# Patient Record
Sex: Female | Born: 1965 | Race: White | Hispanic: No | Marital: Married | Smoking: Never smoker
Health system: Southern US, Community
[De-identification: ages and names within clinical notes are randomized; demographics above are authoritative.]

## PROBLEM LIST (undated history)

## (undated) DIAGNOSIS — D869 Sarcoidosis, unspecified: Secondary | ICD-10-CM

## (undated) DIAGNOSIS — Z9889 Other specified postprocedural states: Secondary | ICD-10-CM

## (undated) DIAGNOSIS — T4145XA Adverse effect of unspecified anesthetic, initial encounter: Secondary | ICD-10-CM

## (undated) DIAGNOSIS — F419 Anxiety disorder, unspecified: Secondary | ICD-10-CM

## (undated) DIAGNOSIS — C801 Malignant (primary) neoplasm, unspecified: Secondary | ICD-10-CM

## (undated) DIAGNOSIS — T8859XA Other complications of anesthesia, initial encounter: Secondary | ICD-10-CM

## (undated) DIAGNOSIS — E039 Hypothyroidism, unspecified: Secondary | ICD-10-CM

## (undated) DIAGNOSIS — R112 Nausea with vomiting, unspecified: Secondary | ICD-10-CM

## (undated) DIAGNOSIS — K219 Gastro-esophageal reflux disease without esophagitis: Secondary | ICD-10-CM

## (undated) DIAGNOSIS — Z87442 Personal history of urinary calculi: Secondary | ICD-10-CM

## (undated) HISTORY — DX: Gastro-esophageal reflux disease without esophagitis: K21.9

## (undated) HISTORY — PX: NECK SURGERY: SHX720

## (undated) HISTORY — PX: TUBAL LIGATION: SHX77

## (undated) HISTORY — PX: SINUSOTOMY: SHX291

## (undated) HISTORY — PX: BLADDER SUSPENSION: SHX72

## (undated) HISTORY — DX: Anxiety disorder, unspecified: F41.9

## (undated) HISTORY — PX: ABDOMINAL HYSTERECTOMY: SHX81

## (undated) HISTORY — PX: OTHER SURGICAL HISTORY: SHX169

---

## 2009-06-18 ENCOUNTER — Emergency Department (HOSPITAL_COMMUNITY): Admission: EM | Admit: 2009-06-18 | Discharge: 2009-06-18 | Payer: Self-pay | Admitting: Emergency Medicine

## 2010-09-28 LAB — URINALYSIS, ROUTINE W REFLEX MICROSCOPIC
Glucose, UA: NEGATIVE mg/dL
Ketones, ur: NEGATIVE mg/dL
Nitrite: NEGATIVE
Specific Gravity, Urine: 1.024 (ref 1.005–1.030)
pH: 6 (ref 5.0–8.0)

## 2011-01-08 ENCOUNTER — Other Ambulatory Visit (HOSPITAL_COMMUNITY): Payer: Worker's Compensation

## 2011-01-11 ENCOUNTER — Inpatient Hospital Stay (HOSPITAL_COMMUNITY)
Admission: RE | Admit: 2011-01-11 | Discharge: 2011-01-12 | DRG: 473 | Disposition: A | Discharge status: Home / Self Care | Payer: Worker's Compensation | Source: Ambulatory Visit | Attending: Neurosurgery | Admitting: Neurosurgery

## 2011-01-11 ENCOUNTER — Ambulatory Visit (HOSPITAL_COMMUNITY): Payer: Worker's Compensation

## 2011-01-11 DIAGNOSIS — M47812 Spondylosis without myelopathy or radiculopathy, cervical region: Principal | ICD-10-CM | POA: Diagnosis present

## 2011-01-11 DIAGNOSIS — M5020 Other cervical disc displacement, unspecified cervical region: Secondary | ICD-10-CM | POA: Diagnosis present

## 2011-01-11 DIAGNOSIS — E669 Obesity, unspecified: Secondary | ICD-10-CM | POA: Diagnosis present

## 2011-01-11 LAB — CBC
HCT: 41.5 % (ref 36.0–46.0)
RBC: 4.59 MIL/uL (ref 3.87–5.11)
RDW: 12.8 % (ref 11.5–15.5)
WBC: 7.5 10*3/uL (ref 4.0–10.5)

## 2011-01-11 LAB — DIFFERENTIAL
Basophils Absolute: 0.1 10*3/uL (ref 0.0–0.1)
Eosinophils Relative: 4 % (ref 0–5)
Lymphocytes Relative: 21 % (ref 12–46)
Neutro Abs: 5.1 10*3/uL (ref 1.7–7.7)
Neutrophils Relative %: 68 % (ref 43–77)

## 2011-01-11 LAB — TYPE AND SCREEN: Antibody Screen: NEGATIVE

## 2011-01-11 LAB — SURGICAL PCR SCREEN: Staphylococcus aureus: NEGATIVE

## 2011-01-11 LAB — ABO/RH: ABO/RH(D): A POS

## 2011-01-14 NOTE — Op Note (Signed)
Tammy Wilson, VALLIN NO.:  [id number]  MEDICAL RECORD NO.:  [id number]  LOCATION:  3524                         FACILITY:  [facility]  PHYSICIAN:  Kathaleen Maser. Tammy Wilson, M.D.    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  PROCEDURE NOTE:  C5-6, C6-7 anterior cervical diskectomy and fusion with allograft and plating.  SURGEON:  Kathaleen Maser. Aizlyn Schifano, MD  ASSISTANTYetta Barre.  ANESTHESIA:  General endotracheal.  INDICATIONS:  Ms. Tammy Wilson in a 45 year old female with history of neck and right upper extremity pain, persistent weakness, failing conservative management.  Workup demonstrates evidence of a rightward C5- 6 and rightward C6-7 disk herniation with associated spondylosis causing compression of the exiting C6 and C7 nerve roots.  The patient has been counseled as to her options.  She decided to proceed with C5-6 and C6-7 anterior diskectomy and fusion with allograft and plating, hopes improving her symptoms.  DESCRIPTION OF PROCEDURE:  The patient was brought to the operating room, placed on the operating table in a supine position.  After adequate level of anesthesia was achieved, the patient was placed supine with the neck slightly extended and held in place with halter traction. The patient's anterior cervical region was prepped and draped sterilely. A 10 blade was used to make a curvilinear skin incision overlying the C6 vertebral level.  This was carried down sharply to the platysma.  The platysma then divided vertically and dissection proceeded along the medial border of the  sternocleidomastoid muscle and carotid sheath. Trachea and esophagus are mobilized, tracked towards the left. Prevertebral fascia stripped off the anterior spinal column.  Longus coli muscle identified bilaterally using electrocautery.  Deep self- retaining retractor was placed.  Intraoperative fluoroscopy was used, levels were confirmed.  Disk spaces at C5-6 and C6-7 then incised with a #15 blade in a rectangular fashion.  Wide disk space clean-out was then achieved using pituitary rongeurs, forward and backward angled Karlin curettes, Kerrison rongeurs, and high-speed drill.  All elements of disk were removed down to the level of the posterior annulus.  Microscope was then brought into the field and used throughout the remainder of the diskectomy. Microscope was brought to the right used at the remainder of diskectomy. The remaining aspects of the annulus and osteophytes were removed using high-speed drill down to the level of the posterior longitudinal ligament.  The posterior longitudinal ligament was then elevated and resected in piecemeal fashion using Kerrison rongeurs.  The underlying thecal sac was then identified.  A wide central decompression was performed by undercutting bodies of C5 and C6.  Decompression was then proceeded out each neural foramen.  Wide anterior foraminotomies were then performed along the course exiting C6 nerve roots bilaterally.  At this point a very thorough diskectomy was achieved.  There is no evidence of injury to thecal sac or nerve roots.  At this point, a very thorough depression  was achieved.  The patient tolerated this well and a decompression then performed at the C6-7 level and again in a similar fashion.  Finally, both levels were of significant disk herniation off to the right side at both C5-6 and C6-7.  At this point, a very thorough decompression had been achieved.  Gelfoam was placed topically for hemostasis, found to be good.  A 6-mm  cornerstone allograft wedge was then packed into place and recessed approximately 1 mm from the anterior cortical margin at both levels.  A 42-mm Atlantis anterior cervical plate was then placed over the C5, C6, and T7 levels.  This attached under fluoroscopic guidance using 13-mm variable angle screws to each at all three levels.  All six screws given final tightening and found to be solid within bone.  Locking screws engaged at all 3 levels.  Final images revealed good position of the bone grafts, hardware at proper operative level with normal alignment of spine.  The wound was then irrigated one final time.  Hemostasis was achieved with bipolar electrocautery.  Wound was then closed in typical fashion.  Steri-Strips and sterile dressings were applied.  There were no complications.  The patient tolerated the procedure well, and she returns to the recovery room postoperatively.          ______________________________ Kathaleen Maser Shelsey Rieth, M.D.     HAP/MEDQ  D:  01/11/2011  T:  01/12/2011  Job:  [id number]  Electronically Signed by Julio Sicks M.D. on 01/14/2011 11:47:41 PM

## 2014-08-16 DIAGNOSIS — N3281 Overactive bladder: Secondary | ICD-10-CM | POA: Insufficient documentation

## 2015-03-06 ENCOUNTER — Other Ambulatory Visit: Payer: Self-pay | Admitting: Surgery

## 2015-04-02 ENCOUNTER — Ambulatory Visit (HOSPITAL_COMMUNITY)
Admission: RE | Admit: 2015-04-02 | Discharge: 2015-04-02 | Disposition: A | Discharge status: Home / Self Care | Payer: BLUE CROSS/BLUE SHIELD | Source: Ambulatory Visit | Attending: Surgery | Admitting: Surgery

## 2015-04-02 ENCOUNTER — Encounter (HOSPITAL_COMMUNITY)
Admission: RE | Admit: 2015-04-02 | Discharge: 2015-04-02 | Disposition: A | Discharge status: Home / Self Care | Payer: BLUE CROSS/BLUE SHIELD | Source: Ambulatory Visit | Attending: Surgery | Admitting: Surgery

## 2015-04-02 ENCOUNTER — Other Ambulatory Visit: Payer: Self-pay

## 2015-04-02 DIAGNOSIS — Z01818 Encounter for other preprocedural examination: Secondary | ICD-10-CM | POA: Diagnosis not present

## 2015-04-02 DIAGNOSIS — Z981 Arthrodesis status: Secondary | ICD-10-CM | POA: Diagnosis not present

## 2015-04-02 DIAGNOSIS — R9431 Abnormal electrocardiogram [ECG] [EKG]: Secondary | ICD-10-CM | POA: Diagnosis not present

## 2015-04-02 DIAGNOSIS — K760 Fatty (change of) liver, not elsewhere classified: Secondary | ICD-10-CM | POA: Diagnosis not present

## 2015-04-02 DIAGNOSIS — Z01810 Encounter for preprocedural cardiovascular examination: Secondary | ICD-10-CM | POA: Insufficient documentation

## 2015-04-07 ENCOUNTER — Encounter: Payer: Self-pay | Admitting: Dietician

## 2015-04-07 ENCOUNTER — Encounter: Payer: BLUE CROSS/BLUE SHIELD | Attending: Surgery | Admitting: Dietician

## 2015-04-07 DIAGNOSIS — Z713 Dietary counseling and surveillance: Secondary | ICD-10-CM | POA: Insufficient documentation

## 2015-04-07 DIAGNOSIS — Z6841 Body Mass Index (BMI) 40.0 and over, adult: Secondary | ICD-10-CM | POA: Diagnosis not present

## 2015-04-07 NOTE — Patient Instructions (Signed)

## 2015-04-07 NOTE — Progress Notes (Signed)
  Pre-Op Assessment Visit:  Pre-Operative Sleeve Gastrectomy Surgery  Medical Nutrition Therapy:  Appt start time: 1120   End time:  0929.  Patient was seen on 04/07/2015 for Pre-Operative Nutrition Assessment. Assessment and letter of approval faxed to [facility] Bariatric Surgery Program coordinator on 04/07/2015.   Preferred Learning Style:   No preference indicated   Learning Readiness:   Ready  Handouts given during visit include:  Pre-Op Goals Bariatric Surgery Protein Shakes   During the appointment today the following Pre-Op Goals were reviewed with the patient: Maintain or lose weight as instructed by your surgeon Make healthy food choices Begin to limit portion sizes Limited concentrated sugars and fried foods Keep fat/sugar in the single digits per serving on   food labels Practice CHEWING your food  (aim for 30 chews per bite or until applesauce consistency) Practice not drinking 15 minutes before, during, and 30 minutes after each meal/snack Avoid all carbonated beverages  Avoid/limit caffeinated beverages  Avoid all sugar-sweetened beverages Consume 3 meals per day; eat every 3-5 hours Make a list of non-food related activities Aim for 64-100 ounces of FLUID daily  Aim for at least 60-80 grams of PROTEIN daily Look for a liquid protein source that contain ?15 g protein and ?5 g carbohydrate  (ex: shakes, drinks, shots)  Patient-Centered Goals: Goals: would like to not buy plus size closed, do water sports  10 level of confidence/10 level of importance scale   Demonstrated degree of understanding via:  Teach Back  Teaching Method Utilized:  Visual Auditory Hands on  Barriers to learning/adherence to lifestyle change: none  Patient to call the [facility] to enroll in Pre-Op and Post-Op Nutrition Education when surgery date is scheduled.

## 2015-04-15 ENCOUNTER — Encounter: Payer: Worker's Compensation | Admitting: Dietician

## 2015-05-05 ENCOUNTER — Encounter: Payer: BLUE CROSS/BLUE SHIELD | Attending: Surgery

## 2015-05-05 DIAGNOSIS — Z6841 Body Mass Index (BMI) 40.0 and over, adult: Secondary | ICD-10-CM | POA: Insufficient documentation

## 2015-05-05 DIAGNOSIS — Z713 Dietary counseling and surveillance: Secondary | ICD-10-CM | POA: Insufficient documentation

## 2015-05-08 NOTE — Progress Notes (Signed)
  Pre-Operative Nutrition Class:  Appt start time: 6838   End time:  1830.  Patient was seen on 05/08/15 for Pre-Operative Bariatric Surgery Education at the [facility].   Surgery date: 06/09/15 Surgery type: Sleeve gastrectomy Start weight at [facility]: 273.5 on 04/07/15 Weight today: 274 lbs  TANITA  BODY COMP RESULTS  05/05/15   BMI (kg/m^2) 44.2   Fat Mass (lbs) 133   Fat Free Mass (lbs) 141   Total Body Water (lbs) 103   Samples given per MNT protocol. Patient educated on appropriate usage: Premier protein shake (vanilla - qty 1) Lot #: [id number] Exp: 03/2016  Unjury protein powder (unflavored - qty 1) Lot #: [id number] Exp: 12/2015  Celebrate Vitamins Multivitamin (orange - qty 1) Lot #: [id number] Exp: 08/2016  Celebrate Vitamins Calcium Citrate (berry - qty 1) Lot #: [id number] Exp: 08/2016  PB2 (chocolate - qty 1) Lot #: N/A Exp: 05/10/15  The following the learning objectives were met by the patient during this course:  Identify Pre-Op Dietary Goals and will begin 2 weeks pre-operatively  Identify appropriate sources of fluids and proteins   State protein recommendations and appropriate sources pre and post-operatively  Identify Post-Operative Dietary Goals and will follow for 2 weeks post-operatively  Identify appropriate multivitamin and calcium sources  Describe the need for physical activity post-operatively and will follow MD recommendations  State when to call healthcare provider regarding medication questions or post-operative complications  Handouts given during class include:  Pre-Op Bariatric Surgery Diet Handout  Protein Shake Handout  Post-Op Bariatric Surgery Nutrition Handout  BELT Program Information Flyer  Support Group Information Flyer  [facility] Bariatric Supplements Price List  Follow-Up Plan: Patient will follow-up at [facility] 2 weeks post operatively for diet advancement per MD.

## 2015-06-04 ENCOUNTER — Other Ambulatory Visit: Payer: Self-pay | Admitting: Surgery

## 2015-06-05 ENCOUNTER — Encounter (HOSPITAL_COMMUNITY): Payer: Self-pay

## 2015-06-05 ENCOUNTER — Other Ambulatory Visit (HOSPITAL_COMMUNITY): Payer: Worker's Compensation

## 2015-06-05 ENCOUNTER — Encounter (HOSPITAL_COMMUNITY)
Admission: RE | Admit: 2015-06-05 | Discharge: 2015-06-05 | Disposition: A | Discharge status: Home / Self Care | Payer: BLUE CROSS/BLUE SHIELD | Source: Ambulatory Visit | Attending: Surgery | Admitting: Surgery

## 2015-06-05 DIAGNOSIS — Z01818 Encounter for other preprocedural examination: Secondary | ICD-10-CM | POA: Insufficient documentation

## 2015-06-05 HISTORY — DX: Other complications of anesthesia, initial encounter: T88.59XA

## 2015-06-05 HISTORY — DX: Malignant (primary) neoplasm, unspecified: C80.1

## 2015-06-05 HISTORY — DX: Personal history of urinary calculi: Z87.442

## 2015-06-05 HISTORY — DX: Adverse effect of unspecified anesthetic, initial encounter: T41.45XA

## 2015-06-05 LAB — COMPREHENSIVE METABOLIC PANEL
ALBUMIN: 3.9 g/dL (ref 3.5–5.0)
ALK PHOS: 53 U/L (ref 38–126)
ALT: 22 U/L (ref 14–54)
ANION GAP: 10 (ref 5–15)
AST: 35 U/L (ref 15–41)
BUN: 18 mg/dL (ref 6–20)
CHLORIDE: 102 mmol/L (ref 101–111)
CO2: 28 mmol/L (ref 22–32)
Calcium: 9.5 mg/dL (ref 8.9–10.3)
Creatinine, Ser: 0.6 mg/dL (ref 0.44–1.00)
GFR calc non Af Amer: >60 mL/min (ref >60)
GLUCOSE: 103 mg/dL — AB (ref 65–99)
POTASSIUM: 4.9 mmol/L (ref 3.5–5.1)
SODIUM: 140 mmol/L (ref 135–145)
Total Bilirubin: 1.1 mg/dL (ref 0.3–1.2)
Total Protein: 7.7 g/dL (ref 6.5–8.1)

## 2015-06-05 LAB — CBC WITH DIFFERENTIAL/PLATELET
Basophils Absolute: 0 10*3/uL (ref 0.0–0.1)
Basophils Relative: 0 %
Eosinophils Absolute: 0.1 10*3/uL (ref 0.0–0.7)
Eosinophils Relative: 1 %
HCT: 42.8 % (ref 36.0–46.0)
HEMOGLOBIN: 13.9 g/dL (ref 12.0–15.0)
LYMPHS ABS: 1.1 10*3/uL (ref 0.7–4.0)
LYMPHS PCT: 16 %
MCH: 30 pg (ref 26.0–34.0)
MCHC: 32.5 g/dL (ref 30.0–36.0)
MCV: 92.4 fL (ref 78.0–100.0)
Monocytes Absolute: 0.6 10*3/uL (ref 0.1–1.0)
Monocytes Relative: 9 %
NEUTROS ABS: 5.3 10*3/uL (ref 1.7–7.7)
NEUTROS PCT: 74 %
PLATELETS: 284 10*3/uL (ref 150–400)
RBC: 4.63 MIL/uL (ref 3.87–5.11)
RDW: 12.9 % (ref 11.5–15.5)
WBC: 7.2 10*3/uL (ref 4.0–10.5)

## 2015-06-05 NOTE — Pre-Procedure Instructions (Addendum)
EKG/ CXR 04-02-15 Epic. 11'14 reports-EKG, Stress, heart cath with chart.

## 2015-06-05 NOTE — Patient Instructions (Addendum)
20 Tammy Wilson  06/05/2015   Your procedure is scheduled on:   06-09-2015 Monday  Enter through [place] and follow signs to [facility] to [place]. Arrive at      0730  AM.  (Limit 1 person with you).  Call this number if you have problems the morning of surgery: [phone]  Or Presurgical Testing [phone] days before.   For Living Will and/or Health Care Power Attorney Forms: please provide copy for your medical record,may bring AM of surgery(Forms should be already notarized -we do not provide this service).(Yes/ will provide AM of surgery. ).  Remember: Follow any bowel prep instructions per MD office. For Cpap use: Bring mask and tubing only.   Do not eat food/ or drink: After Midnight.      Take these medicines the morning of surgery with A SIP OF WATER-   (DO NOT TAKE ANY DIABETIC MEDS AM OF SURGERY) : Omeprazole. Venlafaxine.   Do not wear jewelry, make-up or nail polish.  Do not wear deodorant, lotions, powders, or perfumes.   Do not shave legs and under arms- 48 hours(2 days) prior to first CHG shower.(Shaving face and neck okay.)  Do not bring valuables to the hospital.(Hospital is not responsible for lost valuables).  Contacts, dentures or removable bridgework, body piercing, hair pins may not be worn into surgery.  Leave suitcase in the car. After surgery it may be brought to your room.  For patients admitted to the hospital, checkout time is 11:00 AM the day of discharge.(Restricted visitors-Any Persons displaying flu-like symptoms or illness).    Patients discharged the day of surgery will not be allowed to drive home. Must have responsible person with you x 24 hours once discharged.  Name and phone number of your driver: daughter-Tammy Wilson ,[phone] cell     Please read over the following fact sheets that you were given:  CHG(Chlorhexidine Gluconate 4% Surgical Soap) use.[facility] - Preparing  for Surgery Before surgery, you can play an important role.  Because skin is not sterile, your skin needs to be as free of germs as possible.  You can reduce the number of germs on your skin by washing with CHG (chlorahexidine gluconate) soap before surgery.  CHG is an antiseptic cleaner which kills germs and bonds with the skin to continue killing germs even after washing. Please DO NOT use if you have an allergy to CHG or antibacterial soaps.  If your skin becomes reddened/irritated stop using the CHG and inform your nurse when you arrive at Short Stay. Do not shave (including legs and underarms) for at least 48 hours prior to the first CHG shower.  You may shave your face/neck. Please follow these instructions carefully:  1.  Shower with CHG Soap the night before surgery and the  morning of Surgery.  2.  If you choose to wash your hair, wash your hair first as usual with your  normal  shampoo.  3.  After you shampoo, rinse your hair and body thoroughly to remove the  shampoo.                           4.  Use CHG as you would any other liquid soap.  You can apply chg directly  to the skin and wash  Gently with a scrungie or clean washcloth.  5.  Apply the CHG Soap to your body ONLY FROM THE NECK DOWN.   Do not use on face/ open                           Wound or open sores. Avoid contact with eyes, ears mouth and genitals (private parts).                       Wash face,  Genitals (private parts) with your normal soap.             6.  Wash thoroughly, paying special attention to the area where your surgery  will be performed.  7.  Thoroughly rinse your body with warm water from the neck down.  8.  DO NOT shower/wash with your normal soap after using and rinsing off  the CHG Soap.                9.  Pat yourself dry with a clean towel.            10.  Wear clean pajamas.            11.  Place clean sheets on your bed the night of your first shower and do not  sleep with  pets. Day of Surgery : Do not apply any lotions/deodorants the morning of surgery.  Please wear clean clothes to the hospital/surgery center.  FAILURE TO FOLLOW THESE INSTRUCTIONS MAY RESULT IN THE CANCELLATION OF YOUR SURGERY PATIENT SIGNATURE_________________________________  NURSE SIGNATURE__________________________________  ________________________________________________________________________

## 2015-06-08 NOTE — H&P (Signed)
Tammy Wilson Patient #: [id number] DOB: 1966-01-31 Married / Language: Cleophus Molt / Race: White Female   History of Present Illness  Patient words: Tammy Wilson is a 49 year old female who presents for sleeve gastrectomy.  I performed sleeve gastrectomy on her husband in mid November. She is well aware of the risks and benefits. She has been treated for GER but doesn't have a hiatal hernia on UGI. She is ready sleeve gastrectomy 06/09/15.     Allergies  No Known Drug Allergies11/16/2016 (Marked as Inactive) Dilaudid *ANALGESICS - OPIOID*  Medication History (Tammy Wilson, CMA; 05/14/2015 5:00 PM) OxyCODONE HCl (5MG /5ML Solution, 5-10 Milliliter Oral every four hours, as needed, Taken starting 05/14/2015) Active. Protonix (40MG  Tablet DR, 1 (one) Tablet Oral daily, Taken starting 05/14/2015) Active. Ondansetron (4MG  Tablet Disperse, 1 (one) Tablet Oral every six hours, as needed, Taken starting 05/14/2015) Active. Saxenda (18MG /3ML Soln Pen-inj, Subcutaneous daily) Active. Venlafaxine HCl ER (75MG  Capsule ER 24HR, Oral) Active. Omeprazole (20MG  Tablet DR, Oral) Active. LORazepam (2MG  Tablet, Oral) Active. Medications Reconciled   Weight: 274 lb Height: 66in Body Surface Area: 2.29 m Body Mass Index: 44.22 kg/m   Physical Exam Note: HEENT glasses Neck supple Chest clear Heart SR without murmurs or gallops Abdomen prior pelvic sling earlier this year Ext FROM Neuro alert and oriented x 3; motor and sensory function is grossly intact  Assessment & Plan   Morbid obesity.  For lap sleeve gastrectomy.    Matt B. Hassell Done, MD, [facility] [phone] beeper [phone]  06/08/2015 6:30 PM

## 2015-06-09 ENCOUNTER — Inpatient Hospital Stay (HOSPITAL_COMMUNITY): Payer: BLUE CROSS/BLUE SHIELD | Admitting: Registered Nurse

## 2015-06-09 ENCOUNTER — Inpatient Hospital Stay (HOSPITAL_COMMUNITY)
Admission: RE | Admit: 2015-06-09 | Discharge: 2015-06-11 | DRG: 621 | Disposition: A | Discharge status: Home / Self Care | Payer: BLUE CROSS/BLUE SHIELD | Source: Ambulatory Visit | Attending: Surgery | Admitting: Surgery

## 2015-06-09 ENCOUNTER — Encounter (HOSPITAL_COMMUNITY): Payer: Self-pay | Admitting: *Deleted

## 2015-06-09 ENCOUNTER — Encounter (HOSPITAL_COMMUNITY)
Admission: RE | Disposition: A | Discharge status: Home / Self Care | Payer: Self-pay | Source: Ambulatory Visit | Attending: Surgery

## 2015-06-09 DIAGNOSIS — Z01812 Encounter for preprocedural laboratory examination: Secondary | ICD-10-CM

## 2015-06-09 DIAGNOSIS — R110 Nausea: Secondary | ICD-10-CM | POA: Diagnosis not present

## 2015-06-09 DIAGNOSIS — Z9884 Bariatric surgery status: Secondary | ICD-10-CM

## 2015-06-09 DIAGNOSIS — Z79899 Other long term (current) drug therapy: Secondary | ICD-10-CM | POA: Diagnosis not present

## 2015-06-09 DIAGNOSIS — K219 Gastro-esophageal reflux disease without esophagitis: Secondary | ICD-10-CM | POA: Diagnosis present

## 2015-06-09 DIAGNOSIS — Z6841 Body Mass Index (BMI) 40.0 and over, adult: Secondary | ICD-10-CM

## 2015-06-09 DIAGNOSIS — F419 Anxiety disorder, unspecified: Secondary | ICD-10-CM | POA: Diagnosis present

## 2015-06-09 HISTORY — PX: LAPAROSCOPIC GASTRIC SLEEVE RESECTION: SHX5895

## 2015-06-09 HISTORY — PX: UPPER GI ENDOSCOPY: SHX6162

## 2015-06-09 LAB — CBC
HCT: 42.1 % (ref 36.0–46.0)
Hemoglobin: 13.9 g/dL (ref 12.0–15.0)
MCH: 30.8 pg (ref 26.0–34.0)
MCHC: 33 g/dL (ref 30.0–36.0)
MCV: 93.1 fL (ref 78.0–100.0)
PLATELETS: 229 10*3/uL (ref 150–400)
RBC: 4.52 MIL/uL (ref 3.87–5.11)
RDW: 12.9 % (ref 11.5–15.5)
WBC: 10.5 10*3/uL (ref 4.0–10.5)

## 2015-06-09 LAB — CREATININE, SERUM
CREATININE: 0.59 mg/dL (ref 0.44–1.00)
GFR calc Af Amer: >60 mL/min (ref >60)

## 2015-06-09 SURGERY — GASTRECTOMY, SLEEVE, LAPAROSCOPIC
Anesthesia: General | Site: Esophagus

## 2015-06-09 MED ORDER — LACTATED RINGERS IR SOLN
Status: DC | PRN
  Administered 2015-06-09: 1000 mL

## 2015-06-09 MED ORDER — DEXAMETHASONE SODIUM PHOSPHATE 10 MG/ML IJ SOLN
INTRAMUSCULAR | Status: DC | PRN
  Administered 2015-06-09: 10 mg via INTRAVENOUS

## 2015-06-09 MED ORDER — ROCURONIUM BROMIDE 100 MG/10ML IV SOLN
INTRAVENOUS | Status: AC
  Filled 2015-06-09: qty 1

## 2015-06-09 MED ORDER — ONDANSETRON HCL 4 MG/2ML IJ SOLN
4.0000 mg | INTRAMUSCULAR | Status: DC | PRN
  Administered 2015-06-09 – 2015-06-11 (×5): 4 mg via INTRAVENOUS
  Filled 2015-06-09 (×5): qty 2

## 2015-06-09 MED ORDER — LIDOCAINE HCL (CARDIAC) 20 MG/ML IV SOLN
INTRAVENOUS | Status: DC | PRN
  Administered 2015-06-09: 100 mg via INTRAVENOUS

## 2015-06-09 MED ORDER — SUGAMMADEX SODIUM 500 MG/5ML IV SOLN
INTRAVENOUS | Status: AC
  Filled 2015-06-09: qty 5

## 2015-06-09 MED ORDER — PROPOFOL 10 MG/ML IV BOLUS
INTRAVENOUS | Status: DC | PRN
  Administered 2015-06-09: 240 mg via INTRAVENOUS

## 2015-06-09 MED ORDER — LABETALOL HCL 5 MG/ML IV SOLN
INTRAVENOUS | Status: AC
  Filled 2015-06-09: qty 4

## 2015-06-09 MED ORDER — MEPERIDINE HCL 50 MG/ML IJ SOLN: 6.2500 mg | INTRAMUSCULAR | Status: DC | PRN

## 2015-06-09 MED ORDER — SODIUM CHLORIDE 0.9 % IJ SOLN
INTRAMUSCULAR | Status: DC | PRN
  Administered 2015-06-09: 20 mL

## 2015-06-09 MED ORDER — PROPOFOL 10 MG/ML IV BOLUS
INTRAVENOUS | Status: AC
  Filled 2015-06-09: qty 40

## 2015-06-09 MED ORDER — EPHEDRINE SULFATE 50 MG/ML IJ SOLN
INTRAMUSCULAR | Status: DC | PRN
  Administered 2015-06-09: 5 mg via INTRAVENOUS

## 2015-06-09 MED ORDER — STERILE WATER FOR IRRIGATION IR SOLN
Status: DC | PRN
  Administered 2015-06-09: 1000 mL

## 2015-06-09 MED ORDER — PROMETHAZINE HCL 25 MG/ML IJ SOLN
25.0000 mg | Freq: Four times a day (QID) | INTRAMUSCULAR | Status: DC | PRN
  Administered 2015-06-09 – 2015-06-10 (×2): 25 mg via INTRAVENOUS
  Filled 2015-06-09 (×2): qty 1

## 2015-06-09 MED ORDER — SUGAMMADEX SODIUM 500 MG/5ML IV SOLN
INTRAVENOUS | Status: DC | PRN
  Administered 2015-06-09: 250 mg via INTRAVENOUS

## 2015-06-09 MED ORDER — FENTANYL CITRATE (PF) 100 MCG/2ML IJ SOLN
25.0000 ug | INTRAMUSCULAR | Status: DC | PRN
  Administered 2015-06-09: 50 ug via INTRAVENOUS

## 2015-06-09 MED ORDER — ONDANSETRON HCL 4 MG/2ML IJ SOLN
INTRAMUSCULAR | Status: AC
  Filled 2015-06-09: qty 2

## 2015-06-09 MED ORDER — MIDAZOLAM HCL 2 MG/2ML IJ SOLN
INTRAMUSCULAR | Status: AC
Start: 2015-06-09 — End: 2015-06-09
  Filled 2015-06-09: qty 2

## 2015-06-09 MED ORDER — MORPHINE SULFATE (PF) 2 MG/ML IV SOLN
2.0000 mg | INTRAVENOUS | Status: DC | PRN
  Administered 2015-06-09 (×3): 2 mg via INTRAVENOUS
  Filled 2015-06-09 (×3): qty 1

## 2015-06-09 MED ORDER — PROMETHAZINE HCL 25 MG/ML IJ SOLN
6.2500 mg | INTRAMUSCULAR | Status: DC | PRN
  Administered 2015-06-09: 6.25 mg via INTRAVENOUS

## 2015-06-09 MED ORDER — LIDOCAINE HCL (CARDIAC) 20 MG/ML IV SOLN
INTRAVENOUS | Status: AC
Start: 2015-06-09 — End: 2015-06-09
  Filled 2015-06-09: qty 5

## 2015-06-09 MED ORDER — FENTANYL CITRATE (PF) 100 MCG/2ML IJ SOLN
INTRAMUSCULAR | Status: AC
  Filled 2015-06-09: qty 2

## 2015-06-09 MED ORDER — SODIUM CHLORIDE 0.9 % IJ SOLN
INTRAMUSCULAR | Status: AC
  Filled 2015-06-09: qty 20

## 2015-06-09 MED ORDER — FENTANYL CITRATE (PF) 100 MCG/2ML IJ SOLN
INTRAMUSCULAR | Status: DC | PRN
  Administered 2015-06-09 (×7): 50 ug via INTRAVENOUS

## 2015-06-09 MED ORDER — 0.9 % SODIUM CHLORIDE (POUR BTL) OPTIME
TOPICAL | Status: DC | PRN
  Administered 2015-06-09: 1000 mL

## 2015-06-09 MED ORDER — ACETAMINOPHEN 160 MG/5ML PO SOLN
325.0000 mg | ORAL | Status: DC | PRN
  Administered 2015-06-10 – 2015-06-11 (×3): 325 mg via ORAL
  Filled 2015-06-09 (×3): qty 20.3

## 2015-06-09 MED ORDER — LABETALOL HCL 5 MG/ML IV SOLN
5.0000 mg | INTRAVENOUS | Status: AC | PRN
  Administered 2015-06-09 (×3): 5 mg via INTRAVENOUS

## 2015-06-09 MED ORDER — ROCURONIUM BROMIDE 100 MG/10ML IV SOLN
INTRAVENOUS | Status: DC | PRN
  Administered 2015-06-09: 50 mg via INTRAVENOUS
  Administered 2015-06-09: 10 mg via INTRAVENOUS

## 2015-06-09 MED ORDER — HEPARIN SODIUM (PORCINE) 5000 UNIT/ML IJ SOLN
5000.0000 [IU] | INTRAMUSCULAR | Status: AC
  Administered 2015-06-09: 5000 [IU] via SUBCUTANEOUS
  Filled 2015-06-09: qty 1

## 2015-06-09 MED ORDER — OXYCODONE HCL 5 MG/5ML PO SOLN
5.0000 mg | ORAL | Status: DC | PRN
  Administered 2015-06-10 – 2015-06-11 (×3): 5 mg via ORAL
  Filled 2015-06-09 (×4): qty 5

## 2015-06-09 MED ORDER — LACTATED RINGERS IV SOLN
INTRAVENOUS | Status: DC
  Administered 2015-06-09: 1000 mL via INTRAVENOUS

## 2015-06-09 MED ORDER — CEFOTETAN DISODIUM-DEXTROSE 2-2.08 GM-% IV SOLR
INTRAVENOUS | Status: AC
  Filled 2015-06-09: qty 50

## 2015-06-09 MED ORDER — DEXAMETHASONE SODIUM PHOSPHATE 10 MG/ML IJ SOLN
INTRAMUSCULAR | Status: AC
  Filled 2015-06-09: qty 1

## 2015-06-09 MED ORDER — HEPARIN SODIUM (PORCINE) 5000 UNIT/ML IJ SOLN
5000.0000 [IU] | Freq: Three times a day (TID) | INTRAMUSCULAR | Status: DC
  Administered 2015-06-09 – 2015-06-11 (×5): 5000 [IU] via SUBCUTANEOUS
  Filled 2015-06-09 (×8): qty 1

## 2015-06-09 MED ORDER — PANTOPRAZOLE SODIUM 40 MG IV SOLR
40.0000 mg | Freq: Every day | INTRAVENOUS | Status: DC
  Administered 2015-06-09 – 2015-06-10 (×2): 40 mg via INTRAVENOUS
  Filled 2015-06-09 (×3): qty 40

## 2015-06-09 MED ORDER — FENTANYL CITRATE (PF) 250 MCG/5ML IJ SOLN
INTRAMUSCULAR | Status: AC
  Filled 2015-06-09: qty 5

## 2015-06-09 MED ORDER — CEFOTETAN DISODIUM-DEXTROSE 2-2.08 GM-% IV SOLR
2.0000 g | INTRAVENOUS | Status: AC
  Administered 2015-06-09: 2 g via INTRAVENOUS

## 2015-06-09 MED ORDER — POTASSIUM CHLORIDE IN NACL 20-0.45 MEQ/L-% IV SOLN
INTRAVENOUS | Status: DC
  Administered 2015-06-09: 125 mL/h via INTRAVENOUS
  Administered 2015-06-09 – 2015-06-10 (×3): via INTRAVENOUS
  Administered 2015-06-11: 125 mL via INTRAVENOUS
  Filled 2015-06-09 (×7): qty 1000

## 2015-06-09 MED ORDER — LACTATED RINGERS IV SOLN
INTRAVENOUS | Status: DC
Start: 2015-06-09 — End: 2015-06-09

## 2015-06-09 MED ORDER — PREMIER PROTEIN SHAKE
2.0000 [oz_av] | Freq: Four times a day (QID) | ORAL | Status: DC
  Administered 2015-06-11: 2 [oz_av] via ORAL

## 2015-06-09 MED ORDER — BUPIVACAINE LIPOSOME 1.3 % IJ SUSP
20.0000 mL | Freq: Once | INTRAMUSCULAR | Status: AC
  Administered 2015-06-09: 20 mL
  Filled 2015-06-09: qty 20

## 2015-06-09 MED ORDER — PROMETHAZINE HCL 25 MG/ML IJ SOLN
INTRAMUSCULAR | Status: AC
  Filled 2015-06-09: qty 1

## 2015-06-09 MED ORDER — ACETAMINOPHEN 160 MG/5ML PO SOLN: 650.0000 mg | ORAL | Status: DC | PRN

## 2015-06-09 MED ORDER — MIDAZOLAM HCL 5 MG/5ML IJ SOLN
INTRAMUSCULAR | Status: DC | PRN
  Administered 2015-06-09: 2 mg via INTRAVENOUS

## 2015-06-09 SURGICAL SUPPLY — 62 items
APPLICATOR COTTON TIP 6IN STRL (MISCELLANEOUS) ×6 IMPLANT
APPLIER CLIP 5 13 M/L LIGAMAX5 (MISCELLANEOUS)
APPLIER CLIP ROT 10 11.4 M/L (STAPLE)
APPLIER CLIP ROT 13.4 12 LRG (CLIP)
BLADE SURG 15 STRL LF DISP TIS (BLADE) ×2 IMPLANT
BLADE SURG 15 STRL SS (BLADE) ×1
CABLE HIGH FREQUENCY MONO STRZ (ELECTRODE) ×3 IMPLANT
CLIP APPLIE 5 13 M/L LIGAMAX5 (MISCELLANEOUS) IMPLANT
CLIP APPLIE ROT 10 11.4 M/L (STAPLE) IMPLANT
CLIP APPLIE ROT 13.4 12 LRG (CLIP) IMPLANT
COVER SURGICAL LIGHT HANDLE (MISCELLANEOUS) ×3 IMPLANT
DEVICE SUT QUICK LOAD TK 5 (STAPLE) IMPLANT
DEVICE SUT TI-KNOT TK 5X26 (MISCELLANEOUS) IMPLANT
DEVICE SUTURE ENDOST 10MM (ENDOMECHANICALS) IMPLANT
DEVICE TROCAR PUNCTURE CLOSURE (ENDOMECHANICALS) ×3 IMPLANT
DISSECTOR BLUNT TIP ENDO 5MM (MISCELLANEOUS) ×3 IMPLANT
DRAPE CAMERA CLOSED 9X96 (DRAPES) ×3 IMPLANT
ELECT REM PT RETURN 9FT ADLT (ELECTROSURGICAL) ×3
ELECTRODE REM PT RTRN 9FT ADLT (ELECTROSURGICAL) ×2 IMPLANT
GAUZE SPONGE 4X4 12PLY STRL (GAUZE/BANDAGES/DRESSINGS) IMPLANT
GLOVE BIOGEL M 8.0 STRL (GLOVE) ×3 IMPLANT
GOWN STRL REUS W/TWL XL LVL3 (GOWN DISPOSABLE) ×12 IMPLANT
HANDLE STAPLE EGIA 4 XL (STAPLE) ×3 IMPLANT
HOVERMATT SINGLE USE (MISCELLANEOUS) ×3 IMPLANT
KIT BASIN OR (CUSTOM PROCEDURE TRAY) ×3 IMPLANT
LIQUID BAND (GAUZE/BANDAGES/DRESSINGS) ×3 IMPLANT
NEEDLE SPNL 22GX3.5 QUINCKE BK (NEEDLE) ×3 IMPLANT
PACK UNIVERSAL I (CUSTOM PROCEDURE TRAY) ×3 IMPLANT
PEN SKIN MARKING BROAD (MISCELLANEOUS) ×6 IMPLANT
POUCH SPECIMEN RETRIEVAL 10MM (ENDOMECHANICALS) ×3 IMPLANT
RELOAD TRI 45 ART MED THCK BLK (STAPLE) ×3 IMPLANT
RELOAD TRI 45 ART MED THCK PUR (STAPLE) IMPLANT
RELOAD TRI 60 ART MED THCK BLK (STAPLE) ×3 IMPLANT
RELOAD TRI 60 ART MED THCK PUR (STAPLE) ×12 IMPLANT
SCISSORS LAP 5X45 EPIX DISP (ENDOMECHANICALS) IMPLANT
SCRUB PCMX 4 OZ (MISCELLANEOUS) ×6 IMPLANT
SEALANT SURGICAL APPL DUAL CAN (MISCELLANEOUS) IMPLANT
SET IRRIG TUBING LAPAROSCOPIC (IRRIGATION / IRRIGATOR) ×3 IMPLANT
SHEARS HARMONIC ACE PLUS 45CM (MISCELLANEOUS) ×3 IMPLANT
SLEEVE ADV FIXATION 5X100MM (TROCAR) ×6 IMPLANT
SLEEVE GASTRECTOMY 36FR VISIGI (MISCELLANEOUS) ×3 IMPLANT
SOLUTION ANTI FOG 6CC (MISCELLANEOUS) ×3 IMPLANT
SPONGE LAP 18X18 X RAY DECT (DISPOSABLE) ×3 IMPLANT
STAPLER VISISTAT 35W (STAPLE) ×3 IMPLANT
SUT SURGIDAC NAB ES-9 0 48 120 (SUTURE) IMPLANT
SUT VIC AB 4-0 SH 18 (SUTURE) ×3 IMPLANT
SUT VICRYL 0 TIES 12 18 (SUTURE) ×3 IMPLANT
SYR 10ML ECCENTRIC (SYRINGE) ×3 IMPLANT
SYR 20CC LL (SYRINGE) ×3 IMPLANT
SYR 50ML LL SCALE MARK (SYRINGE) ×3 IMPLANT
TOWEL OR 17X26 10 PK STRL BLUE (TOWEL DISPOSABLE) ×6 IMPLANT
TOWEL OR NON WOVEN STRL DISP B (DISPOSABLE) ×3 IMPLANT
TRAY FOLEY W/METER SILVER 14FR (SET/KITS/TRAYS/PACK) IMPLANT
TRAY FOLEY W/METER SILVER 16FR (SET/KITS/TRAYS/PACK) IMPLANT
TROCAR ADV FIXATION 12X100MM (TROCAR) ×3 IMPLANT
TROCAR ADV FIXATION 5X100MM (TROCAR) ×3 IMPLANT
TROCAR BLADELESS 15MM (ENDOMECHANICALS) ×3 IMPLANT
TROCAR BLADELESS OPT 5 100 (ENDOMECHANICALS) ×3 IMPLANT
TUBE CALIBRATION LAPBAND (TUBING) ×3 IMPLANT
TUBING CONNECTING 10 (TUBING) ×3 IMPLANT
TUBING ENDO SMARTCAP (MISCELLANEOUS) ×3 IMPLANT
TUBING FILTER THERMOFLATOR (ELECTROSURGICAL) ×3 IMPLANT

## 2015-06-09 NOTE — Anesthesia Preprocedure Evaluation (Addendum)
Anesthesia Evaluation  Patient identified by MRN, date of birth, ID band Patient awake    Reviewed: Allergy & Precautions, NPO status , Patient's Chart, lab work & pertinent test results  History of Anesthesia Complications (+) history of anesthetic complications  Airway Mallampati: III       Dental  (+) Teeth Intact   Pulmonary neg pulmonary ROS,    breath sounds clear to auscultation       Cardiovascular negative cardio ROS   Rhythm:Regular Rate:Normal     Neuro/Psych PSYCHIATRIC DISORDERS Anxiety negative neurological ROS     GI/Hepatic Neg liver ROS, GERD  Medicated,  Endo/Other  negative endocrine ROS  Renal/GU negative Renal ROS  negative genitourinary   Musculoskeletal negative musculoskeletal ROS (+)   Abdominal   Peds negative pediatric ROS (+)  Hematology negative hematology ROS (+)   Anesthesia Other Findings   Reproductive/Obstetrics negative OB ROS                           Lab Results  Component Value Date   WBC 7.2 06/05/2015   HGB 13.9 06/05/2015   HCT 42.8 06/05/2015   MCV 92.4 06/05/2015   PLT 284 06/05/2015   Lab Results  Component Value Date   CREATININE 0.60 06/05/2015   BUN 18 06/05/2015   NA 140 06/05/2015   K 4.9 06/05/2015   CL 102 06/05/2015   CO2 28 06/05/2015   No results found for: INR, PROTIME  EKG: normal sinus rhythm, prolonged QT interval.   Anesthesia Physical Anesthesia Plan  ASA: III  Anesthesia Plan: General   Post-op Pain Management:    Induction: Intravenous, Rapid sequence and Cricoid pressure planned  Airway Management Planned: Oral ETT  Additional Equipment:   Intra-op Plan:   Post-operative Plan: Extubation in OR  Informed Consent: I have reviewed the patients History and Physical, chart, labs and discussed the procedure including the risks, benefits and alternatives for the proposed anesthesia with the patient or  authorized representative who has indicated his/her understanding and acceptance.   Dental advisory given  Plan Discussed with: CRNA  Anesthesia Plan Comments:        Anesthesia Quick Evaluation

## 2015-06-09 NOTE — Anesthesia Procedure Notes (Signed)
Procedure Name: Intubation Date/Time: 06/09/2015 10:26 AM Performed by: Carleene Cooper A Pre-anesthesia Checklist: Patient identified, Emergency Drugs available, Suction available, Patient being monitored and Timeout performed Oxygen Delivery Method: Circle system utilized Preoxygenation: Pre-oxygenation with 100% oxygen (Cricoid pressure by Dr. Smith Robert) Intubation Type: IV induction, Cricoid Pressure applied and Rapid sequence Laryngoscope Size: Mac and 4 Grade View: Grade I Tube type: Oral Tube size: 7.5 mm Number of attempts: 2 Airway Equipment and Method: Stylet Placement Confirmation: ETT inserted through vocal cords under direct vision,  positive ETCO2 and breath sounds checked- equal and bilateral Secured at: 21 cm Tube secured with: Tape Dental Injury: Teeth and Oropharynx as per pre-operative assessment

## 2015-06-09 NOTE — Op Note (Signed)
Tammy Wilson [id number] 04-24-1966 06/09/2015  Preoperative diagnosis: morbid obesity  Postoperative diagnosis: Same   Procedure: upper endoscopy   Surgeon: Leighton Ruff. Kymora Sciara M.D., FACS   Anesthesia: Gen.   Indications for procedure: 49 y.o. year old female undergoing Laparoscopic Gastric Sleeve Resection and an EGD was requested to evaluate the new gastric sleeve.   Description of procedure: After we have completed the sleeve resection, I scrubbed out and obtained the Olympus endoscope. I gently placed endoscope in the patient's oropharynx and gently glided it down the esophagus without any difficulty under direct visualization. Once I was in the gastric sleeve, I insufflated the stomach with air. I was able to cannulate and advanced the scope through the gastric sleeve. I was able to cannulate the duodenum with ease. [name] had placed saline in the upper abdomen. Upon further insufflation of the gastric sleeve there was no evidence of bubbles. GE junction located at 40 cm.  Upon further inspection of the gastric sleeve, the mucosa appeared normal. There is no evidence of any mucosal abnormality. The sleeve was widely patent at the angularis. There was no evidence of bleeding. The gastric sleeve was decompressed. The scope was withdrawn. The patient tolerated this portion of the procedure well. Please see Dr Earlie Server operative note for details regarding the laparoscopic gastric sleeve resection.   Leighton Ruff. Redmond Pulling, MD, FACS  General, Bariatric, & Minimally Invasive Surgery  [facility], [place]

## 2015-06-09 NOTE — Interval H&P Note (Signed)
History and Physical Interval Note:  06/09/2015 9:45 AM  Tammy Wilson  has presented today for surgery, with the diagnosis of Morbid Obesity  The various methods of treatment have been discussed with the patient and family. After consideration of risks, benefits and other options for treatment, the patient has consented to  Procedure(s): LAPAROSCOPIC GASTRIC SLEEVE RESECTION (N/A) as a surgical intervention .  The patient's history has been reviewed, patient examined, no change in status, stable for surgery.  I have reviewed the patient's chart and labs.  Questions were answered to the patient's satisfaction.     Katelyn Broadnax B

## 2015-06-09 NOTE — Anesthesia Postprocedure Evaluation (Signed)
Anesthesia Post Note  Patient: Tammy Wilson  Procedure(s) Performed: Procedure(s) (LRB): LAPAROSCOPIC GASTRIC SLEEVE RESECTION (N/A) UPPER GI ENDOSCOPY (N/A)  Patient location during evaluation: PACU Anesthesia Type: General Level of consciousness: awake and alert Pain management: pain level controlled Vital Signs Assessment: post-procedure vital signs reviewed and stable Respiratory status: spontaneous breathing, nonlabored ventilation, respiratory function stable and patient connected to nasal cannula oxygen Cardiovascular status: blood pressure returned to baseline and stable Postop Assessment: no signs of nausea or vomiting Anesthetic complications: no Comments: Elevated BP despite medical intervention, denies pain. Recommend PCP follow up for uncontrolled HTN.     Last Vitals:  Filed Vitals:   06/09/15 1345 06/09/15 1400  BP: 170/100 154/98  Pulse: 98 99  Temp: 36.8 C 36.6 C  Resp: 16 16    Last Pain:  Filed Vitals:   06/09/15 1404  PainSc: [name]

## 2015-06-09 NOTE — Transfer of Care (Signed)
Immediate Anesthesia Transfer of Care Note  Patient: Tammy Wilson  Procedure(s) Performed: Procedure(s): LAPAROSCOPIC GASTRIC SLEEVE RESECTION (N/A) UPPER GI ENDOSCOPY (N/A)  Patient Location: PACU  Anesthesia Type:General  Level of Consciousness: awake, alert , oriented and patient cooperative  Airway & Oxygen Therapy: Patient Spontanous Breathing and Patient connected to face mask oxygen  Post-op Assessment: Report given to RN, Post -op Vital signs reviewed and stable and Patient moving all extremities  Post vital signs: Reviewed and stable  Last Vitals:  Filed Vitals:   06/09/15 0744  BP: 135/79  Pulse: 80  Temp: 36.7 C  Resp: 18    Complications: No apparent anesthesia complications

## 2015-06-09 NOTE — Op Note (Signed)
Surgeon: Kaylyn Lim, MD, FACS  Asst:  Greer Pickerel, MD,FACS  Anes:  General endotracheal  Procedure: Laparoscopic sleeve gastrectomy and upper endoscopy  Diagnosis: Morbid obesity  Complications: None noted  EBL:   8 cc  Description of Procedure:  The patient was take to OR 4 and given general anesthesia.  The abdomen was prepped with PCMX and draped sterilely.  A timeout was performed.  Access to the abdomen was achieved with with a 5 mm Optiview through the left upper quadrant.  Following insufflation, the state of the abdomen was found to be with a few adhesions in the right upper quadrant.  The balloon test was performed and was negative with 10 cc for any evidence of a hiatal hernia.  No dimple was seen.    The ViSiGi 36Fr tube was inserted to deflate the stomach and was pulled back into the esophagus.    The pylorus was identified and we measured 5 cm back and marked the antrum.  At that point we began dissection to take down the greater curvature of the stomach using the Harmonic scalpel.  This dissection was taken all the way up to the left crus.  Posterior attachments of the stomach were also taken down.    The ViSiGi tube was then passed into the antrum and suction applied so that it was snug along the lessor curvature.  The "crow's foot" or incisura was identified.  The sleeve gastrectomy was begun using the [employer] stapler beginning with a 4.5 cm black load Covidien with TRS followed by another 6 cm load with TRS and then multiple firings with purple loads and TRS.  When the sleeve was complete the tube was taken off suction and insufflated briefly.  The tube was withdrawn.  Upper endoscopy was then performed by Dr. Redmond Pulling and no bubbles or bleeding were seen.     The specimen was extracted through the 15 trocar site.  Wounds were infiltrated with Exparel and closed with 4-0 vicryl.    Matt B. Hassell Done, [place], [facility], [place]

## 2015-06-10 LAB — CBC WITH DIFFERENTIAL/PLATELET
BASOS ABS: 0 10*3/uL (ref 0.0–0.1)
Basophils Relative: 0 %
EOS PCT: 0 %
Eosinophils Absolute: 0 10*3/uL (ref 0.0–0.7)
HEMATOCRIT: 41.3 % (ref 36.0–46.0)
Hemoglobin: 13.7 g/dL (ref 12.0–15.0)
LYMPHS ABS: 1.3 10*3/uL (ref 0.7–4.0)
LYMPHS PCT: 12 %
MCH: 30 pg (ref 26.0–34.0)
MCHC: 33.2 g/dL (ref 30.0–36.0)
MCV: 90.6 fL (ref 78.0–100.0)
MONO ABS: 1 10*3/uL (ref 0.1–1.0)
MONOS PCT: 10 %
NEUTROS ABS: 8.3 10*3/uL — AB (ref 1.7–7.7)
Neutrophils Relative %: 78 %
PLATELETS: 273 10*3/uL (ref 150–400)
RBC: 4.56 MIL/uL (ref 3.87–5.11)
RDW: 12.8 % (ref 11.5–15.5)
WBC: 10.7 10*3/uL — ABNORMAL HIGH (ref 4.0–10.5)

## 2015-06-10 LAB — HEMOGLOBIN AND HEMATOCRIT, BLOOD
HCT: 42.3 % (ref 36.0–46.0)
Hemoglobin: 13.9 g/dL (ref 12.0–15.0)

## 2015-06-10 MED ORDER — PROMETHAZINE HCL 25 MG RE SUPP
25.0000 mg | Freq: Four times a day (QID) | RECTAL | Status: DC | PRN
  Administered 2015-06-10: 25 mg via RECTAL
  Filled 2015-06-10: qty 1

## 2015-06-10 MED ORDER — LORAZEPAM 2 MG/ML IJ SOLN
0.5000 mg | Freq: Three times a day (TID) | INTRAMUSCULAR | Status: DC | PRN
  Administered 2015-06-10: 0.5 mg via INTRAVENOUS
  Filled 2015-06-10: qty 1

## 2015-06-10 NOTE — Progress Notes (Signed)
Patient reports she feels like she's having an  " anxiety attack" and requesting lorazepam ,she reports she takes lorazepam tablets 0.5mg   # times a day as needed at home.Patient is NPO. D Newman,MD notified and order received for lorazepam IV .

## 2015-06-10 NOTE — Plan of Care (Signed)
Problem: Food- and Nutrition-Related Knowledge Deficit (NB-1.1) Goal: Nutrition education Formal process to instruct or train a patient/client in a skill or to impart knowledge to help patients/clients voluntarily manage or modify food choices and eating behavior to maintain or improve health. Outcome: Completed/Met Date Met:  06/10/15 Nutrition Education Note  Received consult for diet education per DROP protocol.   Discussed 2 week post op diet with pt. Emphasized that liquids must be non carbonated, non caffeinated, and sugar free. Fluid goals discussed. Pt to follow up with outpatient bariatric RD for further diet progression after 2 weeks. Multivitamins and minerals also reviewed. Teach back method used, pt expressed understanding, expect good compliance.   Diet: First 2 Weeks  You will see the nutritionist about two (2) weeks after your surgery. The nutritionist will increase the types of foods you can eat if you are handling liquids well:  If you have severe vomiting or nausea and cannot handle clear liquids lasting longer than 1 day, call your surgeon  Protein Shake  Drink at least 2 ounces of shake 5-6 times per day  Each serving of protein shakes (usually 8 - 12 ounces) should have a minimum of:  15 grams of protein  And no more than 5 grams of carbohydrate  Goal for protein each day:  Men = 80 grams per day  Women = 60 grams per day  Protein powder may be added to fluids such as non-fat milk or Lactaid milk or Soy milk (limit to 35 grams added protein powder per serving)   Hydration  Slowly increase the amount of water and other clear liquids as tolerated (See Acceptable Fluids)  Slowly increase the amount of protein shake as tolerated  Sip fluids slowly and throughout the day  May use sugar substitutes in small amounts (no more than 6 - 8 packets per day; i.e. Splenda)   Fluid Goal  The first goal is to drink at least 8 ounces of protein shake/drink per day (or as directed  by the nutritionist); some examples of protein shakes are [employer], [employer], EAS Edge HP, and Unjury. See handout from pre-op Bariatric Education Class:  Slowly increase the amount of protein shake you drink as tolerated  You may find it easier to slowly sip shakes throughout the day  It is important to get your proteins in first  Your fluid goal is to drink 64 - 100 ounces of fluid daily  It may take a few weeks to build up to this  32 oz (or more) should be clear liquids  And  32 oz (or more) should be full liquids (see below for examples)  Liquids should not contain sugar, caffeine, or carbonation   Clear Liquids:  Water or Sugar-free flavored water (i.e. Fruit H2O, Propel)  Decaffeinated coffee or tea (sugar-free)  Crystal Lite, Wyler's Lite, Minute Maid Lite  Sugar-free Jell-O  Bouillon or broth  Sugar-free Popsicle: *Less than 20 calories each; Limit 1 per day   Full Liquids:  Protein Shakes/Drinks + 2 choices per day of other full liquids  Full liquids must be:  No More Than 12 grams of Carbs per serving  No More Than 3 grams of Fat per serving  Strained low-fat cream soup  Non-Fat milk  Fat-free Lactaid Milk  Sugar-free yogurt (Dannon Lite & Fit, Greek yogurt)     Clayton Bibles, MS, RD, LDN Pager: [phone] After Hours Pager: [phone]

## 2015-06-10 NOTE — Progress Notes (Signed)
Patient continues to have nausea and vomiting. MD called and notified of N/V. Received verbal order from [name] for phenergan suppository 25mg  PRN q6hrs. Order placed. Will continue to monitor patient.

## 2015-06-10 NOTE — Progress Notes (Signed)
Patient alert and oriented, Post op day 1.  Provided support and encouragement.  Encouraged pulmonary toilet, ambulation and small sips of liquids.  All questions answered.  Will continue to monitor. 

## 2015-06-10 NOTE — Care Management Note (Signed)
Case Management Note  Patient Details  Name: Tammy Wilson MRN: [id number] Date of Birth: 15-Jul-1965  Subjective/Objective:    S/p Laparoscopic Gastric Sleeve Resection and an EGD                 Action/Plan: Discharge planning, no HH needs identified  Expected Discharge Date:  06/11/15               Expected Discharge Plan:  Home/Self Care  In-House Referral:  NA  Discharge planning Services  CM Consult  Post Acute Care Choice:  NA Choice offered to:  NA  DME Arranged:  N/A DME Agency:  NA  HH Arranged:  NA HH Agency:  NA  Status of Service:  Completed, signed off  Medicare Important Message Given:    Date Medicare IM Given:    Medicare IM give by:    Date Additional Medicare IM Given:    Additional Medicare Important Message give by:     If discussed at [place] of Stay Meetings, dates discussed:    Additional Comments:  Guadalupe Maple, RN 06/10/2015, 1:30 PM

## 2015-06-10 NOTE — Progress Notes (Signed)
Patient ID: Tammy Wilson, female   DOB: Feb 25, 1966, 49 y.o.   MRN: [id number] [facility] Progress Note:   1 Day Post-Op  Subjective: Mental status is clear.  Minimal pain.  Had more nausea today Objective: Vital signs in last 24 hours: Temp:  [98 F (36.7 C)-98.4 F (36.9 C)] 98.2 F (36.8 C) (12/13 1024) Pulse Rate:  [68-93] 79 (12/13 1024) Resp:  [14-18] 16 (12/13 1024) BP: (103-181)/(51-99) 181/93 mmHg (12/13 1024) SpO2:  [95 %-100 %] 95 % (12/13 1024)  Intake/Output from previous day: 12/12 0701 - 12/13 0700 In: 3288.8 [I.V.:3288.8] Out: 2225 [Urine:2200; Blood:25] Intake/Output this shift: Total I/O In: 60 [P.O.:60] Out: 1350 [Urine:1350]  Physical Exam: Work of breathing is normal.  Incisions ok  Lab Results:  Results for orders placed or performed during the hospital encounter of 06/09/15 (from the past 48 hour(s))  CBC     Status: None   Collection Time: 06/09/15  1:36 PM  Result Value Ref Range   WBC 10.5 4.0 - 10.5 K/uL   RBC 4.52 3.87 - 5.11 MIL/uL   Hemoglobin 13.9 12.0 - 15.0 g/dL   HCT 42.1 36.0 - 46.0 %   MCV 93.1 78.0 - 100.0 fL   MCH 30.8 26.0 - 34.0 pg   MCHC 33.0 30.0 - 36.0 g/dL   RDW 12.9 11.5 - 15.5 %   Platelets 229 150 - 400 K/uL  Creatinine, serum     Status: None   Collection Time: 06/09/15  1:36 PM  Result Value Ref Range   Creatinine, Ser 0.59 0.44 - 1.00 mg/dL   GFR calc non Af Amer >60 >60 mL/min   GFR calc Af Amer >60 >60 mL/min    Comment: (NOTE) The eGFR has been calculated using the CKD EPI equation. This calculation has not been validated in all clinical situations. eGFR's persistently <60 mL/min signify possible Chronic Kidney Disease.   CBC WITH DIFFERENTIAL     Status: Abnormal   Collection Time: 06/10/15  6:00 AM  Result Value Ref Range   WBC 10.7 (H) 4.0 - 10.5 K/uL   RBC 4.56 3.87 - 5.11 MIL/uL   Hemoglobin 13.7 12.0 - 15.0 g/dL   HCT 41.3 36.0 - 46.0 %   MCV 90.6 78.0 - 100.0 fL   MCH 30.0 26.0 -  34.0 pg   MCHC 33.2 30.0 - 36.0 g/dL   RDW 12.8 11.5 - 15.5 %   Platelets 273 150 - 400 K/uL   Neutrophils Relative % 78 %   Neutro Abs 8.3 (H) 1.7 - 7.7 K/uL   Lymphocytes Relative 12 %   Lymphs Abs 1.3 0.7 - 4.0 K/uL   Monocytes Relative 10 %   Monocytes Absolute 1.0 0.1 - 1.0 K/uL   Eosinophils Relative 0 %   Eosinophils Absolute 0.0 0.0 - 0.7 K/uL   Basophils Relative 0 %   Basophils Absolute 0.0 0.0 - 0.1 K/uL    Radiology/Results: No results found.  Anti-infectives: Anti-infectives    Start     Dose/Rate Route Frequency Ordered Stop   06/09/15 0748  cefoTEtan in Dextrose 5% (CEFOTAN) IVPB 2 g     2 g Intravenous On call to O.R. 06/09/15 0748 06/09/15 1029      Assessment/Plan: Problem List: Patient Active Problem List   Diagnosis Date Noted  . S/P laparoscopic sleeve gastrectomy Dec 2016 06/09/2015    Stable postop.  Begin PD 1 bariatric diet.  1 Day Post-Op    LOS: 1 day  Matt B. Hassell Done, MD, [facility] [phone] beeper [phone]  06/10/2015 2:56 PM

## 2015-06-11 LAB — CBC WITH DIFFERENTIAL/PLATELET
Basophils Absolute: 0 10*3/uL (ref 0.0–0.1)
Basophils Relative: 0 %
EOS PCT: 1 %
Eosinophils Absolute: 0.1 10*3/uL (ref 0.0–0.7)
HCT: 45.1 % (ref 36.0–46.0)
Hemoglobin: 14.8 g/dL (ref 12.0–15.0)
LYMPHS ABS: 2.1 10*3/uL (ref 0.7–4.0)
LYMPHS PCT: 17 %
MCH: 30.1 pg (ref 26.0–34.0)
MCHC: 32.8 g/dL (ref 30.0–36.0)
MCV: 91.7 fL (ref 78.0–100.0)
MONO ABS: 1.4 10*3/uL — AB (ref 0.1–1.0)
MONOS PCT: 11 %
NEUTROS ABS: 8.8 10*3/uL — AB (ref 1.7–7.7)
NEUTROS PCT: 71 %
PLATELETS: 312 10*3/uL (ref 150–400)
RBC: 4.92 MIL/uL (ref 3.87–5.11)
RDW: 13 % (ref 11.5–15.5)
WBC: 12.4 10*3/uL — ABNORMAL HIGH (ref 4.0–10.5)

## 2015-06-11 MED ORDER — PROMETHAZINE HCL 25 MG RE SUPP: 25.0000 mg | Freq: Four times a day (QID) | RECTAL | Status: DC | PRN

## 2015-06-11 MED ORDER — PROMETHAZINE HCL 12.5 MG PO TABS: 12.5000 mg | ORAL_TABLET | Freq: Four times a day (QID) | ORAL | Status: DC | PRN

## 2015-06-11 MED ORDER — OXYCODONE HCL 5 MG/5ML PO SOLN: 5.0000 mg | ORAL | Status: DC | PRN

## 2015-06-11 NOTE — Progress Notes (Signed)
Patient alert and oriented, pain is controlled. Patient is tolerating fluids,  advanced to protein shake today, patient tolerating well.  Reviewed Gastric sleeve discharge instructions with patient and patient is able to articulate understanding.  Provided information on BELT program, Support Group and [facility] outpatient pharmacy. All questions answered, will continue to monitor.  

## 2015-06-11 NOTE — Progress Notes (Signed)
Patient alert and oriented, pain controlled. Patient received discharge instructions and notified of prescriptions sent to home pharmacy. Patient verbalized understanding of instructions and home care. All questions and concerns answered.

## 2015-06-11 NOTE — Discharge Summary (Signed)
Physician Discharge Summary  Patient ID: Tammy Wilson MRN: [id number] DOB/AGE: August 21, 1965 49 y.o.  Admit date: 06/09/2015 Discharge date: 06/11/2015  Admission Diagnoses:  Morbid obesity  Discharge Diagnoses:  same  Principal Problem:   S/P laparoscopic sleeve gastrectomy Dec 2016   Surgery:  Laparoscopic sleeve gastrectomy  Discharged Condition: improved  Hospital Course:   Had surgery;  Nauseated postop.  Nausea better.  Ready for discharge on PD 2.    Consults: none  Significant Diagnostic Studies: none    Discharge Exam: Blood pressure 136/86, pulse 77, temperature 98.5 F (36.9 C), temperature source Oral, resp. rate 18, height 5\' 6"  (1.676 m), weight 122.528 kg (270 lb 2 oz), SpO2 99 %. Incisions OK  Disposition: 01-Home or Self Care  Discharge Instructions    Ambulate hourly while awake    Complete by:  As directed      Call MD for:  difficulty breathing, headache or visual disturbances    Complete by:  As directed      Call MD for:  persistant dizziness or light-headedness    Complete by:  As directed      Call MD for:  persistant nausea and vomiting    Complete by:  As directed      Call MD for:  redness, tenderness, or signs of infection (pain, swelling, redness, odor or green/yellow discharge around incision site)    Complete by:  As directed      Call MD for:  severe uncontrolled pain    Complete by:  As directed      Call MD for:  temperature >101 F    Complete by:  As directed      Diet bariatric full liquid    Complete by:  As directed      Incentive spirometry    Complete by:  As directed   Perform hourly while awake            Medication List    TAKE these medications        LORazepam 0.5 MG tablet  Commonly known as:  ATIVAN  Take 0.5 mg by mouth every 8 (eight) hours as needed for anxiety.     omeprazole 20 MG capsule  Commonly known as:  PRILOSEC  Take 20 mg by mouth daily.     oxyCODONE 5 MG/5ML solution  Commonly known  as:  ROXICODONE  Take 5-10 mLs (5-10 mg total) by mouth every 4 (four) hours as needed for moderate pain or severe pain.     promethazine 25 MG suppository  Commonly known as:  PHENERGAN  Place 1 suppository (25 mg total) rectally every 6 (six) hours as needed for nausea or vomiting.     promethazine 12.5 MG tablet  Commonly known as:  PHENERGAN  Take 1 tablet (12.5 mg total) by mouth every 6 (six) hours as needed for nausea or vomiting.     venlafaxine XR 75 MG 24 hr capsule  Commonly known as:  EFFEXOR-XR  Take 75 mg by mouth daily with breakfast.  Notes to Patient:  This medication cannot be cut in half, crushed or chewed.  If it is too large to swallow discuss with prescribing doctor changing to an immediate release formula           Follow-up Information    Follow up with Pedro Earls, MD. Go on 06/25/2015.   Specialty:  General Surgery   Why:  For Post-Op Check at 9:15   Contact information:   [id number]  [address] [place] [address] [phone]       Signed: Pedro Earls 06/11/2015, 8:53 AM

## 2015-06-11 NOTE — Discharge Instructions (Signed)

## 2015-06-16 ENCOUNTER — Telehealth (HOSPITAL_COMMUNITY): Payer: Self-pay

## 2015-06-16 NOTE — Telephone Encounter (Signed)

## 2015-06-24 ENCOUNTER — Ambulatory Visit: Payer: Worker's Compensation

## 2015-07-01 ENCOUNTER — Ambulatory Visit: Payer: Worker's Compensation

## 2015-10-06 DIAGNOSIS — N390 Urinary tract infection, site not specified: Secondary | ICD-10-CM | POA: Diagnosis not present

## 2015-10-07 DIAGNOSIS — N3289 Other specified disorders of bladder: Secondary | ICD-10-CM | POA: Diagnosis not present

## 2015-10-07 DIAGNOSIS — R310 Gross hematuria: Secondary | ICD-10-CM | POA: Diagnosis not present

## 2015-10-07 DIAGNOSIS — N200 Calculus of kidney: Secondary | ICD-10-CM | POA: Diagnosis not present

## 2015-10-07 DIAGNOSIS — N132 Hydronephrosis with renal and ureteral calculous obstruction: Secondary | ICD-10-CM | POA: Diagnosis not present

## 2015-10-07 DIAGNOSIS — N3946 Mixed incontinence: Secondary | ICD-10-CM | POA: Diagnosis not present

## 2015-10-07 DIAGNOSIS — N329 Bladder disorder, unspecified: Secondary | ICD-10-CM | POA: Diagnosis not present

## 2015-10-08 DIAGNOSIS — G8918 Other acute postprocedural pain: Secondary | ICD-10-CM | POA: Diagnosis not present

## 2015-10-08 DIAGNOSIS — R310 Gross hematuria: Secondary | ICD-10-CM | POA: Diagnosis not present

## 2015-10-08 DIAGNOSIS — N201 Calculus of ureter: Secondary | ICD-10-CM | POA: Diagnosis not present

## 2015-10-08 DIAGNOSIS — R109 Unspecified abdominal pain: Secondary | ICD-10-CM | POA: Diagnosis not present

## 2015-10-08 DIAGNOSIS — N202 Calculus of kidney with calculus of ureter: Secondary | ICD-10-CM | POA: Diagnosis not present

## 2015-10-08 DIAGNOSIS — N200 Calculus of kidney: Secondary | ICD-10-CM | POA: Diagnosis not present

## 2015-10-08 DIAGNOSIS — N329 Bladder disorder, unspecified: Secondary | ICD-10-CM | POA: Diagnosis not present

## 2015-10-09 DIAGNOSIS — Z6834 Body mass index (BMI) 34.0-34.9, adult: Secondary | ICD-10-CM | POA: Diagnosis not present

## 2015-10-09 DIAGNOSIS — Z885 Allergy status to narcotic agent status: Secondary | ICD-10-CM | POA: Diagnosis not present

## 2015-10-09 DIAGNOSIS — R300 Dysuria: Secondary | ICD-10-CM | POA: Diagnosis not present

## 2015-10-09 DIAGNOSIS — Z79899 Other long term (current) drug therapy: Secondary | ICD-10-CM | POA: Diagnosis not present

## 2015-10-09 DIAGNOSIS — Z6835 Body mass index (BMI) 35.0-35.9, adult: Secondary | ICD-10-CM | POA: Diagnosis not present

## 2015-10-09 DIAGNOSIS — R1031 Right lower quadrant pain: Secondary | ICD-10-CM | POA: Diagnosis not present

## 2015-10-09 DIAGNOSIS — N201 Calculus of ureter: Secondary | ICD-10-CM | POA: Diagnosis not present

## 2015-10-09 DIAGNOSIS — G8918 Other acute postprocedural pain: Secondary | ICD-10-CM | POA: Diagnosis not present

## 2015-10-09 DIAGNOSIS — N202 Calculus of kidney with calculus of ureter: Secondary | ICD-10-CM | POA: Diagnosis not present

## 2015-10-09 DIAGNOSIS — R109 Unspecified abdominal pain: Secondary | ICD-10-CM | POA: Diagnosis not present

## 2015-10-09 DIAGNOSIS — F419 Anxiety disorder, unspecified: Secondary | ICD-10-CM | POA: Diagnosis not present

## 2015-10-10 DIAGNOSIS — Z6835 Body mass index (BMI) 35.0-35.9, adult: Secondary | ICD-10-CM | POA: Diagnosis not present

## 2015-10-10 DIAGNOSIS — N201 Calculus of ureter: Secondary | ICD-10-CM | POA: Diagnosis not present

## 2015-10-13 DIAGNOSIS — N200 Calculus of kidney: Secondary | ICD-10-CM | POA: Diagnosis not present

## 2015-10-17 DIAGNOSIS — N200 Calculus of kidney: Secondary | ICD-10-CM | POA: Diagnosis not present

## 2015-10-17 DIAGNOSIS — N3946 Mixed incontinence: Secondary | ICD-10-CM | POA: Diagnosis not present

## 2015-10-17 DIAGNOSIS — N3281 Overactive bladder: Secondary | ICD-10-CM | POA: Diagnosis not present

## 2015-11-27 DIAGNOSIS — K219 Gastro-esophageal reflux disease without esophagitis: Secondary | ICD-10-CM | POA: Diagnosis not present

## 2015-11-27 DIAGNOSIS — F419 Anxiety disorder, unspecified: Secondary | ICD-10-CM | POA: Diagnosis not present

## 2016-02-26 DIAGNOSIS — C44729 Squamous cell carcinoma of skin of left lower limb, including hip: Secondary | ICD-10-CM | POA: Diagnosis not present

## 2016-03-25 DIAGNOSIS — R300 Dysuria: Secondary | ICD-10-CM | POA: Diagnosis not present

## 2016-03-25 DIAGNOSIS — F419 Anxiety disorder, unspecified: Secondary | ICD-10-CM | POA: Diagnosis not present

## 2016-03-25 DIAGNOSIS — N951 Menopausal and female climacteric states: Secondary | ICD-10-CM | POA: Diagnosis not present

## 2016-04-26 DIAGNOSIS — Z0000 Encounter for general adult medical examination without abnormal findings: Secondary | ICD-10-CM | POA: Diagnosis not present

## 2016-04-26 DIAGNOSIS — R911 Solitary pulmonary nodule: Secondary | ICD-10-CM | POA: Diagnosis not present

## 2016-04-26 DIAGNOSIS — Z1231 Encounter for screening mammogram for malignant neoplasm of breast: Secondary | ICD-10-CM | POA: Diagnosis not present

## 2016-05-27 DIAGNOSIS — F419 Anxiety disorder, unspecified: Secondary | ICD-10-CM | POA: Diagnosis not present

## 2016-05-27 DIAGNOSIS — R1030 Lower abdominal pain, unspecified: Secondary | ICD-10-CM | POA: Diagnosis not present

## 2016-05-27 DIAGNOSIS — N1330 Unspecified hydronephrosis: Secondary | ICD-10-CM | POA: Diagnosis not present

## 2016-05-27 DIAGNOSIS — R319 Hematuria, unspecified: Secondary | ICD-10-CM | POA: Diagnosis not present

## 2016-05-27 DIAGNOSIS — R110 Nausea: Secondary | ICD-10-CM | POA: Diagnosis not present

## 2016-06-03 DIAGNOSIS — Z6832 Body mass index (BMI) 32.0-32.9, adult: Secondary | ICD-10-CM | POA: Diagnosis not present

## 2016-06-03 DIAGNOSIS — N3946 Mixed incontinence: Secondary | ICD-10-CM | POA: Diagnosis not present

## 2016-06-03 DIAGNOSIS — N1330 Unspecified hydronephrosis: Secondary | ICD-10-CM | POA: Diagnosis not present

## 2016-06-10 DIAGNOSIS — N1330 Unspecified hydronephrosis: Secondary | ICD-10-CM | POA: Diagnosis not present

## 2016-06-18 DIAGNOSIS — R918 Other nonspecific abnormal finding of lung field: Secondary | ICD-10-CM | POA: Diagnosis not present

## 2016-06-18 DIAGNOSIS — R911 Solitary pulmonary nodule: Secondary | ICD-10-CM | POA: Diagnosis not present

## 2016-06-22 DIAGNOSIS — N1330 Unspecified hydronephrosis: Secondary | ICD-10-CM | POA: Diagnosis not present

## 2016-06-22 DIAGNOSIS — R339 Retention of urine, unspecified: Secondary | ICD-10-CM | POA: Diagnosis not present

## 2016-06-22 DIAGNOSIS — R3129 Other microscopic hematuria: Secondary | ICD-10-CM | POA: Insufficient documentation

## 2016-06-22 DIAGNOSIS — N3946 Mixed incontinence: Secondary | ICD-10-CM | POA: Diagnosis not present

## 2016-06-22 DIAGNOSIS — N3281 Overactive bladder: Secondary | ICD-10-CM | POA: Diagnosis not present

## 2016-07-07 DIAGNOSIS — R911 Solitary pulmonary nodule: Secondary | ICD-10-CM | POA: Diagnosis not present

## 2016-07-07 DIAGNOSIS — R918 Other nonspecific abnormal finding of lung field: Secondary | ICD-10-CM | POA: Diagnosis not present

## 2016-07-07 DIAGNOSIS — R590 Localized enlarged lymph nodes: Secondary | ICD-10-CM | POA: Diagnosis not present

## 2016-07-08 DIAGNOSIS — R590 Localized enlarged lymph nodes: Secondary | ICD-10-CM | POA: Diagnosis not present

## 2016-07-08 DIAGNOSIS — R937 Abnormal findings on diagnostic imaging of other parts of musculoskeletal system: Secondary | ICD-10-CM | POA: Diagnosis not present

## 2016-07-08 DIAGNOSIS — E041 Nontoxic single thyroid nodule: Secondary | ICD-10-CM | POA: Diagnosis not present

## 2016-07-13 DIAGNOSIS — E041 Nontoxic single thyroid nodule: Secondary | ICD-10-CM | POA: Diagnosis not present

## 2016-07-19 DIAGNOSIS — N200 Calculus of kidney: Secondary | ICD-10-CM

## 2016-07-19 DIAGNOSIS — R3129 Other microscopic hematuria: Secondary | ICD-10-CM | POA: Diagnosis not present

## 2016-07-19 DIAGNOSIS — N1330 Unspecified hydronephrosis: Secondary | ICD-10-CM | POA: Diagnosis not present

## 2016-07-19 DIAGNOSIS — N3281 Overactive bladder: Secondary | ICD-10-CM | POA: Diagnosis not present

## 2016-07-19 DIAGNOSIS — R3915 Urgency of urination: Secondary | ICD-10-CM | POA: Diagnosis not present

## 2016-07-19 HISTORY — DX: Calculus of kidney: N20.0

## 2016-07-22 DIAGNOSIS — M899 Disorder of bone, unspecified: Secondary | ICD-10-CM | POA: Diagnosis not present

## 2016-07-22 DIAGNOSIS — R937 Abnormal findings on diagnostic imaging of other parts of musculoskeletal system: Secondary | ICD-10-CM | POA: Diagnosis not present

## 2016-07-27 DIAGNOSIS — E041 Nontoxic single thyroid nodule: Secondary | ICD-10-CM | POA: Diagnosis not present

## 2016-07-29 DIAGNOSIS — R319 Hematuria, unspecified: Secondary | ICD-10-CM | POA: Insufficient documentation

## 2016-07-30 DIAGNOSIS — C73 Malignant neoplasm of thyroid gland: Secondary | ICD-10-CM | POA: Diagnosis not present

## 2016-08-03 DIAGNOSIS — F419 Anxiety disorder, unspecified: Secondary | ICD-10-CM | POA: Diagnosis not present

## 2016-08-03 DIAGNOSIS — C73 Malignant neoplasm of thyroid gland: Secondary | ICD-10-CM | POA: Diagnosis not present

## 2016-08-03 DIAGNOSIS — R300 Dysuria: Secondary | ICD-10-CM | POA: Diagnosis not present

## 2016-08-03 DIAGNOSIS — N1330 Unspecified hydronephrosis: Secondary | ICD-10-CM | POA: Diagnosis not present

## 2016-08-03 DIAGNOSIS — K219 Gastro-esophageal reflux disease without esophagitis: Secondary | ICD-10-CM | POA: Diagnosis not present

## 2016-08-03 DIAGNOSIS — N3281 Overactive bladder: Secondary | ICD-10-CM | POA: Diagnosis not present

## 2016-08-03 DIAGNOSIS — Z85828 Personal history of other malignant neoplasm of skin: Secondary | ICD-10-CM | POA: Diagnosis not present

## 2016-08-03 DIAGNOSIS — R3129 Other microscopic hematuria: Secondary | ICD-10-CM | POA: Diagnosis not present

## 2016-08-03 DIAGNOSIS — Z9884 Bariatric surgery status: Secondary | ICD-10-CM | POA: Diagnosis not present

## 2016-08-03 DIAGNOSIS — R310 Gross hematuria: Secondary | ICD-10-CM | POA: Diagnosis not present

## 2016-08-03 DIAGNOSIS — E663 Overweight: Secondary | ICD-10-CM | POA: Diagnosis not present

## 2016-08-03 DIAGNOSIS — R3121 Asymptomatic microscopic hematuria: Secondary | ICD-10-CM | POA: Diagnosis not present

## 2016-08-03 DIAGNOSIS — Z87442 Personal history of urinary calculi: Secondary | ICD-10-CM | POA: Diagnosis not present

## 2016-08-03 DIAGNOSIS — R3915 Urgency of urination: Secondary | ICD-10-CM | POA: Diagnosis not present

## 2016-08-04 DIAGNOSIS — E041 Nontoxic single thyroid nodule: Secondary | ICD-10-CM | POA: Insufficient documentation

## 2016-08-06 DIAGNOSIS — D34 Benign neoplasm of thyroid gland: Secondary | ICD-10-CM | POA: Diagnosis not present

## 2016-08-06 DIAGNOSIS — Z85820 Personal history of malignant melanoma of skin: Secondary | ICD-10-CM | POA: Diagnosis not present

## 2016-08-06 DIAGNOSIS — Z85850 Personal history of malignant neoplasm of thyroid: Secondary | ICD-10-CM | POA: Diagnosis not present

## 2016-08-06 DIAGNOSIS — C73 Malignant neoplasm of thyroid gland: Secondary | ICD-10-CM | POA: Diagnosis not present

## 2016-08-06 DIAGNOSIS — E041 Nontoxic single thyroid nodule: Secondary | ICD-10-CM | POA: Diagnosis not present

## 2016-08-06 DIAGNOSIS — Z87442 Personal history of urinary calculi: Secondary | ICD-10-CM | POA: Diagnosis not present

## 2016-08-06 DIAGNOSIS — R918 Other nonspecific abnormal finding of lung field: Secondary | ICD-10-CM | POA: Diagnosis not present

## 2016-08-06 DIAGNOSIS — M898X5 Other specified disorders of bone, thigh: Secondary | ICD-10-CM | POA: Diagnosis not present

## 2016-08-06 DIAGNOSIS — Z85828 Personal history of other malignant neoplasm of skin: Secondary | ICD-10-CM | POA: Diagnosis not present

## 2016-08-08 DIAGNOSIS — M898X5 Other specified disorders of bone, thigh: Secondary | ICD-10-CM | POA: Insufficient documentation

## 2016-08-08 DIAGNOSIS — R918 Other nonspecific abnormal finding of lung field: Secondary | ICD-10-CM | POA: Insufficient documentation

## 2016-08-17 DIAGNOSIS — D34 Benign neoplasm of thyroid gland: Secondary | ICD-10-CM | POA: Diagnosis not present

## 2016-08-17 DIAGNOSIS — K219 Gastro-esophageal reflux disease without esophagitis: Secondary | ICD-10-CM | POA: Diagnosis not present

## 2016-08-17 DIAGNOSIS — Z9884 Bariatric surgery status: Secondary | ICD-10-CM | POA: Diagnosis not present

## 2016-08-17 DIAGNOSIS — E065 Other chronic thyroiditis: Secondary | ICD-10-CM | POA: Diagnosis not present

## 2016-08-17 DIAGNOSIS — F419 Anxiety disorder, unspecified: Secondary | ICD-10-CM | POA: Diagnosis not present

## 2016-08-17 DIAGNOSIS — E041 Nontoxic single thyroid nodule: Secondary | ICD-10-CM | POA: Diagnosis not present

## 2016-08-17 DIAGNOSIS — C73 Malignant neoplasm of thyroid gland: Secondary | ICD-10-CM | POA: Diagnosis not present

## 2016-08-17 DIAGNOSIS — Z888 Allergy status to other drugs, medicaments and biological substances status: Secondary | ICD-10-CM | POA: Diagnosis not present

## 2016-08-17 DIAGNOSIS — Z79899 Other long term (current) drug therapy: Secondary | ICD-10-CM | POA: Diagnosis not present

## 2016-08-17 DIAGNOSIS — E890 Postprocedural hypothyroidism: Secondary | ICD-10-CM | POA: Diagnosis not present

## 2016-08-18 DIAGNOSIS — F419 Anxiety disorder, unspecified: Secondary | ICD-10-CM | POA: Diagnosis not present

## 2016-08-18 DIAGNOSIS — K219 Gastro-esophageal reflux disease without esophagitis: Secondary | ICD-10-CM | POA: Insufficient documentation

## 2016-08-18 DIAGNOSIS — E890 Postprocedural hypothyroidism: Secondary | ICD-10-CM | POA: Insufficient documentation

## 2016-08-18 DIAGNOSIS — Z9884 Bariatric surgery status: Secondary | ICD-10-CM | POA: Diagnosis not present

## 2016-08-18 DIAGNOSIS — Z79899 Other long term (current) drug therapy: Secondary | ICD-10-CM | POA: Diagnosis not present

## 2016-08-18 DIAGNOSIS — D34 Benign neoplasm of thyroid gland: Secondary | ICD-10-CM | POA: Diagnosis not present

## 2016-08-18 DIAGNOSIS — Z888 Allergy status to other drugs, medicaments and biological substances status: Secondary | ICD-10-CM | POA: Diagnosis not present

## 2016-08-18 HISTORY — DX: Postprocedural hypothyroidism: E89.0

## 2016-08-25 DIAGNOSIS — Z79899 Other long term (current) drug therapy: Secondary | ICD-10-CM | POA: Diagnosis not present

## 2016-08-25 DIAGNOSIS — D34 Benign neoplasm of thyroid gland: Secondary | ICD-10-CM | POA: Diagnosis not present

## 2016-08-25 DIAGNOSIS — Z483 Aftercare following surgery for neoplasm: Secondary | ICD-10-CM | POA: Diagnosis not present

## 2016-08-25 DIAGNOSIS — E890 Postprocedural hypothyroidism: Secondary | ICD-10-CM | POA: Diagnosis not present

## 2016-08-27 DIAGNOSIS — R590 Localized enlarged lymph nodes: Secondary | ICD-10-CM | POA: Diagnosis not present

## 2016-09-09 DIAGNOSIS — Z79899 Other long term (current) drug therapy: Secondary | ICD-10-CM | POA: Diagnosis not present

## 2016-09-09 DIAGNOSIS — Z9884 Bariatric surgery status: Secondary | ICD-10-CM | POA: Diagnosis not present

## 2016-09-09 DIAGNOSIS — M899 Disorder of bone, unspecified: Secondary | ICD-10-CM | POA: Diagnosis not present

## 2016-09-09 DIAGNOSIS — R590 Localized enlarged lymph nodes: Secondary | ICD-10-CM | POA: Diagnosis not present

## 2016-09-09 DIAGNOSIS — R911 Solitary pulmonary nodule: Secondary | ICD-10-CM | POA: Diagnosis not present

## 2016-09-09 DIAGNOSIS — Z85828 Personal history of other malignant neoplasm of skin: Secondary | ICD-10-CM | POA: Diagnosis not present

## 2016-09-09 DIAGNOSIS — K219 Gastro-esophageal reflux disease without esophagitis: Secondary | ICD-10-CM | POA: Diagnosis not present

## 2016-09-09 DIAGNOSIS — F419 Anxiety disorder, unspecified: Secondary | ICD-10-CM | POA: Diagnosis not present

## 2016-09-09 DIAGNOSIS — E890 Postprocedural hypothyroidism: Secondary | ICD-10-CM | POA: Diagnosis not present

## 2016-09-09 DIAGNOSIS — Z886 Allergy status to analgesic agent status: Secondary | ICD-10-CM | POA: Diagnosis not present

## 2016-09-17 DIAGNOSIS — N1339 Other hydronephrosis: Secondary | ICD-10-CM | POA: Diagnosis not present

## 2016-09-17 DIAGNOSIS — R319 Hematuria, unspecified: Secondary | ICD-10-CM | POA: Diagnosis not present

## 2016-10-04 DIAGNOSIS — R590 Localized enlarged lymph nodes: Secondary | ICD-10-CM | POA: Diagnosis not present

## 2016-10-05 DIAGNOSIS — R319 Hematuria, unspecified: Secondary | ICD-10-CM | POA: Diagnosis not present

## 2016-10-05 DIAGNOSIS — N1339 Other hydronephrosis: Secondary | ICD-10-CM | POA: Diagnosis not present

## 2016-10-15 DIAGNOSIS — M25552 Pain in left hip: Secondary | ICD-10-CM | POA: Diagnosis not present

## 2016-10-15 DIAGNOSIS — Z9089 Acquired absence of other organs: Secondary | ICD-10-CM | POA: Diagnosis not present

## 2016-10-15 DIAGNOSIS — D869 Sarcoidosis, unspecified: Secondary | ICD-10-CM | POA: Diagnosis not present

## 2016-10-15 DIAGNOSIS — R918 Other nonspecific abnormal finding of lung field: Secondary | ICD-10-CM | POA: Diagnosis not present

## 2016-10-15 DIAGNOSIS — Z6833 Body mass index (BMI) 33.0-33.9, adult: Secondary | ICD-10-CM | POA: Diagnosis not present

## 2016-10-15 DIAGNOSIS — Z79899 Other long term (current) drug therapy: Secondary | ICD-10-CM | POA: Diagnosis not present

## 2016-10-15 DIAGNOSIS — E669 Obesity, unspecified: Secondary | ICD-10-CM | POA: Diagnosis not present

## 2016-10-15 DIAGNOSIS — D34 Benign neoplasm of thyroid gland: Secondary | ICD-10-CM | POA: Diagnosis not present

## 2016-10-15 DIAGNOSIS — E041 Nontoxic single thyroid nodule: Secondary | ICD-10-CM | POA: Diagnosis not present

## 2016-10-15 DIAGNOSIS — E890 Postprocedural hypothyroidism: Secondary | ICD-10-CM | POA: Diagnosis not present

## 2016-10-15 DIAGNOSIS — M899 Disorder of bone, unspecified: Secondary | ICD-10-CM | POA: Diagnosis not present

## 2016-10-15 DIAGNOSIS — M898X5 Other specified disorders of bone, thigh: Secondary | ICD-10-CM | POA: Diagnosis not present

## 2016-10-15 DIAGNOSIS — Z885 Allergy status to narcotic agent status: Secondary | ICD-10-CM | POA: Diagnosis not present

## 2016-10-19 DIAGNOSIS — N1339 Other hydronephrosis: Secondary | ICD-10-CM | POA: Diagnosis not present

## 2016-10-19 DIAGNOSIS — R828 Abnormal findings on cytological and histological examination of urine: Secondary | ICD-10-CM | POA: Diagnosis not present

## 2016-10-19 DIAGNOSIS — R590 Localized enlarged lymph nodes: Secondary | ICD-10-CM | POA: Diagnosis not present

## 2016-10-19 DIAGNOSIS — Z466 Encounter for fitting and adjustment of urinary device: Secondary | ICD-10-CM | POA: Diagnosis not present

## 2016-10-19 DIAGNOSIS — Z886 Allergy status to analgesic agent status: Secondary | ICD-10-CM | POA: Diagnosis not present

## 2016-10-19 DIAGNOSIS — Z79899 Other long term (current) drug therapy: Secondary | ICD-10-CM | POA: Diagnosis not present

## 2016-10-19 DIAGNOSIS — Z9884 Bariatric surgery status: Secondary | ICD-10-CM | POA: Diagnosis not present

## 2016-10-19 DIAGNOSIS — K219 Gastro-esophageal reflux disease without esophagitis: Secondary | ICD-10-CM | POA: Diagnosis not present

## 2016-10-19 DIAGNOSIS — N1330 Unspecified hydronephrosis: Secondary | ICD-10-CM | POA: Diagnosis not present

## 2016-10-19 DIAGNOSIS — E890 Postprocedural hypothyroidism: Secondary | ICD-10-CM | POA: Diagnosis not present

## 2016-10-27 DIAGNOSIS — M25552 Pain in left hip: Secondary | ICD-10-CM | POA: Diagnosis not present

## 2016-10-27 DIAGNOSIS — M899 Disorder of bone, unspecified: Secondary | ICD-10-CM | POA: Diagnosis not present

## 2016-12-07 DIAGNOSIS — M7711 Lateral epicondylitis, right elbow: Secondary | ICD-10-CM | POA: Diagnosis not present

## 2016-12-07 DIAGNOSIS — M7712 Lateral epicondylitis, left elbow: Secondary | ICD-10-CM | POA: Diagnosis not present

## 2016-12-08 DIAGNOSIS — D860 Sarcoidosis of lung: Secondary | ICD-10-CM | POA: Diagnosis not present

## 2016-12-08 DIAGNOSIS — R590 Localized enlarged lymph nodes: Secondary | ICD-10-CM | POA: Diagnosis not present

## 2016-12-08 DIAGNOSIS — C73 Malignant neoplasm of thyroid gland: Secondary | ICD-10-CM | POA: Diagnosis not present

## 2016-12-08 DIAGNOSIS — R918 Other nonspecific abnormal finding of lung field: Secondary | ICD-10-CM | POA: Diagnosis not present

## 2016-12-08 DIAGNOSIS — D869 Sarcoidosis, unspecified: Secondary | ICD-10-CM | POA: Diagnosis not present

## 2017-01-13 ENCOUNTER — Encounter (HOSPITAL_COMMUNITY): Payer: Self-pay

## 2017-02-16 DIAGNOSIS — H04123 Dry eye syndrome of bilateral lacrimal glands: Secondary | ICD-10-CM | POA: Diagnosis not present

## 2017-02-16 DIAGNOSIS — H5203 Hypermetropia, bilateral: Secondary | ICD-10-CM | POA: Diagnosis not present

## 2017-04-26 DIAGNOSIS — E039 Hypothyroidism, unspecified: Secondary | ICD-10-CM | POA: Diagnosis not present

## 2017-04-26 DIAGNOSIS — R5383 Other fatigue: Secondary | ICD-10-CM | POA: Diagnosis not present

## 2017-04-26 DIAGNOSIS — R635 Abnormal weight gain: Secondary | ICD-10-CM | POA: Diagnosis not present

## 2017-05-30 DIAGNOSIS — Z1231 Encounter for screening mammogram for malignant neoplasm of breast: Secondary | ICD-10-CM | POA: Diagnosis not present

## 2017-06-08 DIAGNOSIS — R928 Other abnormal and inconclusive findings on diagnostic imaging of breast: Secondary | ICD-10-CM | POA: Diagnosis not present

## 2017-06-09 DIAGNOSIS — E039 Hypothyroidism, unspecified: Secondary | ICD-10-CM | POA: Diagnosis not present

## 2017-06-28 HISTORY — PX: THYROIDECTOMY: SHX17

## 2017-09-20 DIAGNOSIS — F419 Anxiety disorder, unspecified: Secondary | ICD-10-CM | POA: Diagnosis not present

## 2017-09-20 DIAGNOSIS — R454 Irritability and anger: Secondary | ICD-10-CM | POA: Diagnosis not present

## 2017-09-20 DIAGNOSIS — R21 Rash and other nonspecific skin eruption: Secondary | ICD-10-CM | POA: Diagnosis not present

## 2017-09-20 DIAGNOSIS — E039 Hypothyroidism, unspecified: Secondary | ICD-10-CM | POA: Diagnosis not present

## 2018-01-26 DIAGNOSIS — N644 Mastodynia: Secondary | ICD-10-CM | POA: Diagnosis not present

## 2018-01-26 DIAGNOSIS — Z79899 Other long term (current) drug therapy: Secondary | ICD-10-CM | POA: Diagnosis not present

## 2018-01-26 DIAGNOSIS — F419 Anxiety disorder, unspecified: Secondary | ICD-10-CM | POA: Diagnosis not present

## 2018-01-26 DIAGNOSIS — R079 Chest pain, unspecified: Secondary | ICD-10-CM | POA: Diagnosis not present

## 2018-01-26 DIAGNOSIS — E039 Hypothyroidism, unspecified: Secondary | ICD-10-CM | POA: Diagnosis not present

## 2018-03-02 DIAGNOSIS — R1012 Left upper quadrant pain: Secondary | ICD-10-CM | POA: Diagnosis not present

## 2018-03-02 DIAGNOSIS — Z6835 Body mass index (BMI) 35.0-35.9, adult: Secondary | ICD-10-CM | POA: Diagnosis not present

## 2018-03-02 DIAGNOSIS — R918 Other nonspecific abnormal finding of lung field: Secondary | ICD-10-CM | POA: Diagnosis not present

## 2018-03-02 DIAGNOSIS — E669 Obesity, unspecified: Secondary | ICD-10-CM | POA: Diagnosis not present

## 2018-03-06 DIAGNOSIS — R300 Dysuria: Secondary | ICD-10-CM | POA: Diagnosis not present

## 2018-03-07 DIAGNOSIS — R590 Localized enlarged lymph nodes: Secondary | ICD-10-CM | POA: Diagnosis not present

## 2018-03-07 DIAGNOSIS — R918 Other nonspecific abnormal finding of lung field: Secondary | ICD-10-CM | POA: Diagnosis not present

## 2018-03-07 DIAGNOSIS — N200 Calculus of kidney: Secondary | ICD-10-CM | POA: Diagnosis not present

## 2018-03-09 DIAGNOSIS — N390 Urinary tract infection, site not specified: Secondary | ICD-10-CM | POA: Diagnosis not present

## 2018-03-09 DIAGNOSIS — Z960 Presence of urogenital implants: Secondary | ICD-10-CM | POA: Diagnosis not present

## 2018-03-09 DIAGNOSIS — N202 Calculus of kidney with calculus of ureter: Secondary | ICD-10-CM | POA: Diagnosis not present

## 2018-03-09 DIAGNOSIS — N200 Calculus of kidney: Secondary | ICD-10-CM | POA: Diagnosis not present

## 2018-03-14 DIAGNOSIS — N200 Calculus of kidney: Secondary | ICD-10-CM | POA: Diagnosis not present

## 2018-03-14 DIAGNOSIS — R319 Hematuria, unspecified: Secondary | ICD-10-CM | POA: Diagnosis not present

## 2018-03-14 DIAGNOSIS — N202 Calculus of kidney with calculus of ureter: Secondary | ICD-10-CM | POA: Diagnosis not present

## 2018-03-21 DIAGNOSIS — N202 Calculus of kidney with calculus of ureter: Secondary | ICD-10-CM | POA: Diagnosis not present

## 2018-03-21 DIAGNOSIS — F419 Anxiety disorder, unspecified: Secondary | ICD-10-CM | POA: Diagnosis not present

## 2018-03-21 DIAGNOSIS — Z79899 Other long term (current) drug therapy: Secondary | ICD-10-CM | POA: Diagnosis not present

## 2018-03-21 DIAGNOSIS — N132 Hydronephrosis with renal and ureteral calculous obstruction: Secondary | ICD-10-CM | POA: Diagnosis not present

## 2018-03-21 DIAGNOSIS — N200 Calculus of kidney: Secondary | ICD-10-CM | POA: Diagnosis not present

## 2018-03-21 DIAGNOSIS — K219 Gastro-esophageal reflux disease without esophagitis: Secondary | ICD-10-CM | POA: Diagnosis not present

## 2018-03-27 DIAGNOSIS — N200 Calculus of kidney: Secondary | ICD-10-CM | POA: Diagnosis not present

## 2018-04-06 DIAGNOSIS — R8290 Unspecified abnormal findings in urine: Secondary | ICD-10-CM | POA: Diagnosis not present

## 2018-04-06 DIAGNOSIS — N200 Calculus of kidney: Secondary | ICD-10-CM | POA: Diagnosis not present

## 2018-04-10 DIAGNOSIS — N200 Calculus of kidney: Secondary | ICD-10-CM | POA: Diagnosis not present

## 2018-04-14 DIAGNOSIS — N200 Calculus of kidney: Secondary | ICD-10-CM | POA: Diagnosis not present

## 2018-06-27 DIAGNOSIS — N132 Hydronephrosis with renal and ureteral calculous obstruction: Secondary | ICD-10-CM | POA: Diagnosis not present

## 2018-06-27 DIAGNOSIS — N201 Calculus of ureter: Secondary | ICD-10-CM | POA: Diagnosis not present

## 2018-06-28 DIAGNOSIS — N201 Calculus of ureter: Secondary | ICD-10-CM | POA: Diagnosis not present

## 2018-06-28 DIAGNOSIS — R1032 Left lower quadrant pain: Secondary | ICD-10-CM | POA: Diagnosis not present

## 2018-06-28 DIAGNOSIS — N132 Hydronephrosis with renal and ureteral calculous obstruction: Secondary | ICD-10-CM | POA: Diagnosis not present

## 2018-06-30 DIAGNOSIS — N200 Calculus of kidney: Secondary | ICD-10-CM | POA: Diagnosis not present

## 2018-07-05 DIAGNOSIS — R339 Retention of urine, unspecified: Secondary | ICD-10-CM | POA: Diagnosis not present

## 2018-07-05 DIAGNOSIS — N200 Calculus of kidney: Secondary | ICD-10-CM | POA: Diagnosis not present

## 2018-07-27 DIAGNOSIS — N132 Hydronephrosis with renal and ureteral calculous obstruction: Secondary | ICD-10-CM | POA: Diagnosis not present

## 2018-07-27 DIAGNOSIS — K449 Diaphragmatic hernia without obstruction or gangrene: Secondary | ICD-10-CM | POA: Diagnosis not present

## 2018-07-27 DIAGNOSIS — R109 Unspecified abdominal pain: Secondary | ICD-10-CM | POA: Diagnosis not present

## 2018-07-27 DIAGNOSIS — Z01818 Encounter for other preprocedural examination: Secondary | ICD-10-CM | POA: Diagnosis not present

## 2018-07-27 DIAGNOSIS — N200 Calculus of kidney: Secondary | ICD-10-CM | POA: Diagnosis not present

## 2018-07-28 DIAGNOSIS — N200 Calculus of kidney: Secondary | ICD-10-CM | POA: Diagnosis not present

## 2018-07-28 DIAGNOSIS — Z01810 Encounter for preprocedural cardiovascular examination: Secondary | ICD-10-CM | POA: Diagnosis not present

## 2018-07-28 DIAGNOSIS — N202 Calculus of kidney with calculus of ureter: Secondary | ICD-10-CM | POA: Diagnosis not present

## 2018-08-01 DIAGNOSIS — R413 Other amnesia: Secondary | ICD-10-CM | POA: Diagnosis not present

## 2018-08-01 DIAGNOSIS — E039 Hypothyroidism, unspecified: Secondary | ICD-10-CM | POA: Diagnosis not present

## 2018-08-01 DIAGNOSIS — M2550 Pain in unspecified joint: Secondary | ICD-10-CM | POA: Diagnosis not present

## 2018-08-01 DIAGNOSIS — M25512 Pain in left shoulder: Secondary | ICD-10-CM | POA: Diagnosis not present

## 2018-08-08 DIAGNOSIS — N200 Calculus of kidney: Secondary | ICD-10-CM | POA: Diagnosis not present

## 2018-08-08 DIAGNOSIS — Z960 Presence of urogenital implants: Secondary | ICD-10-CM | POA: Diagnosis not present

## 2018-08-22 DIAGNOSIS — N200 Calculus of kidney: Secondary | ICD-10-CM | POA: Diagnosis not present

## 2018-08-23 DIAGNOSIS — N200 Calculus of kidney: Secondary | ICD-10-CM | POA: Diagnosis not present

## 2018-10-11 DIAGNOSIS — M25512 Pain in left shoulder: Secondary | ICD-10-CM | POA: Diagnosis not present

## 2019-02-13 DIAGNOSIS — R319 Hematuria, unspecified: Secondary | ICD-10-CM | POA: Diagnosis not present

## 2019-02-13 DIAGNOSIS — R8290 Unspecified abnormal findings in urine: Secondary | ICD-10-CM | POA: Diagnosis not present

## 2019-02-15 DIAGNOSIS — N200 Calculus of kidney: Secondary | ICD-10-CM | POA: Diagnosis not present

## 2019-02-15 DIAGNOSIS — R109 Unspecified abdominal pain: Secondary | ICD-10-CM | POA: Diagnosis not present

## 2019-02-19 DIAGNOSIS — N200 Calculus of kidney: Secondary | ICD-10-CM | POA: Diagnosis not present

## 2019-03-07 DIAGNOSIS — N200 Calculus of kidney: Secondary | ICD-10-CM | POA: Diagnosis not present

## 2019-03-27 ENCOUNTER — Other Ambulatory Visit: Payer: Self-pay | Admitting: Internal Medicine

## 2019-03-27 DIAGNOSIS — Z1231 Encounter for screening mammogram for malignant neoplasm of breast: Secondary | ICD-10-CM

## 2019-04-03 DIAGNOSIS — H18513 Endothelial corneal dystrophy, bilateral: Secondary | ICD-10-CM | POA: Diagnosis not present

## 2019-04-24 DIAGNOSIS — K219 Gastro-esophageal reflux disease without esophagitis: Secondary | ICD-10-CM | POA: Diagnosis not present

## 2019-04-24 DIAGNOSIS — F419 Anxiety disorder, unspecified: Secondary | ICD-10-CM | POA: Diagnosis not present

## 2019-04-24 DIAGNOSIS — R300 Dysuria: Secondary | ICD-10-CM | POA: Diagnosis not present

## 2019-04-24 DIAGNOSIS — D869 Sarcoidosis, unspecified: Secondary | ICD-10-CM | POA: Diagnosis not present

## 2019-04-24 DIAGNOSIS — N390 Urinary tract infection, site not specified: Secondary | ICD-10-CM | POA: Diagnosis not present

## 2019-04-24 DIAGNOSIS — Z79899 Other long term (current) drug therapy: Secondary | ICD-10-CM | POA: Diagnosis not present

## 2019-04-24 DIAGNOSIS — N200 Calculus of kidney: Secondary | ICD-10-CM | POA: Diagnosis not present

## 2019-04-24 DIAGNOSIS — R1012 Left upper quadrant pain: Secondary | ICD-10-CM | POA: Diagnosis not present

## 2019-04-24 DIAGNOSIS — E039 Hypothyroidism, unspecified: Secondary | ICD-10-CM | POA: Diagnosis not present

## 2019-05-02 ENCOUNTER — Encounter: Payer: Self-pay | Admitting: Gastroenterology

## 2019-05-09 DIAGNOSIS — D869 Sarcoidosis, unspecified: Secondary | ICD-10-CM | POA: Diagnosis not present

## 2019-05-09 DIAGNOSIS — E869 Volume depletion, unspecified: Secondary | ICD-10-CM | POA: Diagnosis not present

## 2019-05-09 DIAGNOSIS — R918 Other nonspecific abnormal finding of lung field: Secondary | ICD-10-CM | POA: Diagnosis not present

## 2019-05-11 ENCOUNTER — Other Ambulatory Visit: Payer: Self-pay

## 2019-05-11 ENCOUNTER — Ambulatory Visit
Admission: RE | Admit: 2019-05-11 | Discharge: 2019-05-11 | Disposition: A | Discharge status: Home / Self Care | Payer: Worker's Compensation | Source: Ambulatory Visit | Attending: Internal Medicine | Admitting: Internal Medicine

## 2019-05-11 ENCOUNTER — Ambulatory Visit: Payer: Worker's Compensation

## 2019-05-11 DIAGNOSIS — Z1231 Encounter for screening mammogram for malignant neoplasm of breast: Secondary | ICD-10-CM

## 2019-05-15 DIAGNOSIS — Z20828 Contact with and (suspected) exposure to other viral communicable diseases: Secondary | ICD-10-CM | POA: Diagnosis not present

## 2019-05-15 DIAGNOSIS — N200 Calculus of kidney: Secondary | ICD-10-CM | POA: Diagnosis not present

## 2019-05-15 DIAGNOSIS — R8290 Unspecified abnormal findings in urine: Secondary | ICD-10-CM | POA: Diagnosis not present

## 2019-05-15 DIAGNOSIS — Z01812 Encounter for preprocedural laboratory examination: Secondary | ICD-10-CM | POA: Diagnosis not present

## 2019-05-15 DIAGNOSIS — N132 Hydronephrosis with renal and ureteral calculous obstruction: Secondary | ICD-10-CM | POA: Diagnosis not present

## 2019-05-15 DIAGNOSIS — N201 Calculus of ureter: Secondary | ICD-10-CM | POA: Diagnosis not present

## 2019-05-15 DIAGNOSIS — R109 Unspecified abdominal pain: Secondary | ICD-10-CM | POA: Diagnosis not present

## 2019-05-16 DIAGNOSIS — N201 Calculus of ureter: Secondary | ICD-10-CM | POA: Diagnosis not present

## 2019-05-16 DIAGNOSIS — E669 Obesity, unspecified: Secondary | ICD-10-CM | POA: Diagnosis not present

## 2019-05-16 DIAGNOSIS — Z6836 Body mass index (BMI) 36.0-36.9, adult: Secondary | ICD-10-CM | POA: Diagnosis not present

## 2019-05-21 DIAGNOSIS — N201 Calculus of ureter: Secondary | ICD-10-CM | POA: Diagnosis not present

## 2019-06-06 ENCOUNTER — Encounter: Payer: Worker's Compensation | Admitting: Gastroenterology

## 2019-07-15 ENCOUNTER — Ambulatory Visit: Payer: BC Managed Care – PPO | Attending: Internal Medicine

## 2019-07-15 DIAGNOSIS — Z23 Encounter for immunization: Secondary | ICD-10-CM | POA: Insufficient documentation

## 2019-07-15 NOTE — Progress Notes (Signed)
   Covid-19 Vaccination Clinic  Name:  Tammy Wilson    MRN: [id number] DOB: Mar 25, 1966  07/15/2019  [name] was observed post Covid-19 immunization for 15 mins without incidence. She was provided with Vaccine Information Sheet and instruction to access the V-Safe system.   [name] was instructed to call 911 with any severe reactions post vaccine: Marland Kitchen Difficulty breathing  . Swelling of your face and throat  . A fast heartbeat  . A bad rash all over your body  . Dizziness and weakness

## 2019-07-31 ENCOUNTER — Encounter: Payer: Self-pay | Admitting: Internal Medicine

## 2019-08-05 ENCOUNTER — Ambulatory Visit: Payer: Self-pay | Attending: Internal Medicine

## 2019-08-05 DIAGNOSIS — Z23 Encounter for immunization: Secondary | ICD-10-CM | POA: Insufficient documentation

## 2019-08-05 NOTE — Progress Notes (Signed)
   Covid-19 Vaccination Clinic  Name:  Tammy Wilson    MRN: [id number] DOB: 17-Apr-1966  08/05/2019  [name] was observed post Covid-19 immunization for 15 minutes without incidence. She was provided with Vaccine Information Sheet and instruction to access the V-Safe system.   [name] was instructed to call 911 with any severe reactions post vaccine: Marland Kitchen Difficulty breathing  . Swelling of your face and throat  . A fast heartbeat  . A bad rash all over your body  . Dizziness and weakness    Immunizations Administered    Name Date Dose VIS Date Route   Pfizer COVID-19 Vaccine 08/05/2019 11:12 AM 0.3 mL 06/08/2019 Intramuscular   Manufacturer: [place]   Lot: [id number]   [place]: [id number]

## 2020-01-10 DIAGNOSIS — E890 Postprocedural hypothyroidism: Secondary | ICD-10-CM | POA: Diagnosis not present

## 2020-01-10 DIAGNOSIS — F419 Anxiety disorder, unspecified: Secondary | ICD-10-CM | POA: Diagnosis not present

## 2020-01-10 DIAGNOSIS — K219 Gastro-esophageal reflux disease without esophagitis: Secondary | ICD-10-CM | POA: Diagnosis not present

## 2020-01-10 DIAGNOSIS — R8290 Unspecified abnormal findings in urine: Secondary | ICD-10-CM | POA: Diagnosis not present

## 2020-02-13 DIAGNOSIS — Z1152 Encounter for screening for COVID-19: Secondary | ICD-10-CM | POA: Diagnosis not present

## 2020-04-23 DIAGNOSIS — K219 Gastro-esophageal reflux disease without esophagitis: Secondary | ICD-10-CM | POA: Diagnosis not present

## 2020-04-23 DIAGNOSIS — J029 Acute pharyngitis, unspecified: Secondary | ICD-10-CM | POA: Diagnosis not present

## 2020-04-23 DIAGNOSIS — R32 Unspecified urinary incontinence: Secondary | ICD-10-CM | POA: Diagnosis not present

## 2020-04-23 DIAGNOSIS — Z6836 Body mass index (BMI) 36.0-36.9, adult: Secondary | ICD-10-CM | POA: Diagnosis not present

## 2020-05-14 DIAGNOSIS — K219 Gastro-esophageal reflux disease without esophagitis: Secondary | ICD-10-CM | POA: Diagnosis not present

## 2020-05-21 ENCOUNTER — Other Ambulatory Visit: Payer: Self-pay | Admitting: Surgery

## 2020-05-21 DIAGNOSIS — K219 Gastro-esophageal reflux disease without esophagitis: Secondary | ICD-10-CM

## 2020-05-27 ENCOUNTER — Ambulatory Visit
Admission: RE | Admit: 2020-05-27 | Discharge: 2020-05-27 | Disposition: A | Discharge status: Home / Self Care | Payer: BC Managed Care – PPO | Source: Ambulatory Visit | Attending: Surgery | Admitting: Surgery

## 2020-05-27 ENCOUNTER — Other Ambulatory Visit: Payer: Self-pay | Admitting: Surgery

## 2020-05-27 DIAGNOSIS — K219 Gastro-esophageal reflux disease without esophagitis: Secondary | ICD-10-CM | POA: Diagnosis not present

## 2020-06-11 DIAGNOSIS — K219 Gastro-esophageal reflux disease without esophagitis: Secondary | ICD-10-CM | POA: Diagnosis not present

## 2020-06-24 ENCOUNTER — Ambulatory Visit: Payer: BC Managed Care – PPO

## 2020-06-30 ENCOUNTER — Other Ambulatory Visit: Payer: Self-pay

## 2020-06-30 ENCOUNTER — Ambulatory Visit
Admission: RE | Admit: 2020-06-30 | Discharge: 2020-06-30 | Disposition: A | Discharge status: Home / Self Care | Payer: BC Managed Care – PPO | Source: Ambulatory Visit | Attending: Internal Medicine | Admitting: Internal Medicine

## 2020-06-30 DIAGNOSIS — Z01810 Encounter for preprocedural cardiovascular examination: Secondary | ICD-10-CM | POA: Diagnosis not present

## 2020-07-22 DIAGNOSIS — F509 Eating disorder, unspecified: Secondary | ICD-10-CM | POA: Diagnosis not present

## 2020-07-24 ENCOUNTER — Encounter: Payer: BC Managed Care – PPO | Attending: Surgery | Admitting: Skilled Nursing Facility1

## 2020-07-29 ENCOUNTER — Other Ambulatory Visit: Payer: Self-pay

## 2020-07-29 ENCOUNTER — Encounter: Payer: BC Managed Care – PPO | Attending: Surgery | Admitting: Skilled Nursing Facility1

## 2020-07-29 DIAGNOSIS — E669 Obesity, unspecified: Secondary | ICD-10-CM | POA: Diagnosis not present

## 2020-07-29 NOTE — Progress Notes (Signed)
Nutrition Assessment for Bariatric Surgery Medical Nutrition Therapy Appt Start Time: 10:55 End Time: 11:55  Patient was seen on 07/29/2020 for Pre-Operative Nutrition Assessment. Letter of approval faxed to [facility] bariatric surgery program coordinator on 07/29/20  Referral stated Supervised Weight Loss (SWL) visits needed: no information given  Planned surgery: conversion sleeve to RYGB Pt expectation of surgery: to relieve GERD and low weight  Pt expectation of dietitian: to help inform me    NUTRITION ASSESSMENT   Anthropometrics  Start weight at [facility]: 234.2 lbs (date: 07/29/2020)  Height: 67 in BMI: 36.68 kg/m2     Clinical  Medical hx: GERD, anxiety Medications: see list Labs: Notable signs/symptoms: reflux, headaches (once weekly) Any previous deficiencies? no  Micronutrient Nutrition Focused Physical Exam: Hair: No issues observed Eyes: No issues observed Mouth: No issues observed Neck: No issues observed Nails: No issues observed Skin: No issues observed  Lifestyle & Dietary Hx  Pt states she is not currently taking any multivitamin or calcium stating she stopped shortly after the sleeve gastrectomy. Pt states she does not like to cook so does not. Pt states she does not like water so she does not drink it.   24-Hr Dietary Recall: states 90% of her meals are eaten outside of the home First Meal: skipped  Snack: Second Meal 12:30-1: skipped or fast food Snack:  fruit Third Meal: half BBQ (eaten out) Snack:  Beverages: diet pepsi   Estimated Energy Needs Calories: 1200  NUTRITION DIAGNOSIS  Overweight/obesity ([place]-3.3) related to past poor dietary habits and physical inactivity as evidenced by patient w/ planned conversion sleeve to RYGB surgery following dietary guidelines for continued weight loss.    NUTRITION INTERVENTION  Nutrition counseling (C-1) and education (E-2) to facilitate bariatric surgery goals.   Pre-Op Goals Reviewed  with the Patient . Track food and beverage intake (pen and paper, MyFitness Pal, Baritastic app, etc.) . Make healthy food choices while monitoring portion sizes . Consume 3 meals per day or try to eat every 3-5 hours . Avoid concentrated sugars and fried foods . Keep sugar & fat in the single digits per serving on food labels . Practice CHEWING your food (aim for applesauce consistency) . Practice not drinking 15 minutes before, during, and 30 minutes after each meal and snack . Avoid all carbonated beverages (ex: soda, sparkling beverages)  . Limit caffeinated beverages (ex: coffee, tea, energy drinks) . Avoid all sugar-sweetened beverages (ex: regular soda, sports drinks)  . Avoid alcohol  . Aim for 64-100 ounces of FLUID daily (with at least half of fluid intake being plain water): add in water + lemon + splenda . Aim for at least 60-80 grams of PROTEIN daily . Look for a liquid protein source that contains ?15 g protein and ?5 g carbohydrate (ex: shakes, drinks, shots) . Make a list of non-food related activities . Physical activity is an important part of a healthy lifestyle so keep it moving! The goal is to reach 150 minutes of exercise per week, including cardiovascular and weight baring activity.  *Goals that are bolded indicate the pt would like to start working towards these  Handouts Provided Include  . Bariatric Surgery handouts (Nutrition Visits, Pre-Op Goals, Protein Shakes, Vitamins & Minerals)  Learning Style & Readiness for Change Teaching method utilized: Visual & Auditory  Demonstrated degree of understanding via: Teach Back  Readiness Level: pre-contemplative Barriers to learning/adherence to lifestyle change: desire to change     MONITORING & EVALUATION Dietary intake, weekly  physical activity, body weight, and pre-op goals reached at next nutrition visit.    Next Steps  Patient is to follow up at [place] for Pre-Op Class >2 weeks before surgery for further  nutrition education.

## 2020-07-30 DIAGNOSIS — F509 Eating disorder, unspecified: Secondary | ICD-10-CM | POA: Diagnosis not present

## 2020-08-18 ENCOUNTER — Encounter: Payer: BC Managed Care – PPO | Admitting: Skilled Nursing Facility1

## 2020-08-18 ENCOUNTER — Other Ambulatory Visit: Payer: Self-pay

## 2020-08-18 DIAGNOSIS — E669 Obesity, unspecified: Secondary | ICD-10-CM

## 2020-08-18 NOTE — Progress Notes (Signed)
Pre-Operative Nutrition Class:  Appt start time: 1730   End time:  1830.  Patient was seen on 08/18/2020 for Pre-Operative Bariatric Surgery Education at [facility]    Surgery date: 09/08/2020 Surgery type: RYGB Start weight at [facility]: 234.2 Weight today:  virtual appt: pt agreed to the limitations of this visit type and identified via name and DOB  The following the learning objectives were met by the patient during this course:  Identify Pre-Op Dietary Goals and will begin 2 weeks pre-operatively  Identify appropriate sources of fluids and proteins   State protein recommendations and appropriate sources pre and post-operatively  Identify Post-Operative Dietary Goals and will follow for 2 weeks post-operatively  Identify appropriate multivitamin and calcium sources  Describe the need for physical activity post-operatively and will follow MD recommendations  State when to call healthcare provider regarding medication questions or post-operative complications  Handouts given during class include:  Pre-Op Bariatric Surgery Diet Handout  Protein Shake Handout  Post-Op Bariatric Surgery Nutrition Handout  BELT Program Information Flyer  Support Group Information Flyer  [facility] Bariatric Supplements Price List  Follow-Up Plan: Patient will follow-up at [facility] 2 weeks post operatively for diet advancement per MD.   

## 2020-08-20 DIAGNOSIS — K219 Gastro-esophageal reflux disease without esophagitis: Secondary | ICD-10-CM | POA: Diagnosis not present

## 2020-08-26 NOTE — Patient Instructions (Addendum)
DUE TO COVID-19 ONLY ONE VISITOR IS ALLOWED TO COME WITH YOU AND STAY IN THE WAITING ROOM ONLY DURING PRE OP AND PROCEDURE DAY OF SURGERY. THE 1 VISITOR  MAY VISIT WITH YOU AFTER SURGERY IN YOUR PRIVATE ROOM DURING VISITING HOURS ONLY!  YOU NEED TO HAVE A COVID 19 TEST ON: 09/04/20 @ 2:00 PM , THIS TEST MUST BE DONE BEFORE SURGERY,  COVID TESTING SITE [place] [address], IT IS ON THE RIGHT GOING OUT [address] APPROXIMATELY  2 MINUTES PAST [employer] ON THE RIGHT. ONCE YOUR COVID TEST IS COMPLETED,  PLEASE BEGIN THE QUARANTINE INSTRUCTIONS AS OUTLINED IN YOUR HANDOUT.                Tammy Wilson   Your procedure is scheduled on: 09/08/20   Report to [facility] Main  Entrance   Report to admitting at: 8:00 AM     Call this number if you have problems the morning of surgery [phone]    Remember:   MORNING OF SURGERY DRINK:   DRINK 1 G2 drink BEFORE YOU LEAVE HOME, DRINK ALL OF THE  G2 DRINK AT ONE TIME.   NO SOLID FOOD AFTER 600 PM THE NIGHT BEFORE YOUR SURGERY. YOU MAY DRINK CLEAR FLUIDS. THE G2 DRINK YOU DRINK BEFORE YOU LEAVE HOME WILL BE THE LAST FLUIDS YOU DRINK BEFORE SURGERY.  PAIN IS EXPECTED AFTER SURGERY AND WILL NOT BE COMPLETELY ELIMINATED. AMBULATION AND TYLENOL WILL HELP REDUCE INCISIONAL AND GAS PAIN. MOVEMENT IS KEY!  YOU ARE EXPECTED TO BE OUT OF BED WITHIN 4 HOURS OF ADMISSION TO YOUR PATIENT ROOM.  SITTING IN THE RECLINER THROUGHOUT THE DAY IS IMPORTANT FOR DRINKING FLUIDS AND MOVING GAS THROUGHOUT THE GI TRACT.  COMPRESSION STOCKINGS SHOULD BE WORN [place] UNLESS YOU ARE WALKING.   INCENTIVE SPIROMETER SHOULD BE USED EVERY HOUR WHILE AWAKE TO DECREASE POST-OPERATIVE COMPLICATIONS SUCH AS PNEUMONIA.  WHEN DISCHARGED HOME, IT IS IMPORTANT TO CONTINUE TO WALK EVERY HOUR AND USE THE INCENTIVE SPIROMETER EVERY HOUR.    BRUSH YOUR TEETH MORNING OF SURGERY AND RINSE YOUR MOUTH OUT, NO CHEWING  GUM CANDY OR MINTS.    Take these medicines the morning of surgery with A SIP OF WATER: bupropion,synthroid,pantoprazole,cytomel.Lorazepam as needed.                               You may not have any metal on your body including hair pins and              piercings  Do not wear jewelry, make-up, lotions, powders or perfumes, deodorant             Do not wear nail polish on your fingernails.  Do not shave  48 hours prior to surgery.    Do not bring valuables to the hospital. [place].  Contacts, dentures or bridgework may not be worn into surgery.  Leave suitcase in the car. After surgery it may be brought to your room.     Patients discharged the day of surgery will not be allowed to drive home. IF YOU ARE HAVING SURGERY AND GOING HOME THE SAME DAY, YOU MUST HAVE AN ADULT TO DRIVE YOU HOME AND BE WITH YOU FOR 24 HOURS. YOU MAY GO HOME BY TAXI  OR UBER OR ORTHERWISE, BUT AN ADULT MUST ACCOMPANY YOU HOME AND STAY WITH YOU FOR 24 HOURS.  Name and phone number of your driver:  Special Instructions: N/A              Please read over the following fact sheets you were given: _____________________________________________________________________        [facility] - Preparing for Surgery Before surgery, you can play an important role.  Because skin is not sterile, your skin needs to be as free of germs as possible.  You can reduce the number of germs on your skin by washing with CHG (chlorahexidine gluconate) soap before surgery.  CHG is an antiseptic cleaner which kills germs and bonds with the skin to continue killing germs even after washing. Please DO NOT use if you have an allergy to CHG or antibacterial soaps.  If your skin becomes reddened/irritated stop using the CHG and inform your nurse when you arrive at Short Stay. Do not shave (including legs and underarms) for at least 48 hours prior to the first CHG shower.  You may shave your  face/neck. Please follow these instructions carefully:  1.  Shower with CHG Soap the night before surgery and the  morning of Surgery.  2.  If you choose to wash your hair, wash your hair first as usual with your  normal  shampoo.  3.  After you shampoo, rinse your hair and body thoroughly to remove the  shampoo.                           4.  Use CHG as you would any other liquid soap.  You can apply chg directly  to the skin and wash                       Gently with a scrungie or clean washcloth.  5.  Apply the CHG Soap to your body ONLY FROM THE NECK DOWN.   Do not use on face/ open                           Wound or open sores. Avoid contact with eyes, ears mouth and genitals (private parts).                       Wash face,  Genitals (private parts) with your normal soap.             6.  Wash thoroughly, paying special attention to the area where your surgery  will be performed.  7.  Thoroughly rinse your body with warm water from the neck down.  8.  DO NOT shower/wash with your normal soap after using and rinsing off  the CHG Soap.                9.  Pat yourself dry with a clean towel.            10.  Wear clean pajamas.            11.  Place clean sheets on your bed the night of your first shower and do not  sleep with pets. Day of Surgery : Do not apply any lotions/deodorants the morning of surgery.  Please wear clean clothes to the hospital/surgery center.  FAILURE TO FOLLOW THESE INSTRUCTIONS MAY RESULT IN THE CANCELLATION OF YOUR SURGERY PATIENT SIGNATURE_________________________________  NURSE  SIGNATURE__________________________________  ________________________________________________________________________

## 2020-08-26 NOTE — Progress Notes (Signed)
Pt. Needs orders for upcomming procedure.PAT and labs on 08/28/20.Thanks.

## 2020-08-28 ENCOUNTER — Ambulatory Visit: Payer: BC Managed Care – PPO | Admitting: Skilled Nursing Facility1

## 2020-08-28 ENCOUNTER — Encounter (HOSPITAL_COMMUNITY)
Admission: RE | Admit: 2020-08-28 | Discharge: 2020-08-28 | Disposition: A | Discharge status: Home / Self Care | Payer: BC Managed Care – PPO | Source: Ambulatory Visit | Attending: Surgery | Admitting: Surgery

## 2020-08-28 ENCOUNTER — Other Ambulatory Visit: Payer: Self-pay

## 2020-08-28 ENCOUNTER — Encounter (HOSPITAL_COMMUNITY): Payer: Self-pay

## 2020-08-28 DIAGNOSIS — Z01812 Encounter for preprocedural laboratory examination: Secondary | ICD-10-CM | POA: Insufficient documentation

## 2020-08-28 HISTORY — DX: Sarcoidosis, unspecified: D86.9

## 2020-08-28 HISTORY — DX: Other specified postprocedural states: Z98.890

## 2020-08-28 HISTORY — DX: Hypothyroidism, unspecified: E03.9

## 2020-08-28 HISTORY — DX: Nausea with vomiting, unspecified: R11.2

## 2020-08-28 LAB — CBC
HCT: 44.1 % (ref 36.0–46.0)
Hemoglobin: 13.9 g/dL (ref 12.0–15.0)
MCH: 28.6 pg (ref 26.0–34.0)
MCHC: 31.5 g/dL (ref 30.0–36.0)
MCV: 90.7 fL (ref 80.0–100.0)
Platelets: 322 10*3/uL (ref 150–400)
RBC: 4.86 MIL/uL (ref 3.87–5.11)
RDW: 13.2 % (ref 11.5–15.5)
WBC: 5.5 10*3/uL (ref 4.0–10.5)
nRBC: 0 % (ref 0.0–0.2)

## 2020-08-28 NOTE — Progress Notes (Signed)
COVID Vaccine Completed: Yes Date COVID Vaccine completed: 05/19/20 [place] COVID vaccine manufacturer: Pfizer     PCP - Dr. Ernestene Kiel Cardiologist -   Chest x-ray -  EKG - 06/30/20 Stress Test -  ECHO -  Cardiac Cath -  Pacemaker/ICD device last checked:  Sleep Study -  CPAP -   Fasting Blood Sugar -  Checks Blood Sugar _____ times a day  Blood Thinner Instructions: Aspirin Instructions: Last Dose:  Anesthesia review:   Patient denies shortness of breath, fever, cough and chest pain at PAT appointment   Patient verbalized understanding of instructions that were given to them at the PAT appointment. Patient was also instructed that they will need to review over the PAT instructions again at home before surgery.

## 2020-09-04 ENCOUNTER — Other Ambulatory Visit (HOSPITAL_COMMUNITY)
Admission: RE | Admit: 2020-09-04 | Discharge: 2020-09-04 | Disposition: A | Discharge status: Home / Self Care | Payer: BC Managed Care – PPO | Source: Ambulatory Visit | Attending: Surgery | Admitting: Surgery

## 2020-09-04 DIAGNOSIS — Z01812 Encounter for preprocedural laboratory examination: Secondary | ICD-10-CM | POA: Diagnosis not present

## 2020-09-04 DIAGNOSIS — Z20822 Contact with and (suspected) exposure to covid-19: Secondary | ICD-10-CM | POA: Diagnosis not present

## 2020-09-04 LAB — SARS CORONAVIRUS 2 (TAT 6-24 HRS): SARS Coronavirus 2: NEGATIVE

## 2020-09-07 MED ORDER — BUPIVACAINE LIPOSOME 1.3 % IJ SUSP
20.0000 mL | Freq: Once | INTRAMUSCULAR | Status: DC
  Filled 2020-09-07: qty 20

## 2020-09-08 ENCOUNTER — Encounter (HOSPITAL_COMMUNITY)
Admission: RE | Disposition: A | Discharge status: Home / Self Care | Payer: Self-pay | Source: Ambulatory Visit | Attending: Surgery

## 2020-09-08 ENCOUNTER — Inpatient Hospital Stay (HOSPITAL_COMMUNITY)
Admission: RE | Admit: 2020-09-08 | Discharge: 2020-09-10 | DRG: 621 | Disposition: A | Discharge status: Home / Self Care | Payer: BC Managed Care – PPO | Source: Ambulatory Visit | Attending: Surgery | Admitting: Surgery

## 2020-09-08 ENCOUNTER — Other Ambulatory Visit: Payer: Self-pay

## 2020-09-08 ENCOUNTER — Inpatient Hospital Stay (HOSPITAL_COMMUNITY): Payer: BC Managed Care – PPO | Admitting: Registered Nurse

## 2020-09-08 ENCOUNTER — Encounter (HOSPITAL_COMMUNITY): Payer: Self-pay | Admitting: Surgery

## 2020-09-08 DIAGNOSIS — Z87442 Personal history of urinary calculi: Secondary | ICD-10-CM | POA: Diagnosis not present

## 2020-09-08 DIAGNOSIS — D869 Sarcoidosis, unspecified: Secondary | ICD-10-CM | POA: Diagnosis present

## 2020-09-08 DIAGNOSIS — K449 Diaphragmatic hernia without obstruction or gangrene: Secondary | ICD-10-CM | POA: Diagnosis not present

## 2020-09-08 DIAGNOSIS — Z6835 Body mass index (BMI) 35.0-35.9, adult: Secondary | ICD-10-CM | POA: Diagnosis not present

## 2020-09-08 DIAGNOSIS — E890 Postprocedural hypothyroidism: Secondary | ICD-10-CM | POA: Diagnosis not present

## 2020-09-08 DIAGNOSIS — F419 Anxiety disorder, unspecified: Secondary | ICD-10-CM | POA: Diagnosis not present

## 2020-09-08 DIAGNOSIS — Z90710 Acquired absence of both cervix and uterus: Secondary | ICD-10-CM | POA: Diagnosis not present

## 2020-09-08 DIAGNOSIS — K219 Gastro-esophageal reflux disease without esophagitis: Secondary | ICD-10-CM | POA: Diagnosis not present

## 2020-09-08 DIAGNOSIS — Z85828 Personal history of other malignant neoplasm of skin: Secondary | ICD-10-CM | POA: Diagnosis not present

## 2020-09-08 DIAGNOSIS — Z9884 Bariatric surgery status: Secondary | ICD-10-CM

## 2020-09-08 HISTORY — PX: UPPER GI ENDOSCOPY: SHX6162

## 2020-09-08 HISTORY — PX: GASTRIC ROUX-EN-Y: SHX5262

## 2020-09-08 HISTORY — PX: HIATAL HERNIA REPAIR: SHX195

## 2020-09-08 LAB — COMPREHENSIVE METABOLIC PANEL
ALT: 18 U/L (ref 0–44)
AST: 28 U/L (ref 15–41)
Albumin: 4 g/dL (ref 3.5–5.0)
Alkaline Phosphatase: 44 U/L (ref 38–126)
Anion gap: 14 (ref 5–15)
BUN: 17 mg/dL (ref 6–20)
CO2: 20 mmol/L — ABNORMAL LOW (ref 22–32)
Calcium: 8.9 mg/dL (ref 8.9–10.3)
Chloride: 104 mmol/L (ref 98–111)
Creatinine, Ser: 0.69 mg/dL (ref 0.44–1.00)
GFR, Estimated: >60 mL/min (ref >60)
Glucose, Bld: 109 mg/dL — ABNORMAL HIGH (ref 70–99)
Potassium: 3.9 mmol/L (ref 3.5–5.1)
Sodium: 138 mmol/L (ref 135–145)
Total Bilirubin: 0.5 mg/dL (ref 0.3–1.2)
Total Protein: 7.6 g/dL (ref 6.5–8.1)

## 2020-09-08 LAB — CBC WITH DIFFERENTIAL/PLATELET
Abs Immature Granulocytes: 0.04 10*3/uL (ref 0.00–0.07)
Basophils Absolute: 0 10*3/uL (ref 0.0–0.1)
Basophils Relative: 1 %
Eosinophils Absolute: 0.1 10*3/uL (ref 0.0–0.5)
Eosinophils Relative: 2 %
HCT: 41.7 % (ref 36.0–46.0)
Hemoglobin: 13.4 g/dL (ref 12.0–15.0)
Immature Granulocytes: 1 %
Lymphocytes Relative: 24 %
Lymphs Abs: 1.3 10*3/uL (ref 0.7–4.0)
MCH: 28.7 pg (ref 26.0–34.0)
MCHC: 32.1 g/dL (ref 30.0–36.0)
MCV: 89.3 fL (ref 80.0–100.0)
Monocytes Absolute: 0.6 10*3/uL (ref 0.1–1.0)
Monocytes Relative: 11 %
Neutro Abs: 3.4 10*3/uL (ref 1.7–7.7)
Neutrophils Relative %: 61 %
Platelets: 297 10*3/uL (ref 150–400)
RBC: 4.67 MIL/uL (ref 3.87–5.11)
RDW: 13.4 % (ref 11.5–15.5)
WBC: 5.5 10*3/uL (ref 4.0–10.5)
nRBC: 0 % (ref 0.0–0.2)

## 2020-09-08 LAB — TYPE AND SCREEN
ABO/RH(D): A POS
Antibody Screen: NEGATIVE

## 2020-09-08 LAB — HEMOGLOBIN AND HEMATOCRIT, BLOOD
HCT: 40 % (ref 36.0–46.0)
Hemoglobin: 12.8 g/dL (ref 12.0–15.0)

## 2020-09-08 SURGERY — LAPAROSCOPIC ROUX-EN-Y GASTRIC BYPASS WITH UPPER ENDOSCOPY
Anesthesia: General

## 2020-09-08 MED ORDER — DEXAMETHASONE SODIUM PHOSPHATE 10 MG/ML IJ SOLN
INTRAMUSCULAR | Status: AC
  Filled 2020-09-08: qty 1

## 2020-09-08 MED ORDER — ALBUMIN HUMAN 5 % IV SOLN
INTRAVENOUS | Status: AC
  Filled 2020-09-08: qty 250

## 2020-09-08 MED ORDER — KETAMINE HCL 10 MG/ML IJ SOLN
INTRAMUSCULAR | Status: AC
  Filled 2020-09-08: qty 1

## 2020-09-08 MED ORDER — KCL IN DEXTROSE-NACL 20-5-0.45 MEQ/L-%-% IV SOLN
INTRAVENOUS | Status: DC
  Filled 2020-09-08 (×3): qty 1000

## 2020-09-08 MED ORDER — HEPARIN SODIUM (PORCINE) 5000 UNIT/ML IJ SOLN
5000.0000 [IU] | Freq: Three times a day (TID) | INTRAMUSCULAR | Status: DC
  Administered 2020-09-08 – 2020-09-09 (×4): 5000 [IU] via SUBCUTANEOUS
  Filled 2020-09-08 (×5): qty 1

## 2020-09-08 MED ORDER — ENSURE MAX PROTEIN PO LIQD
2.0000 [oz_av] | ORAL | Status: DC
  Administered 2020-09-09 (×7): 2 [oz_av] via ORAL

## 2020-09-08 MED ORDER — PROPOFOL 10 MG/ML IV BOLUS
INTRAVENOUS | Status: AC
  Filled 2020-09-08: qty 20

## 2020-09-08 MED ORDER — KETAMINE HCL 10 MG/ML IJ SOLN
INTRAMUSCULAR | Status: DC | PRN
  Administered 2020-09-08: 30 mg via INTRAVENOUS

## 2020-09-08 MED ORDER — SCOPOLAMINE 1 MG/3DAYS TD PT72
1.0000 | MEDICATED_PATCH | TRANSDERMAL | Status: DC
  Administered 2020-09-08: 1.5 mg via TRANSDERMAL
  Filled 2020-09-08: qty 1

## 2020-09-08 MED ORDER — DEXAMETHASONE SODIUM PHOSPHATE 10 MG/ML IJ SOLN
INTRAMUSCULAR | Status: DC | PRN
  Administered 2020-09-08: 8 mg via INTRAVENOUS

## 2020-09-08 MED ORDER — LACTATED RINGERS IR SOLN
Status: DC | PRN
  Administered 2020-09-08: 3000 mL

## 2020-09-08 MED ORDER — CHLORHEXIDINE GLUCONATE CLOTH 2 % EX PADS: 6.0000 | MEDICATED_PAD | Freq: Once | CUTANEOUS | Status: DC

## 2020-09-08 MED ORDER — FENTANYL CITRATE (PF) 100 MCG/2ML IJ SOLN
INTRAMUSCULAR | Status: AC
  Filled 2020-09-08: qty 2

## 2020-09-08 MED ORDER — AMISULPRIDE (ANTIEMETIC) 5 MG/2ML IV SOLN: 10.0000 mg | Freq: Once | INTRAVENOUS | Status: DC | PRN

## 2020-09-08 MED ORDER — LIDOCAINE 2% (20 MG/ML) 5 ML SYRINGE
INTRAMUSCULAR | Status: AC
  Filled 2020-09-08: qty 5

## 2020-09-08 MED ORDER — 0.9 % SODIUM CHLORIDE (POUR BTL) OPTIME
TOPICAL | Status: DC | PRN
  Administered 2020-09-08: 1000 mL

## 2020-09-08 MED ORDER — ACETAMINOPHEN 500 MG PO TABS: 1000.0000 mg | ORAL_TABLET | ORAL | Status: DC

## 2020-09-08 MED ORDER — CHLORHEXIDINE GLUCONATE 0.12 % MT SOLN
15.0000 mL | Freq: Once | OROMUCOSAL | Status: AC
  Administered 2020-09-08: 15 mL via OROMUCOSAL

## 2020-09-08 MED ORDER — APREPITANT 40 MG PO CAPS
40.0000 mg | ORAL_CAPSULE | ORAL | Status: AC
  Administered 2020-09-08: 40 mg via ORAL
  Filled 2020-09-08: qty 1

## 2020-09-08 MED ORDER — SODIUM CHLORIDE (PF) 0.9 % IJ SOLN
INTRAMUSCULAR | Status: DC | PRN
  Administered 2020-09-08: 10 mL via INTRAVENOUS

## 2020-09-08 MED ORDER — GABAPENTIN 300 MG PO CAPS
300.0000 mg | ORAL_CAPSULE | ORAL | Status: AC
  Administered 2020-09-08: 300 mg via ORAL
  Filled 2020-09-08: qty 1

## 2020-09-08 MED ORDER — METOPROLOL TARTRATE 5 MG/5ML IV SOLN
5.0000 mg | Freq: Four times a day (QID) | INTRAVENOUS | Status: DC | PRN
  Filled 2020-09-08: qty 5

## 2020-09-08 MED ORDER — ORAL CARE MOUTH RINSE: 15.0000 mL | Freq: Once | OROMUCOSAL | Status: AC

## 2020-09-08 MED ORDER — LACTATED RINGERS IV SOLN: INTRAVENOUS | Status: DC

## 2020-09-08 MED ORDER — ROCURONIUM BROMIDE 10 MG/ML (PF) SYRINGE
PREFILLED_SYRINGE | INTRAVENOUS | Status: DC | PRN
  Administered 2020-09-08: 100 mg via INTRAVENOUS
  Administered 2020-09-08: 10 mg via INTRAVENOUS
  Administered 2020-09-08: 20 mg via INTRAVENOUS

## 2020-09-08 MED ORDER — ACETAMINOPHEN 160 MG/5ML PO SOLN
1000.0000 mg | Freq: Three times a day (TID) | ORAL | Status: DC
  Filled 2020-09-08: qty 40.6

## 2020-09-08 MED ORDER — FENTANYL CITRATE (PF) 250 MCG/5ML IJ SOLN
INTRAMUSCULAR | Status: DC | PRN
  Administered 2020-09-08: 100 ug via INTRAVENOUS

## 2020-09-08 MED ORDER — PROPOFOL 10 MG/ML IV BOLUS
INTRAVENOUS | Status: DC | PRN
  Administered 2020-09-08: 200 mg via INTRAVENOUS

## 2020-09-08 MED ORDER — PANTOPRAZOLE SODIUM 40 MG IV SOLR
40.0000 mg | Freq: Every day | INTRAVENOUS | Status: DC
  Administered 2020-09-08 – 2020-09-09 (×2): 40 mg via INTRAVENOUS
  Filled 2020-09-08 (×2): qty 40

## 2020-09-08 MED ORDER — LIDOCAINE 2% (20 MG/ML) 5 ML SYRINGE
INTRAMUSCULAR | Status: DC | PRN
  Administered 2020-09-08: 100 mg via INTRAVENOUS

## 2020-09-08 MED ORDER — ONDANSETRON HCL 4 MG/2ML IJ SOLN
4.0000 mg | INTRAMUSCULAR | Status: DC | PRN
  Administered 2020-09-08: 4 mg via INTRAVENOUS
  Filled 2020-09-08: qty 2

## 2020-09-08 MED ORDER — ROCURONIUM BROMIDE 10 MG/ML (PF) SYRINGE
PREFILLED_SYRINGE | INTRAVENOUS | Status: AC
  Filled 2020-09-08: qty 10

## 2020-09-08 MED ORDER — MORPHINE SULFATE (PF) 4 MG/ML IV SOLN
1.0000 mg | INTRAVENOUS | Status: DC | PRN
  Administered 2020-09-08: 3 mg via INTRAVENOUS
  Filled 2020-09-08: qty 1

## 2020-09-08 MED ORDER — TISSEEL VH 10 ML EX KIT
PACK | CUTANEOUS | Status: DC | PRN
  Administered 2020-09-08 (×2): 10 mL

## 2020-09-08 MED ORDER — BUPIVACAINE LIPOSOME 1.3 % IJ SUSP
INTRAMUSCULAR | Status: DC | PRN
  Administered 2020-09-08: 20 mL

## 2020-09-08 MED ORDER — OXYCODONE HCL 5 MG/5ML PO SOLN
5.0000 mg | Freq: Four times a day (QID) | ORAL | Status: DC | PRN
  Administered 2020-09-09: 5 mg via ORAL
  Filled 2020-09-08: qty 5

## 2020-09-08 MED ORDER — SODIUM CHLORIDE 0.9 % IV SOLN
2.0000 g | INTRAVENOUS | Status: AC
  Administered 2020-09-08: 2 g via INTRAVENOUS
  Filled 2020-09-08: qty 2

## 2020-09-08 MED ORDER — LIDOCAINE HCL (PF) 2 % IJ SOLN
INTRAMUSCULAR | Status: DC | PRN
  Administered 2020-09-08: 1.5 mg/kg/h via INTRADERMAL

## 2020-09-08 MED ORDER — ACETAMINOPHEN 500 MG PO TABS
1000.0000 mg | ORAL_TABLET | Freq: Once | ORAL | Status: AC
  Administered 2020-09-08: 1000 mg via ORAL
  Filled 2020-09-08: qty 2

## 2020-09-08 MED ORDER — HEPARIN SODIUM (PORCINE) 5000 UNIT/ML IJ SOLN
5000.0000 [IU] | INTRAMUSCULAR | Status: AC
  Administered 2020-09-08: 5000 [IU] via SUBCUTANEOUS
  Filled 2020-09-08: qty 1

## 2020-09-08 MED ORDER — ONDANSETRON HCL 4 MG/2ML IJ SOLN
INTRAMUSCULAR | Status: AC
  Filled 2020-09-08: qty 2

## 2020-09-08 MED ORDER — TISSEEL VH 10 ML EX KIT
PACK | CUTANEOUS | Status: AC
  Filled 2020-09-08: qty 2

## 2020-09-08 MED ORDER — SODIUM CHLORIDE (PF) 0.9 % IJ SOLN
INTRAMUSCULAR | Status: AC
  Filled 2020-09-08: qty 10

## 2020-09-08 MED ORDER — MIDAZOLAM HCL 5 MG/5ML IJ SOLN
INTRAMUSCULAR | Status: DC | PRN
  Administered 2020-09-08: 2 mg via INTRAVENOUS

## 2020-09-08 MED ORDER — PHENYLEPHRINE 40 MCG/ML (10ML) SYRINGE FOR IV PUSH (FOR BLOOD PRESSURE SUPPORT)
PREFILLED_SYRINGE | INTRAVENOUS | Status: DC | PRN
  Administered 2020-09-08: 120 ug via INTRAVENOUS
  Administered 2020-09-08: 80 ug via INTRAVENOUS

## 2020-09-08 MED ORDER — SUGAMMADEX SODIUM 200 MG/2ML IV SOLN
INTRAVENOUS | Status: DC | PRN
  Administered 2020-09-08: 200 mg via INTRAVENOUS

## 2020-09-08 MED ORDER — FENTANYL CITRATE (PF) 100 MCG/2ML IJ SOLN: 25.0000 ug | INTRAMUSCULAR | Status: DC | PRN

## 2020-09-08 MED ORDER — ONDANSETRON HCL 4 MG/2ML IJ SOLN
INTRAMUSCULAR | Status: DC | PRN
  Administered 2020-09-08: 4 mg via INTRAVENOUS

## 2020-09-08 MED ORDER — MIDAZOLAM HCL 2 MG/2ML IJ SOLN
INTRAMUSCULAR | Status: AC
  Filled 2020-09-08: qty 2

## 2020-09-08 MED ORDER — ACETAMINOPHEN 500 MG PO TABS
1000.0000 mg | ORAL_TABLET | Freq: Three times a day (TID) | ORAL | Status: DC
  Administered 2020-09-08 – 2020-09-09 (×4): 1000 mg via ORAL
  Filled 2020-09-08 (×5): qty 2

## 2020-09-08 MED ORDER — LIDOCAINE 2% (20 MG/ML) 5 ML SYRINGE
INTRAMUSCULAR | Status: AC
  Filled 2020-09-08: qty 10

## 2020-09-08 MED ORDER — ALBUMIN HUMAN 5 % IV SOLN: INTRAVENOUS | Status: DC | PRN

## 2020-09-08 MED ORDER — SODIUM CHLORIDE 0.9 % IV SOLN
12.5000 mg | Freq: Four times a day (QID) | INTRAVENOUS | Status: DC | PRN
  Filled 2020-09-08: qty 0.5

## 2020-09-08 MED ORDER — PHENYLEPHRINE HCL-NACL 10-0.9 MG/250ML-% IV SOLN
INTRAVENOUS | Status: DC | PRN
  Administered 2020-09-08: 20 ug/min via INTRAVENOUS

## 2020-09-08 SURGICAL SUPPLY — 65 items
APPLICATOR COTTON TIP 6 STRL (MISCELLANEOUS) IMPLANT
APPLICATOR COTTON TIP 6IN STRL (MISCELLANEOUS)
APPLIER CLIP ROT 10 11.4 M/L (STAPLE)
APPLIER CLIP ROT 13.4 12 LRG (CLIP)
BLADE SURG 15 STRL LF DISP TIS (BLADE) ×1 IMPLANT
BLADE SURG 15 STRL SS (BLADE) ×1
CABLE HIGH FREQUENCY MONO STRZ (ELECTRODE) IMPLANT
CLIP APPLIE ROT 10 11.4 M/L (STAPLE) IMPLANT
CLIP APPLIE ROT 13.4 12 LRG (CLIP) IMPLANT
CLIP SUT LAPRA TY ABSORB (SUTURE) ×6 IMPLANT
COVER WAND RF STERILE (DRAPES) IMPLANT
DERMABOND ADVANCED (GAUZE/BANDAGES/DRESSINGS) ×1
DERMABOND ADVANCED .7 DNX12 (GAUZE/BANDAGES/DRESSINGS) ×1 IMPLANT
DEVICE SUT QUICK LOAD TK 5 (STAPLE) ×4 IMPLANT
DEVICE SUT TI-KNOT TK 5X26 (MISCELLANEOUS) ×2 IMPLANT
DEVICE SUTURE ENDOST 10MM (ENDOMECHANICALS) ×2 IMPLANT
DISSECTOR BLUNT TIP ENDO 5MM (MISCELLANEOUS) ×2 IMPLANT
DRAIN PENROSE 0.25X18 (DRAIN) ×2 IMPLANT
GAUZE 4X4 16PLY RFD (DISPOSABLE) ×2 IMPLANT
GLOVE BIOGEL M 8.0 STRL (GLOVE) ×2 IMPLANT
GOWN STRL REUS W/TWL XL LVL3 (GOWN DISPOSABLE) ×4 IMPLANT
HANDLE STAPLE EGIA 4 XL (STAPLE) ×2 IMPLANT
KIT BASIN OR (CUSTOM PROCEDURE TRAY) ×2 IMPLANT
KIT GASTRIC LAVAGE 34FR ADT (SET/KITS/TRAYS/PACK) ×2 IMPLANT
KIT TURNOVER KIT A (KITS) ×2 IMPLANT
MARKER SKIN DUAL TIP RULER LAB (MISCELLANEOUS) ×2 IMPLANT
MAT PREVALON FULL STRYKER (MISCELLANEOUS) ×2 IMPLANT
NEEDLE SPNL 22GX3.5 QUINCKE BK (NEEDLE) ×2 IMPLANT
PACK CARDIOVASCULAR III (CUSTOM PROCEDURE TRAY) ×2 IMPLANT
PENCIL SMOKE EVACUATOR (MISCELLANEOUS) IMPLANT
RELOAD EGIA 45 MED/THCK PURPLE (STAPLE) ×2 IMPLANT
RELOAD EGIA 45 TAN VASC (STAPLE) IMPLANT
RELOAD EGIA 60 MED/THCK PURPLE (STAPLE) ×2 IMPLANT
RELOAD EGIA 60 TAN VASC (STAPLE) ×4 IMPLANT
RELOAD ENDO STITCH 2.0 (ENDOMECHANICALS) ×10
RELOAD TRI 45 ART MED THCK PUR (STAPLE) IMPLANT
RELOAD TRI 60 ART MED THCK PUR (STAPLE) IMPLANT
SCISSORS LAP 5X45 EPIX DISP (ENDOMECHANICALS) ×2 IMPLANT
SEALANT SURGICAL APPL DUAL CAN (MISCELLANEOUS) ×2 IMPLANT
SET IRRIG TUBING LAPAROSCOPIC (IRRIGATION / IRRIGATOR) ×2 IMPLANT
SET TUBE SMOKE EVAC HIGH FLOW (TUBING) ×2 IMPLANT
SHEARS HARMONIC ACE PLUS 45CM (MISCELLANEOUS) ×2 IMPLANT
SLEEVE ADV FIXATION 12X100MM (TROCAR) ×4 IMPLANT
SLEEVE ADV FIXATION 5X100MM (TROCAR) IMPLANT
SOL ANTI FOG 6CC (MISCELLANEOUS) ×1 IMPLANT
SOLUTION ANTI FOG 6CC (MISCELLANEOUS) ×1
STAPLER VISISTAT 35W (STAPLE) IMPLANT
SUT MNCRL AB 4-0 PS2 18 (SUTURE) ×6 IMPLANT
SUT RELOAD ENDO STITCH 2 48X1 (ENDOMECHANICALS) ×6
SUT RELOAD ENDO STITCH 2.0 (ENDOMECHANICALS) ×4
SUT SURGIDAC NAB ES-9 0 48 120 (SUTURE) ×4 IMPLANT
SUT VIC AB 2-0 SH 27 (SUTURE)
SUT VIC AB 2-0 SH 27X BRD (SUTURE) IMPLANT
SUTURE RELOAD END STTCH 2 48X1 (ENDOMECHANICALS) ×6 IMPLANT
SUTURE RELOAD ENDO STITCH 2.0 (ENDOMECHANICALS) ×4 IMPLANT
SYR 10ML ECCENTRIC (SYRINGE) ×2 IMPLANT
SYR 20ML LL LF (SYRINGE) ×4 IMPLANT
TOWEL OR 17X26 10 PK STRL BLUE (TOWEL DISPOSABLE) ×2 IMPLANT
TOWEL OR NON WOVEN STRL DISP B (DISPOSABLE) ×2 IMPLANT
TRAY FOLEY MTR SLVR 16FR STAT (SET/KITS/TRAYS/PACK) ×2 IMPLANT
TROCAR ADV FIXATION 12X100MM (TROCAR) ×2 IMPLANT
TROCAR ADV FIXATION 5X100MM (TROCAR) ×2 IMPLANT
TROCAR BLADELESS OPT 5 100 (ENDOMECHANICALS) ×2 IMPLANT
TROCAR XCEL 12X100 BLDLESS (ENDOMECHANICALS) ×2 IMPLANT
TUBING CONNECTING 10 (TUBING) ×4 IMPLANT

## 2020-09-08 NOTE — Progress Notes (Signed)
Water started.

## 2020-09-08 NOTE — Progress Notes (Signed)
Discussed post op day goals with patient including ambulation, IS, diet progression, pain, and nausea control.  BSTOP education provided including BSTOP information guide, "Guide for Pain Management after your Bariatric Procedure".  Questions answered. 

## 2020-09-08 NOTE — Op Note (Signed)
08 September 2020 Surgeon: Catalina Antigua B. Hassell Done, MD, FACS Asst:  Romana Juniper, MD, FACS Anesthesia: General endotracheal Drains: None  Procedure: Dissection and closure of hiatal hernia (2 suture posterior) Laparoscopic Roux en Y gastric bypass with 40 cm BP limb and 100 cm Roux limb, antecolic, antegastric, candy cane to the left.  Closure of Peterson's defect. Upper endoscopy.   Description of Procedure:  The patient was taken to OR 1 at [facility] and given general anesthesia.  The abdomen was prepped with PCMX and draped sterilely.  A time out was performed.    Foregut dissection demonstrated a small hiatal hernia which was reduced and two suture surgidek closure of the hiatus was performed posteriorly.  5 cm down on the gastric pouch an opening was made behind the stomach sleeve and I divided the sleeve with a firing of the 6 cm purple and 4.5 cm purple.    We then began by identifying the ligament of Treitz. I measured 40 cm downstream and divided the bowel with a 6 cm Covidian stapler.  I sutured a Penrose drain along the Roux limb end.  I measured a 1 meter (100 cm) Roux limb and then placed the distal bowels to the BP limb side by side and performed a stapled jejunojejunostomy. The common defect was closed from either end with 4-0 Vicryl using the Endo Stitch. The mesenteric defect was closed with a running 2-0 silk using the Endo Stitch. Tisseel was applied to the suture line.  The omentum was divided with the harmonic scalpel.  The Nathanson retractor was inserted in the left lateral segment of liver was retracted.   The Roux limb was then brought up with the candycane pointed left and a back row of sutures of 2-0 Vicryl were placed. I opened along the right side of each structure and inserted the 4.5 cm stapler to create the gastrojejunostomy. The common defect was closed from either end with 2-0 Vicryl and a second row was placed anterior to that the Ewald tube acting as a stent across the  anastomosis. The Penrose drain was removed. Peterson's defect was closed with 2-0 silk.   Endoscopy was performed by Dr. Kae Heller.   The incisions were injected with Exparel TAP block bilaterally and were closed with 4-0 Monocryl and Dermabond.    The patient was taken to the recovery room in satisfactory condition.  Matt B. Hassell Done, MD, FACS

## 2020-09-08 NOTE — Op Note (Signed)
Preoperative diagnosis: Roux-en-Y gastric bypass  Postoperative diagnosis: Same   Procedure: Upper endoscopy   Surgeon: Clovis Riley, M.D.  Anesthesia: Gen.   Description of procedure: The endoscope was placed in the mouth and oropharynx and under endoscopic vision it was advanced to the esophagogastric junction which was identified at 40cm from the teeth.  The pouch was tensely insufflated while the roux limb was occluded and the upper abdomen was flooded with irrigation by [name] to perform a leak test, which was negative. No bubbles were seen.  The staple line was hemostatic and the lumen was evenly tubular with a visibly patent anastomosis which measured 45cm from the teeth. The lumen was decompressed and the scope was withdrawn without difficulty.    Clovis Riley, M.D. General, Bariatric, & Minimally Invasive Surgery [facility]

## 2020-09-08 NOTE — Progress Notes (Signed)
PHARMACY CONSULT FOR:  Risk Assessment for Post-Discharge VTE Following Bariatric Surgery  Post-Discharge VTE Risk Assessment: This patient's probability of 30-day post-discharge VTE is increased due to the factors marked:   Female    Age >/=60 years    BMI >/=50 kg/m2    CHF    Dyspnea at Rest    Paraplegia  X  Non-gastric-band surgery    Operation Time >/=3 hr    Return to OR     Length of Stay >/= 3 d   Hx of VTE   Hypercoagulable condition   Significant venous stasis       Predicted probability of 30-day post-discharge VTE: 0.16%   Recommendation for Discharge: No pharmacologic prophylaxis post-discharge   Tammy Wilson is a 55 y.o. female who underwent laparoscopic Roux en Y gastric bypass and dissection and closure of hiatal hernia on 09/08/20   Case start: 1017 Case end: 1245 ____________________________________________________  Allergies  Allergen Reactions  . Dilaudid [Hydromorphone Hcl] Palpitations  . Sulfamethoxazole-Trimethoprim Rash    Nausea and vomitting    Patient Measurements: Weight: 103.9 kg (229 lb) Body mass index is 35.87 kg/m.  Recent Labs    09/08/20 0806  WBC 5.5  HGB 13.4  HCT 41.7  PLT 297  CREATININE 0.69  ALBUMIN 4.0  PROT 7.6  AST 28  ALT 18  ALKPHOS 44  BILITOT 0.5   Estimated Creatinine Clearance: 99.6 mL/min (by C-G formula based on SCr of 0.69 mg/dL).    Past Medical History:  Diagnosis Date  . Anxiety   . 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 Complication of anesthesia    "itching extremely bad"  . GERD (gastroesophageal reflux disease)   . History of kidney stones    x3- x2 lithotripsy, passed one on own  . Hypothyroidism   . PONV (postoperative nausea and vomiting)   . Sarcoidosis      Medications Prior to Admission  Medication Sig Dispense Refill Last Dose  . acetaminophen (TYLENOL) 500 MG tablet Take 500 mg by mouth every 6 (six)  hours as needed.   Past Month at Unknown time  . buPROPion (WELLBUTRIN XL) 300 MG 24 hr tablet Take 300 mg by mouth daily.   09/08/2020 at Unknown time  . levothyroxine (SYNTHROID) 112 MCG tablet Take 112 mcg by mouth every evening.   09/07/2020 at Unknown time  . liothyronine (CYTOMEL) 5 MCG tablet Take 5 mcg by mouth daily before breakfast.   09/08/2020 at Unknown time  . LORazepam (ATIVAN) 1 MG tablet Take 1 mg by mouth 3 (three) times daily as needed for anxiety.   Past Month at Unknown time  . pantoprazole (PROTONIX) 40 MG tablet Take 40 mg by mouth daily.   09/08/2020 at Unknown time  . venlafaxine XR (EFFEXOR-XR) 150 MG 24 hr capsule Take 150 mg by mouth daily. with food   09/08/2020 at Unknown time       Lynelle Doctor 09/08/2020,2:02 PM

## 2020-09-08 NOTE — H&P (Signed)
Chief Complaint:  Refractory GERD with sleeve gastrectomy  History of Present Illness:  Tammy Wilson is an 55 y.o. female who had a sleeve gastrectomy in 2016 and has developed refractory GER and a hiatal hernia.  She presents for lap conversion of sleeve to roux en Y gastric bypass.  Past Medical History:  Diagnosis Date  . Anxiety   . 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 Complication of anesthesia    "itching extremely bad"  . GERD (gastroesophageal reflux disease)   . History of kidney stones    x3- x2 lithotripsy, passed one on own  . Hypothyroidism   . PONV (postoperative nausea and vomiting)   . Sarcoidosis     Past Surgical History:  Procedure Laterality Date  . ABDOMINAL HYSTERECTOMY     laparoscopic  . BLADDER SUSPENSION     done with hysterectomy, 2'16 sling redone.  . bladder tack    . LAPAROSCOPIC GASTRIC SLEEVE RESECTION N/A 06/09/2015   Procedure: LAPAROSCOPIC GASTRIC SLEEVE RESECTION;  Surgeon: Johnathan Hausen, MD;  Location: [facility] ORS;  Service: General;  Laterality: N/A;  . NECK SURGERY     Cervial fusion '12-[facility] - Dr. Annette Stable  . SINUSOTOMY    . TUBAL LIGATION    . UPPER GI ENDOSCOPY N/A 06/09/2015   Procedure: UPPER GI ENDOSCOPY;  Surgeon: Johnathan Hausen, MD;  Location: [facility] ORS;  Service: General;  Laterality: N/A;    Current Facility-Administered Medications  Medication Dose Route Frequency Provider Last Rate Last Admin  . bupivacaine liposome (EXPAREL) 1.3 % injection 266 mg  20 mL Infiltration Once Johnathan Hausen, MD      . cefoTEtan (CEFOTAN) 2 g in sodium chloride 0.9 % 100 mL IVPB  2 g Intravenous On Call to OR Johnathan Hausen, MD      . Chlorhexidine Gluconate Cloth 2 % PADS 6 each  6 each Topical Once Johnathan Hausen, MD      . lactated ringers infusion   Intravenous Continuous Myrtie Soman, MD 10 mL/hr at 09/08/20 0830 New Bag at 09/08/20 0830  . scopolamine (TRANSDERM-SCOP) 1 MG/3DAYS  1.5 mg  1 patch Transdermal On Call to OR Johnathan Hausen, MD   1.5 mg at 09/08/20 0846   Dilaudid [hydromorphone hcl] and Sulfamethoxazole-trimethoprim No family history on file. Social History:   reports that she has never smoked. She has never used smokeless tobacco. She reports current alcohol use. She reports that she does not use drugs.   REVIEW OF SYSTEMS : Negative except for see above  Physical Exam:   Blood pressure (!) 152/98, pulse 97, temperature 98.1 F (36.7 C), temperature source Oral, resp. rate 18, weight 103.9 kg, SpO2 96 %. Body mass index is 35.87 kg/m.  Gen:  WDWN WF NAD  Neurological: Alert and oriented to person, place, and time. Motor and sensory function is grossly intact  Head: Normocephalic and atraumatic.  Eyes: Conjunctivae are normal. Pupils are equal, round, and reactive to light. No scleral icterus.  Neck: Normal range of motion. Neck supple. No tracheal deviation or thyromegaly present.  Cardiovascular:  SR without murmurs or gallops.  No carotid bruits Breast:  Not examined Respiratory: Effort normal.  No respiratory distress. No chest wall tenderness. Breath sounds normal.  No wheezes, rales or rhonchi.  Abdomen:  nontender with well healed incisions GU:  Not examined Musculoskeletal: Normal range of motion. Extremities are nontender. No cyanosis, edema or clubbing noted Lymphadenopathy: No cervical,  preauricular, postauricular or axillary adenopathy is present Skin: Skin is warm and dry. No rash noted. No diaphoresis. No erythema. No pallor. Pscyh: Normal mood and affect. Behavior is normal. Judgment and thought content normal.   LABORATORY RESULTS: Results for orders placed or performed during the hospital encounter of 09/08/20 (from the past 48 hour(s))  CBC WITH DIFFERENTIAL     Status: None   Collection Time: 09/08/20  8:06 AM  Result Value Ref Range   WBC 5.5 4.0 - 10.5 K/uL   RBC 4.67 3.87 - 5.11 MIL/uL   Hemoglobin 13.4 12.0 - 15.0 g/dL    HCT 41.7 36.0 - 46.0 %   MCV 89.3 80.0 - 100.0 fL   MCH 28.7 26.0 - 34.0 pg   MCHC 32.1 30.0 - 36.0 g/dL   RDW 13.4 11.5 - 15.5 %   Platelets 297 150 - 400 K/uL   nRBC 0.0 0.0 - 0.2 %   Neutrophils Relative % 61 %   Neutro Abs 3.4 1.7 - 7.7 K/uL   Lymphocytes Relative 24 %   Lymphs Abs 1.3 0.7 - 4.0 K/uL   Monocytes Relative 11 %   Monocytes Absolute 0.6 0.1 - 1.0 K/uL   Eosinophils Relative 2 %   Eosinophils Absolute 0.1 0.0 - 0.5 K/uL   Basophils Relative 1 %   Basophils Absolute 0.0 0.0 - 0.1 K/uL   Immature Granulocytes 1 %   Abs Immature Granulocytes 0.04 0.00 - 0.07 K/uL    Comment: Performed at [facility], [place] [address], [place], [place] [address]  Type and screen [place]     Status: None   Collection Time: 09/08/20  8:06 AM  Result Value Ref Range   ABO/RH(D) A POS    Antibody Screen NEG    Sample Expiration      09/11/2020,2359 Performed at [facility], [place] [address], [place], [place] [address]   Comprehensive metabolic panel     Status: Abnormal   Collection Time: 09/08/20  8:06 AM  Result Value Ref Range   Sodium 138 135 - 145 mmol/L   Potassium 3.9 3.5 - 5.1 mmol/L   Chloride 104 98 - 111 mmol/L   CO2 20 (L) 22 - 32 mmol/L   Glucose, Bld 109 (H) 70 - 99 mg/dL    Comment: Glucose reference range applies only to samples taken after fasting for at least 8 hours.   BUN 17 6 - 20 mg/dL   Creatinine, Ser 0.69 0.44 - 1.00 mg/dL   Calcium 8.9 8.9 - 10.3 mg/dL   Total Protein 7.6 6.5 - 8.1 g/dL   Albumin 4.0 3.5 - 5.0 g/dL   AST 28 15 - 41 U/L   ALT 18 0 - 44 U/L   Alkaline Phosphatase 44 38 - 126 U/L   Total Bilirubin 0.5 0.3 - 1.2 mg/dL   GFR, Estimated >60 >60 mL/min    Comment: (NOTE) Calculated using the CKD-EPI Creatinine Equation (2021)    Anion gap 14 5 - 15    Comment: Performed at [facility], [place] [address] [place], [place] [address]     RADIOLOGY  RESULTS: No results found.  Problem List: Patient Active Problem List   Diagnosis Date Noted  . Mediastinal adenopathy 08/27/2016  . Anxiety 08/18/2016  . GERD (gastroesophageal reflux disease) 08/18/2016  . Hypocalcemia 08/18/2016  . Postoperative hypothyroidism 08/18/2016  . Lytic bone lesion of hip 08/08/2016  . Pulmonary nodules 08/08/2016  .  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 08/04/2016  . Hematuria 07/29/2016  . Nephrolithiasis 07/19/2016  . Other microscopic hematuria 06/22/2016  . 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 Hassell Done, MD, [facility] [phone] beeper [phone]  09/08/2020 9:21 AM

## 2020-09-08 NOTE — Transfer of Care (Signed)
Immediate Anesthesia Transfer of Care Note  Patient: Tammy Wilson  Procedure(s) Performed: LAPAROSCOPIC ROUX-EN-Y GASTRIC BYPASS WITH UPPER ENDOSCOPY, CONVERSION FROM LAPAROSCOPIC SLEEVE GASTECTOMY (N/A ) UPPER GI ENDOSCOPY (N/A ) HERNIA REPAIR HIATAL (N/A )  Patient Location: PACU  Anesthesia Type:General  Level of Consciousness: awake, alert , oriented and patient cooperative  Airway & Oxygen Therapy: Patient Spontanous Breathing and Patient connected to face mask oxygen  Post-op Assessment: Report given to RN and Post -op Vital signs reviewed and stable  Post vital signs: Reviewed and stable  Last Vitals:  Vitals Value Taken Time  BP 138/84 09/08/20 1300  Temp    Pulse 82 09/08/20 1302  Resp 15 09/08/20 1302  SpO2 100 % 09/08/20 1302  Vitals shown include unvalidated device data.  Last Pain:  Vitals:   09/08/20 0821  TempSrc:   PainSc: 0-No pain         Complications: No complications documented.

## 2020-09-08 NOTE — Interval H&P Note (Signed)
History and Physical Interval Note:  09/08/2020 9:30 AM  Tammy Wilson  has presented today for surgery, with the diagnosis of MORBID OBESITY.  The various methods of treatment have been discussed with the patient and family. After consideration of risks, benefits and other options for treatment, the patient has consented to  Procedure(s) with comments: LAPAROSCOPIC ROUX-EN-Y GASTRIC BYPASS WITH UPPER ENDOSCOPY, CONVERSION FROM LAPAROSCOPIC SLEEVE GASTECTOMY (N/A) - 3.5 HOURS TOTAL PLEASE UPPER GI ENDOSCOPY (N/A) HERNIA REPAIR HIATAL (N/A) as a surgical intervention.  The patient's history has been reviewed, patient examined, no change in status, stable for surgery.  I have reviewed the patient's chart and labs.  Questions were answered to the patient's satisfaction.     Pedro Earls

## 2020-09-08 NOTE — Anesthesia Procedure Notes (Signed)
Procedure Name: Intubation Performed by: Loc Feinstein H, CRNA Pre-anesthesia Checklist: Patient identified, Emergency Drugs available, Suction available and Patient being monitored Patient Re-evaluated:Patient Re-evaluated prior to induction Oxygen Delivery Method: Circle System Utilized Preoxygenation: Pre-oxygenation with 100% oxygen Induction Type: IV induction Ventilation: Mask ventilation without difficulty Laryngoscope Size: Miller and 2 Grade View: Grade I Tube type: Oral Tube size: 7.5 mm Number of attempts: 1 Airway Equipment and Method: Stylet and Oral airway Placement Confirmation: ETT inserted through vocal cords under direct vision,  positive ETCO2 and breath sounds checked- equal and bilateral Secured at: 23 cm Tube secured with: Tape Dental Injury: Teeth and Oropharynx as per pre-operative assessment        

## 2020-09-08 NOTE — Anesthesia Preprocedure Evaluation (Addendum)
Anesthesia Evaluation  Patient identified by MRN, date of birth, ID band Patient awake    Reviewed: Allergy & Precautions, NPO status , Patient's Chart, lab work & pertinent test results  History of Anesthesia Complications (+) PONV and history of anesthetic complications  Airway Mallampati: II  TM Distance: >3 FB Neck ROM: Full    Dental no notable dental hx. (+) Dental Advisory Given   Pulmonary neg pulmonary ROS,    Pulmonary exam normal        Cardiovascular negative cardio ROS Normal cardiovascular exam     Neuro/Psych PSYCHIATRIC DISORDERS Anxiety negative neurological ROS     GI/Hepatic Neg liver ROS, GERD  Medicated,  Endo/Other  Hypothyroidism   Renal/GU negative Renal ROS  negative genitourinary   Musculoskeletal negative musculoskeletal ROS (+)   Abdominal   Peds negative pediatric ROS (+)  Hematology negative hematology ROS (+)   Anesthesia Other Findings   Reproductive/Obstetrics negative OB ROS                            Lab Results  Component Value Date   WBC 5.5 08/28/2020   HGB 13.9 08/28/2020   HCT 44.1 08/28/2020   MCV 90.7 08/28/2020   PLT 322 08/28/2020   Lab Results  Component Value Date   CREATININE 0.59 06/09/2015   BUN 18 06/05/2015   NA 140 06/05/2015   K 4.9 06/05/2015   CL 102 06/05/2015   CO2 28 06/05/2015   No results found for: INR, PROTIME  EKG: normal sinus rhythm, prolonged QT interval.   Anesthesia Physical  Anesthesia Plan  ASA: III  Anesthesia Plan: General   Post-op Pain Management:    Induction: Intravenous, Rapid sequence and Cricoid pressure planned  PONV Risk Score and Plan: 4 or greater and Ondansetron, Dexamethasone, Midazolam and Scopolamine patch - Pre-op  Airway Management Planned: Oral ETT  Additional Equipment:   Intra-op Plan:   Post-operative Plan: Extubation in OR  Informed Consent: I have reviewed the  patients History and Physical, chart, labs and discussed the procedure including the risks, benefits and alternatives for the proposed anesthesia with the patient or authorized representative who has indicated his/her understanding and acceptance.     Dental advisory given  Plan Discussed with: Anesthesiologist and CRNA  Anesthesia Plan Comments:        Anesthesia Quick Evaluation

## 2020-09-08 NOTE — Anesthesia Postprocedure Evaluation (Signed)
Anesthesia Post Note  Patient: ADRIEANA FENNELLY  Procedure(s) Performed: LAPAROSCOPIC ROUX-EN-Y GASTRIC BYPASS WITH UPPER ENDOSCOPY, CONVERSION FROM LAPAROSCOPIC SLEEVE GASTECTOMY (N/A ) UPPER GI ENDOSCOPY (N/A ) HERNIA REPAIR HIATAL (N/A )     Patient location during evaluation: PACU Anesthesia Type: General Level of consciousness: sedated Pain management: pain level controlled Vital Signs Assessment: post-procedure vital signs reviewed and stable Respiratory status: spontaneous breathing and respiratory function stable Cardiovascular status: stable Postop Assessment: no apparent nausea or vomiting Anesthetic complications: no   No complications documented.  Last Vitals:  Vitals:   09/08/20 1330 09/08/20 1345  BP: (!) 142/87 140/79  Pulse: 88 90  Resp: 16 15  Temp:    SpO2: 99% 97%    Last Pain:  Vitals:   09/08/20 1345  TempSrc:   PainSc: Asleep                 Toniya Rozar DANIEL

## 2020-09-09 ENCOUNTER — Other Ambulatory Visit (HOSPITAL_COMMUNITY): Payer: Self-pay | Admitting: Surgery

## 2020-09-09 ENCOUNTER — Encounter (HOSPITAL_COMMUNITY): Payer: Self-pay | Admitting: Surgery

## 2020-09-09 LAB — CBC WITH DIFFERENTIAL/PLATELET
Abs Immature Granulocytes: 0.05 10*3/uL (ref 0.00–0.07)
Basophils Absolute: 0 10*3/uL (ref 0.0–0.1)
Basophils Relative: 0 %
Eosinophils Absolute: 0 10*3/uL (ref 0.0–0.5)
Eosinophils Relative: 0 %
HCT: 38 % (ref 36.0–46.0)
Hemoglobin: 11.9 g/dL — ABNORMAL LOW (ref 12.0–15.0)
Immature Granulocytes: 1 %
Lymphocytes Relative: 12 %
Lymphs Abs: 1 10*3/uL (ref 0.7–4.0)
MCH: 28.6 pg (ref 26.0–34.0)
MCHC: 31.3 g/dL (ref 30.0–36.0)
MCV: 91.3 fL (ref 80.0–100.0)
Monocytes Absolute: 0.9 10*3/uL (ref 0.1–1.0)
Monocytes Relative: 11 %
Neutro Abs: 6.2 10*3/uL (ref 1.7–7.7)
Neutrophils Relative %: 76 %
Platelets: 277 10*3/uL (ref 150–400)
RBC: 4.16 MIL/uL (ref 3.87–5.11)
RDW: 13.3 % (ref 11.5–15.5)
WBC: 8.1 10*3/uL (ref 4.0–10.5)
nRBC: 0 % (ref 0.0–0.2)

## 2020-09-09 LAB — BASIC METABOLIC PANEL
Anion gap: 11 (ref 5–15)
BUN: 11 mg/dL (ref 6–20)
CO2: 25 mmol/L (ref 22–32)
Calcium: 8.6 mg/dL — ABNORMAL LOW (ref 8.9–10.3)
Chloride: 103 mmol/L (ref 98–111)
Creatinine, Ser: 0.71 mg/dL (ref 0.44–1.00)
GFR, Estimated: >60 mL/min (ref >60)
Glucose, Bld: 124 mg/dL — ABNORMAL HIGH (ref 70–99)
Potassium: 4 mmol/L (ref 3.5–5.1)
Sodium: 139 mmol/L (ref 135–145)

## 2020-09-09 LAB — MAGNESIUM: Magnesium: 2.1 mg/dL (ref 1.7–2.4)

## 2020-09-09 MED ORDER — ONDANSETRON 4 MG PO TBDP: 4.0000 mg | ORAL_TABLET | Freq: Four times a day (QID) | ORAL | 0 refills | Status: DC | PRN

## 2020-09-09 MED ORDER — DOCUSATE SODIUM 100 MG PO CAPS: 100.0000 mg | ORAL_CAPSULE | Freq: Two times a day (BID) | ORAL | 0 refills | Status: AC

## 2020-09-09 MED ORDER — GABAPENTIN 100 MG PO CAPS: 200.0000 mg | ORAL_CAPSULE | Freq: Two times a day (BID) | ORAL | 0 refills | Status: DC

## 2020-09-09 MED ORDER — GABAPENTIN 100 MG PO CAPS
200.0000 mg | ORAL_CAPSULE | Freq: Two times a day (BID) | ORAL | Status: DC
  Administered 2020-09-09 – 2020-09-10 (×3): 200 mg via ORAL
  Filled 2020-09-09 (×3): qty 2

## 2020-09-09 MED ORDER — LEVOTHYROXINE SODIUM 112 MCG PO TABS
112.0000 ug | ORAL_TABLET | Freq: Every day | ORAL | Status: DC
  Filled 2020-09-09: qty 1

## 2020-09-09 MED ORDER — ACETAMINOPHEN 500 MG PO TABS: 1000.0000 mg | ORAL_TABLET | Freq: Three times a day (TID) | ORAL | 0 refills | Status: AC

## 2020-09-09 MED ORDER — PANTOPRAZOLE SODIUM 40 MG PO TBEC: 40.0000 mg | DELAYED_RELEASE_TABLET | Freq: Every day | ORAL | 0 refills | Status: DC

## 2020-09-09 MED ORDER — TRAMADOL HCL 50 MG PO TABS: 50.0000 mg | ORAL_TABLET | Freq: Four times a day (QID) | ORAL | Status: DC | PRN

## 2020-09-09 MED ORDER — VENLAFAXINE HCL ER 150 MG PO CP24
150.0000 mg | ORAL_CAPSULE | Freq: Every day | ORAL | Status: DC
  Administered 2020-09-09 – 2020-09-10 (×2): 150 mg via ORAL
  Filled 2020-09-09 (×2): qty 1

## 2020-09-09 MED ORDER — OXYCODONE HCL 5 MG PO TABS: 5.0000 mg | ORAL_TABLET | Freq: Four times a day (QID) | ORAL | 0 refills | Status: DC | PRN

## 2020-09-09 MED ORDER — PANTOPRAZOLE SODIUM 40 MG PO TBEC: 40.0000 mg | DELAYED_RELEASE_TABLET | Freq: Every day | ORAL | Status: DC

## 2020-09-09 MED ORDER — METHOCARBAMOL 1000 MG/10ML IJ SOLN
500.0000 mg | Freq: Four times a day (QID) | INTRAVENOUS | Status: DC | PRN
  Filled 2020-09-09: qty 5

## 2020-09-09 MED ORDER — BUPROPION HCL ER (XL) 300 MG PO TB24
300.0000 mg | ORAL_TABLET | Freq: Every day | ORAL | Status: DC
  Administered 2020-09-09 – 2020-09-10 (×2): 300 mg via ORAL
  Filled 2020-09-09 (×2): qty 1

## 2020-09-09 MED ORDER — LIOTHYRONINE SODIUM 5 MCG PO TABS
5.0000 ug | ORAL_TABLET | Freq: Every day | ORAL | Status: DC
  Administered 2020-09-09 – 2020-09-10 (×2): 5 ug via ORAL
  Filled 2020-09-09 (×2): qty 1

## 2020-09-09 MED ORDER — LORAZEPAM 1 MG PO TABS: 1.0000 mg | ORAL_TABLET | Freq: Three times a day (TID) | ORAL | Status: DC | PRN

## 2020-09-09 NOTE — Progress Notes (Signed)
Nutrition Education Note  Received consult for diet education for patient s/p bariatric surgery.  Attempted to speak with pt by phone x 3. No answer. Bariatric nurse coordinator has seen pt and reviewed liquid diet. If pt remains here, will see tomorrow.   If other nutrition issues arise, please consult RD.  Clayton Bibles, MS, RD, LDN Inpatient Clinical Dietitian Contact information available via Amion

## 2020-09-09 NOTE — Discharge Instructions (Signed)
GASTRIC BYPASS / SLEEVE  Home Care Instructions  These instructions are to help you care for yourself when you go home.  Call: If you have any problems. . Call [phone] and ask for the surgeon on call . If you have an emergency related to your surgery please use the ER at .  . Tell the ER staff that you are a new post-op gastric bypass or gastric sleeve patient   Signs and symptoms to report: . Severe vomiting or nausea o If you cannot handle clear liquids for longer than 1 day, call your surgeon  . Abdominal pain which does not get better after taking your pain medication . Fever greater than 100.4 F and chills . Heart rate over 100 beats a minute . Trouble breathing . Chest pain .  Redness, swelling, drainage, or foul odor at incision (surgical) sites .  If your incisions open or pull apart . Swelling or pain in calf (lower leg) . Diarrhea (Loose bowel movements that happen often), frequent watery, uncontrolled bowel movements . Constipation, (no bowel movements for 3 days) if this happens:  o Take Milk of Magnesia, 2 tablespoons by mouth, 3 times a day for 2 days if needed o Stop taking Milk of Magnesia once you have had a bowel movement o Call your doctor if constipation continues Or o Take Miralax  (instead of Milk of Magnesia) following the label instructions o Stop taking Miralax once you have had a bowel movement o Call your doctor if constipation continues . Anything you think is "abnormal for you"   Normal side effects after surgery: . Unable to sleep at night or unable to concentrate . Irritability . Being tearful (crying) or depressed These are common complaints, possibly related to your anesthesia, stress of surgery and change in lifestyle, that usually go away a few weeks after surgery.  If these feelings continue, call your medical doctor.  Wound Care: You may have surgical glue, steri-strips, or staples over your incisions after surgery . Surgical  glue:  Looks like a clear film over your incisions and will wear off a little at a time . Steri-strips : Adhesive strips of tape over your incisions. You may notice a yellowish color on the skin under the steri-strips. This is used to make the   steri-strips stick better. Do not pull the steri-strips off - let them fall off . Staples: Staples may be removed before you leave the hospital o If you go home with staples, call [facility] at for an appointment with your surgeon's nurse to have staples removed 10 days after surgery, [phone] . Showering: You may shower two (2) days after your surgery unless your surgeon tells you differently o Wash gently around incisions with warm soapy water, rinse well, and gently pat dry  o If you have a drain (tube from your incision), you may need someone to hold this while you shower  o No tub baths until staples are removed and incisions are healed     Medications: . Medications should be liquid or crushed if larger than the size of a dime . Extended release pills (medication that releases a little bit at a time through the day) should not be crushed . Depending on the size and number of medications you take, you may need to space (take a few throughout the day)/change the time you take your medications so that you do not over-fill your pouch (smaller stomach) . Make sure you follow-up with   your primary care physician to make medication changes needed during rapid weight loss and life-style changes . If you have diabetes, follow up with the doctor that orders your diabetes medication(s) within one week after surgery and check your blood sugar regularly. . Do not drive while taking narcotics (pain medications) . DO NOT take NSAID'S (Examples of NSAID's include ibuprofen, naproxen)  Diet:                    First 2 Weeks  You will see the nutritionist about two (2) weeks after your surgery. The nutritionist will increase the types of foods you can  eat if you are handling liquids well: . If you have severe vomiting or nausea and cannot handle clear liquids lasting longer than 1 day, call your surgeon  Protein Shake . Drink at least 2 ounces of shake 5-6 times per day . Each serving of protein shakes (usually 8 - 12 ounces) should have a minimum of:  o 15 grams of protein  o And no more than 5 grams of carbohydrate  . Goal for protein each day: o Men = 80 grams per day o Women = 60 grams per day . Protein powder may be added to fluids such as non-fat milk or Lactaid milk or Soy milk (limit to 35 grams added protein powder per serving)  Hydration . Slowly increase the amount of water and other clear liquids as tolerated (See Acceptable Fluids) . Slowly increase the amount of protein shake as tolerated  .  Sip fluids slowly and throughout the day . May use sugar substitutes in small amounts (no more than 6 - 8 packets per day; i.e. Splenda)  Fluid Goal . The first goal is to drink at least 8 ounces of protein shake/drink per day (or as directed by the nutritionist);  See handout from pre-op Bariatric Education Class for examples of protein shake/drink.   o Slowly increase the amount of protein shake you drink as tolerated o You may find it easier to slowly sip shakes throughout the day o It is important to get your proteins in first . Your fluid goal is to drink 64 - 100 ounces of fluid daily o It may take a few weeks to build up to this . 32 oz (or more) should be clear liquids  And  . 32 oz (or more) should be full liquids (see below for examples) . Liquids should not contain sugar, caffeine, or carbonation  Clear Liquids: . Water or Sugar-free flavored water (i.e. Fruit H2O, Propel) . Decaffeinated coffee or tea (sugar-free) . Notnamed Croucher Lite, Wyler's Lite, Minute Maid Lite . Sugar-free Jell-O . Bouillon or broth . Sugar-free Popsicle:   *Less than 20 calories each; Limit 1 per day  Full Liquids: Protein Shakes/Drinks + 2  choices per day of other full liquids . Full liquids must be: o No More Than 12 grams of Carbs per serving  o No More Than 3 grams of Fat per serving . Strained low-fat cream soup . Non-Fat milk . Fat-free Lactaid Milk . Sugar-free yogurt (Dannon Lite & Fit, Greek yogurt)      Vitamins and Minerals . Start 1 day after surgery unless otherwise directed by your surgeon . Bariatric Specific Complete Multivitamins . Chewable Calcium Citrate with Vitamin D-3 (Example: 3 Chewable Calcium Plus 600 with Vitamin D-3) o Take 500 mg three (3) times a day for a total of 1500 mg each day o Do not take all 3 doses   of calcium at one time as it may cause constipation, and you can only absorb 500 mg  at a time  o Do not mix multivitamins containing iron with calcium supplements; take 2 hours apart  . Menstruating women and those at risk for anemia (a blood disease that causes weakness) may need extra iron o Talk with your doctor to see if you need more iron . If you need extra iron: Total daily Iron recommendation (including Vitamins) is 50 to 100 mg Iron/day . Do not stop taking or change any vitamins or minerals until you talk to your nutritionist or surgeon . Your nutritionist and/or surgeon must approve all vitamin and mineral supplements   Activity and Exercise: It is important to continue walking at home.  Limit your physical activity as instructed by your doctor.  During this time, use these guidelines: . Do not lift anything greater than ten (10) pounds for at least two (2) weeks . Do not go back to work or drive until your surgeon says you can . You may have sex when you feel comfortable  o It is VERY important for female patients to use a reliable birth control method; fertility often increases after surgery  o Do not get pregnant for at least 18 months . Start exercising as soon as your doctor tells you that you can o Make sure your doctor approves any physical activity . Start with a  simple walking program . Walk 5-15 minutes each day, 7 days per week.  . Slowly increase until you are walking 30-45 minutes per day Consider joining our BELT program. [phone] or email [email or web address]   Special Instructions Things to remember:  . Use your CPAP when sleeping if this applies to you, do not stop the use of CPAP unless directed by physician after a sleep study . [facility] has a free Bariatric Surgery Support Group that meets monthly, the 3rd Thursday, 6 pm.  Please review discharge information for date and location of this meeting. . It is very important to keep all follow up appointments with your surgeon, nutritionist, primary care physician, and behavioral health practitioner o After the first year, please follow up with your bariatric surgeon and nutritionist at least once a year in order to maintain best weight loss results   [facility]: [phone] [facility]: [phone] Bariatric Nurse Coordinator: [phone]      

## 2020-09-09 NOTE — Progress Notes (Addendum)
Patient alert and oriented, pain is controlled. Patient is tolerating fluids, advanced to protein shake today, patient is tolerating well. Reviewed Gastric Bypass discharge instructions with patient and patient is able to articulate understanding. Reinforced details of Phase 2 diet with pt, only drinking full and clear liquids for the next two weeks. Provided information on BELT program, Support Group and [facility] outpatient pharmacy. All questions answered, will continue to monitor.

## 2020-09-09 NOTE — Progress Notes (Signed)
Patient alert and oriented, Post op day 1.  Provided support and encouragement.  Encouraged pulmonary toilet, ambulation and small sips of liquids.  All questions answered.  Will continue to monitor. 

## 2020-09-09 NOTE — Progress Notes (Addendum)
S: No acute events. More sore this morning. Tolerating liquids/proteins without nausea or emesis. Did some walking and using IS.   O: Vitals, labs, intake/output, and orders reviewed at this time. Tmax 98.3, HR 101-102 overnight but 74 this morning. Normo- to hypertensive. Sat 98% RA. PO 310. UOP [id number]. WBC 8.1 (5.5 preop), Hgb 11.9 (13.4 preop), plts 277.    Gen: A&Ox3, no distress  H&N: EOMI, atraumatic, neck supple Chest: unlabored respirations, RRR Abd: soft, nontender, nondistended, incision(s) c/d/i with dermabond, no cellulitis or hematoma Ext: warm, no edema Neuro: grossly normal  Lines/tubes/drains: PIV  A/P: POD 1 laparoscopic RYGB conversion from sleeve -Continue liquids and protein shakes -Continue ambulation, SCDs, SQH -Continue pulmonary toilet -Patient inquiring about [employer]. I recommend against any herbal supplements or diet products beyond the standard low-sugar protein shakes at this point. RD consult pending.  -Start home meds, multimodal pain regimen  Plan discharge this afternoon or tomorrow depending on clinical progress.    Romana Juniper, MD [facility], [place]

## 2020-09-09 NOTE — Progress Notes (Addendum)
Water completed 0230. Protein started 0630.

## 2020-09-10 LAB — CBC WITH DIFFERENTIAL/PLATELET
Abs Immature Granulocytes: 0.03 10*3/uL (ref 0.00–0.07)
Basophils Absolute: 0 10*3/uL (ref 0.0–0.1)
Basophils Relative: 1 %
Eosinophils Absolute: 0.1 10*3/uL (ref 0.0–0.5)
Eosinophils Relative: 1 %
HCT: 41.5 % (ref 36.0–46.0)
Hemoglobin: 12.6 g/dL (ref 12.0–15.0)
Immature Granulocytes: 1 %
Lymphocytes Relative: 24 %
Lymphs Abs: 1.6 10*3/uL (ref 0.7–4.0)
MCH: 28.1 pg (ref 26.0–34.0)
MCHC: 30.4 g/dL (ref 30.0–36.0)
MCV: 92.6 fL (ref 80.0–100.0)
Monocytes Absolute: 0.7 10*3/uL (ref 0.1–1.0)
Monocytes Relative: 10 %
Neutro Abs: 4.2 10*3/uL (ref 1.7–7.7)
Neutrophils Relative %: 63 %
Platelets: 263 10*3/uL (ref 150–400)
RBC: 4.48 MIL/uL (ref 3.87–5.11)
RDW: 13.6 % (ref 11.5–15.5)
WBC: 6.6 10*3/uL (ref 4.0–10.5)
nRBC: 0 % (ref 0.0–0.2)

## 2020-09-10 NOTE — Progress Notes (Addendum)
Patient alert and oriented, Post op day 2.  Provided support and encouragement.  Encouraged pulmonary toilet, ambulation and small sips of liquids.  All questions answered.  Will continue to monitor.   Pt to be discharged today.  24hr fluid recall: 942mL.  Per dehydration protocol, will call pt to f/u within one week post op.

## 2020-09-10 NOTE — Discharge Summary (Signed)
Physician Discharge Summary  Tammy Wilson IOE:[id number] DOB: [birth year] DOA: 09/08/2020  PCP: Ernestene Kiel, MD  Admit date: 09/08/2020 Discharge date: 09/10/20  Recommendations for Outpatient Follow-up:     Follow-up Information    [facility] [place]. Go on 10/01/2020.   Specialty: General Surgery Why: at 2:45pm for [name].  Please arrive 15 minutes prior to your appointment time.  Thank you. Contact information: [address] [place] [address] [phone]        Carlena Hurl, PA-C. Go on 10/28/2020.   Specialty: General Surgery Why: at 2:30pm for [name].  Please arrive 15 minutes prior to your appointment time. Thank you. Contact information: [place]  [address] [phone]              Discharge Diagnoses:  Active Problems:   Gastric bypass status for obesity   S/P gastric bypass   Surgical Procedure: Laparoscopic conversion from sleeve to Roux-en-Y gastric bypass, upper endoscopy  Discharge Condition: Good Disposition: Home  Diet recommendation: Postoperative gastric bypass diet  Filed Weights   09/08/20 0809  Weight: 103.9 kg     Hospital Course:  The patient was admitted for a planned laparoscopic Roux-en-Y gastric bypass. Please see operative note. Preoperatively the patient was given 5000 units of subcutaneous heparin for DVT prophylaxis. ERAS protocol was used. Postoperative prophylactic heparin dosing was started on the evening of postoperative day 0.  The patient was started on ice chips and water on the evening of POD 0 which they tolerated. On postoperative day 1 The patient's diet was advanced to protein shakes which they also tolerated. On POD 2, The patient was ambulating without difficulty. Their vital signs are stable without fever or tachycardia. Their hemoglobin had remained stable. The patient had received discharge instructions and counseling. They were deemed stable for  discharge.  BP 125/72 (BP Location: Left Arm)   Pulse 70   Temp 97.7 F (36.5 C) (Oral)   Resp 16   Wt 103.9 kg   SpO2 99%   BMI 35.87 kg/m   Gen: alert, NAD, non-toxic appearing Pupils: equal, no scleral icterus Pulm: Lungs clear to auscultation, symmetric chest rise CV: regular rate and rhythm Abd: soft, min tender, nondistended. No cellulitis. No incisional hernia Ext: no edema, no calf tenderness Skin: no rash, no jaundice  Discharge Instructions   Allergies as of 09/10/2020      Reactions   Dilaudid [hydromorphone Hcl] Palpitations   Sulfamethoxazole-trimethoprim Rash   Nausea and vomitting      Medication List    TAKE these medications   acetaminophen 500 MG tablet Commonly known as: TYLENOL Take 2 tablets (1,000 mg total) by mouth every 8 (eight) hours for 5 days. What changed:   how much to take  when to take this  reasons to take this   buPROPion 300 MG 24 hr tablet Commonly known as: WELLBUTRIN XL Take 300 mg by mouth daily.   docusate sodium 100 MG capsule Commonly known as: Colace Take 1 capsule (100 mg total) by mouth 2 (two) times daily. Okay to decrease to once daily or stop taking if having loose bowel movements   gabapentin 100 MG capsule Commonly known as: NEURONTIN Take 2 capsules (200 mg total) by mouth every 12 (twelve) hours.   levothyroxine 112 MCG tablet Commonly known as: SYNTHROID Take 112 mcg by mouth every evening.   liothyronine 5 MCG tablet Commonly known as: CYTOMEL Take 5 mcg by mouth  daily before breakfast.   LORazepam 1 MG tablet Commonly known as: ATIVAN Take 1 mg by mouth 3 (three) times daily as needed for anxiety.   ondansetron 4 MG disintegrating tablet Commonly known as: ZOFRAN-ODT Take 1 tablet (4 mg total) by mouth every 6 (six) hours as needed for nausea or vomiting.   oxyCODONE 5 MG immediate release tablet Commonly known as: Oxy IR/ROXICODONE Take 1 tablet (5 mg total) by mouth every 6 (six) hours as  needed for severe pain.   pantoprazole 40 MG tablet Commonly known as: PROTONIX Take 1 tablet (40 mg total) by mouth daily.   venlafaxine XR 150 MG 24 hr capsule Commonly known as: EFFEXOR-XR Take 150 mg by mouth daily. with food       Follow-up Information    [facility], [place]. Go on 10/01/2020.   Specialty: General Surgery Why: at 2:45pm for [name].  Please arrive 15 minutes prior to your appointment time.  Thank you. Contact information: [address] [place] [address] [phone]        Carlena Hurl, PA-C. Go on 10/28/2020.   Specialty: General Surgery Why: at 2:30pm for [name].  Please arrive 15 minutes prior to your appointment time. Thank you. Contact information: [address] [place] [address] [phone]                The results of significant diagnostics from this hospitalization (including imaging, microbiology, ancillary and laboratory) are listed below for reference.    Significant Diagnostic Studies: No results found.  Labs: Basic Metabolic Panel: Recent Labs  Lab 09/08/20 0806 09/09/20 0516  NA 138 139  K 3.9 4.0  CL 104 103  CO2 20* 25  GLUCOSE 109* 124*  BUN 17 11  CREATININE 0.69 0.71  CALCIUM 8.9 8.6*  MG  --  2.1   Liver Function Tests: Recent Labs  Lab 09/08/20 0806  AST 28  ALT 18  ALKPHOS 44  BILITOT 0.5  PROT 7.6  ALBUMIN 4.0    CBC: Recent Labs  Lab 09/08/20 0806 09/08/20 1544 09/09/20 0516 09/10/20 0446  WBC 5.5  --  8.1 6.6  NEUTROABS 3.4  --  6.2 4.2  HGB 13.4 12.8 11.9* 12.6  HCT 41.7 40.0 38.0 41.5  MCV 89.3  --  91.3 92.6  PLT 297  --  277 263    CBG: No results for input(s): GLUCAP in the last 168 hours.  Active Problems:   Gastric bypass status for obesity   S/P gastric bypass  Signed:  [facility], [place] 09/10/2020, 8:22 AM

## 2020-09-10 NOTE — Progress Notes (Signed)
Nutrition Education Note ° °Received consult for diet education for patient s/p bariatric surgery. ° °Discussed 2 week post op diet with pt. Emphasized that liquids must be non carbonated, non caffeinated, and sugar free. Fluid goals discussed. Pt to follow up with outpatient bariatric RD for further diet progression after 2 weeks. Multivitamins and minerals also reviewed. Teach back method used, pt expressed understanding, expect good compliance. ° °If nutrition issues arise, please consult RD. ° °Advait Buice, MS, RD, LDN °Inpatient Clinical Dietitian °Contact information available via Amion ° ° °

## 2020-09-10 NOTE — Progress Notes (Signed)
Patient was given discharge instructions, and all questions were answered.  Patient was stable for discharge and was taken to the main exit by wheelchair. 

## 2020-09-16 ENCOUNTER — Telehealth (HOSPITAL_COMMUNITY): Payer: Self-pay | Admitting: *Deleted

## 2020-09-16 NOTE — Telephone Encounter (Signed)
1.  Tell me about your pain and pain management? Pt denies any pain.   2.  Let's talk about fluid intake.  How much total fluid are you taking in? Pt states that she is getting in at least 90-100oz of fluid including protein shakes, bottled water with Tammy Wilson light, and greek yogurt.     3.  How much protein have you taken in the last 2 days? Pt states she is meeting her goal of 60g of protein each day with the protein shakes.  Pt instructed not to consume any solid foods until nutrition meeting with [name] next Tuesday 3/29.   4.  Have you had nausea?  Tell me about when have experienced nausea and what you did to help? Pt denies nausea.   5.  Has the frequency or color changed with your urine? Pt states that she is urinating "fine" with no changes in frequency or urgency.     6.  Tell me what your incisions look like? "Incisions look fine". Pt denies a fever, chills.  Pt states incisions are not swollen, open, or draining.  Pt encouraged to call [facility] if incisions change.   7.  Have you been passing gas? BM? Pt states that she is not able to have a BM since before surgery.  Pt states that she has taken stool softeners, a suppository, and completed an enema on 09/14/20 with minimal results.  Pt instructed to follow post-op instructions which outlined either Milk of Magnesia or Miralax and to f/u with [name] if still unable to have BM.  Pt denies abdominal pain or discomfort at this time.   8.  If a problem or question were to arise who would you call?  Do you know contact numbers for [place], [facility], and [facility]? Pt denies dehydration symptoms.  Pt can describe s/sx of dehydration.  Pt knows to call [facility] for surgical, [facility] for nutrition, and [place] for non-urgent questions or concerns.   9.  How has the walking going? Pt states she is walking around and able to be active without difficulty. Pt instructed not to lift more than 10lbs for the next week.   10.  How are your vitamins and calcium going?   How are you taking them? Pt states that she is taking her supplements and vitamins without difficulty.  Reminded patient that first 30 days post-operatively are important for successful recovery.  Practice good hand hygiene, wearing a mask when appropriate (since optional in most places), and minimizing exposure to people who live outside of the home, especially if they are exhibiting any respiratory, GI, or illness-like symptoms.

## 2020-09-23 ENCOUNTER — Encounter: Payer: BC Managed Care – PPO | Attending: Surgery | Admitting: Skilled Nursing Facility1

## 2020-09-23 ENCOUNTER — Other Ambulatory Visit: Payer: Self-pay

## 2020-09-23 DIAGNOSIS — E669 Obesity, unspecified: Secondary | ICD-10-CM | POA: Diagnosis not present

## 2020-09-24 NOTE — Progress Notes (Signed)
2 Week Post-Operative Nutrition Class   Patient was seen on 08/22/18 for Post-Operative Nutrition education at the [facility].    Surgery date: 09/08/2020 Surgery type: RYGB Start weight at [facility]: 234.2 Weight today:  219.6  Body Composition Scale 09/23/2020  Current Body Weight 219.6  Total Body Fat % 40.9  Visceral Fat 12  Fat-Free Mass % 59   Total Body Water % 44  Muscle-Mass lbs 32.7  BMI 34.1  Body Fat Displacement          Torso  lbs 55.6         Left Leg  lbs 11.1         Right Leg  lbs 11.1         Left Arm  lbs 5.5         Right Arm   lbs 5.5      The following the learning objectives were met by the patient during this course:  Identifies Phase 3 (Soft, High Proteins) Dietary Goals and will begin from 2 weeks post-operatively to 2 months post-operatively  Identifies appropriate sources of fluids and proteins   Identifies appropriate fat sources and healthy verses unhealthy fat types    States protein recommendations and appropriate sources post-operatively  Identifies the need for appropriate texture modifications, mastication, and bite sizes when consuming solids  Identifies appropriate multivitamin and calcium sources post-operatively  Describes the need for physical activity post-operatively and will follow MD recommendations  States when to call healthcare provider regarding medication questions or post-operative complications   Handouts given during class include:  Phase 3A: Soft, High Protein Diet Handout  Phase 3 High Protein Meals  Healthy Fats   Follow-Up Plan: Patient will follow-up at [facility] in 6 weeks for 2 month post-op nutrition visit for diet advancement per MD.

## 2020-09-29 ENCOUNTER — Telehealth: Payer: Self-pay | Admitting: Skilled Nursing Facility1

## 2020-09-29 NOTE — Telephone Encounter (Signed)
RD called pt to verify fluid intake once starting soft, solid proteins 2 week post-bariatric surgery.   Daily Fluid intake: not tracking, unsure Daily Protein intake: 1-2 protein shakes some solid proteins here and there  Concerns/issues:   Advised to start tracking her fluid intake to ensure she was meeting her needs and to increase her solid proteins and decrease her protein shakes

## 2020-11-05 ENCOUNTER — Ambulatory Visit: Payer: BC Managed Care – PPO | Admitting: Skilled Nursing Facility1

## 2020-11-05 DIAGNOSIS — N3000 Acute cystitis without hematuria: Secondary | ICD-10-CM | POA: Diagnosis not present

## 2020-11-05 DIAGNOSIS — R109 Unspecified abdominal pain: Secondary | ICD-10-CM | POA: Diagnosis not present

## 2020-11-05 DIAGNOSIS — R8290 Unspecified abnormal findings in urine: Secondary | ICD-10-CM | POA: Diagnosis not present

## 2021-01-06 DIAGNOSIS — F331 Major depressive disorder, recurrent, moderate: Secondary | ICD-10-CM | POA: Diagnosis not present

## 2021-01-06 DIAGNOSIS — E890 Postprocedural hypothyroidism: Secondary | ICD-10-CM | POA: Diagnosis not present

## 2021-01-06 DIAGNOSIS — F419 Anxiety disorder, unspecified: Secondary | ICD-10-CM | POA: Diagnosis not present

## 2021-01-06 DIAGNOSIS — G4700 Insomnia, unspecified: Secondary | ICD-10-CM | POA: Diagnosis not present

## 2021-01-27 DIAGNOSIS — Z6828 Body mass index (BMI) 28.0-28.9, adult: Secondary | ICD-10-CM | POA: Diagnosis not present

## 2021-01-27 DIAGNOSIS — R8290 Unspecified abnormal findings in urine: Secondary | ICD-10-CM | POA: Diagnosis not present

## 2021-02-05 IMAGING — MG DIGITAL SCREENING BILAT W/ TOMO W/ CAD
8 series · 8 of 24 positions shown · non-contrast
Comparison: Previous exam(s).

CLINICAL DATA: Screening.

EXAM:
DIGITAL SCREENING BILATERAL MAMMOGRAM WITH TOMO AND CAD

[L CC synth-2D]
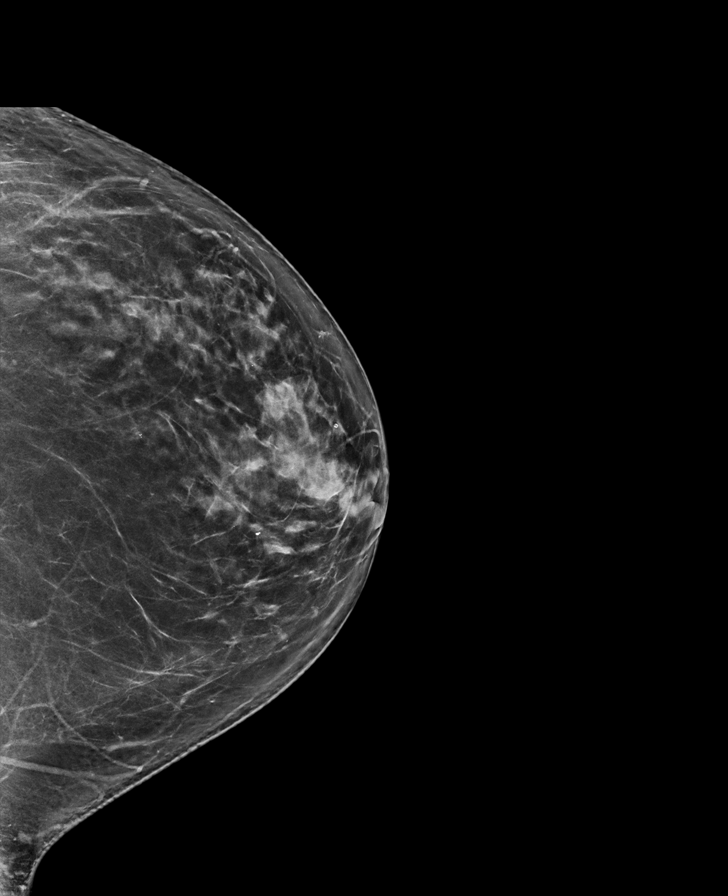

[R CC synth-2D]
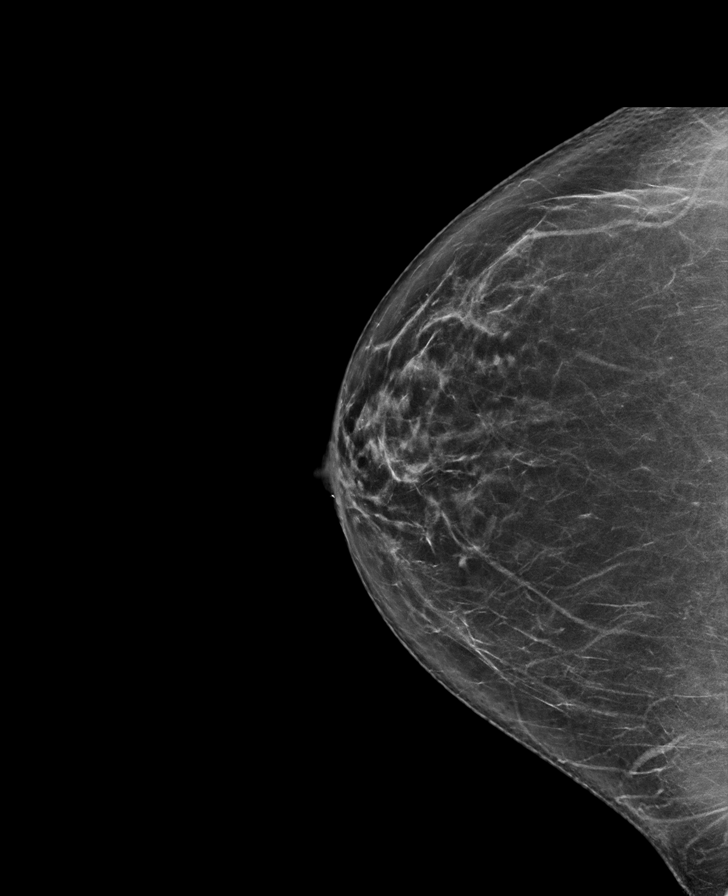

[L MLO synth-2D]
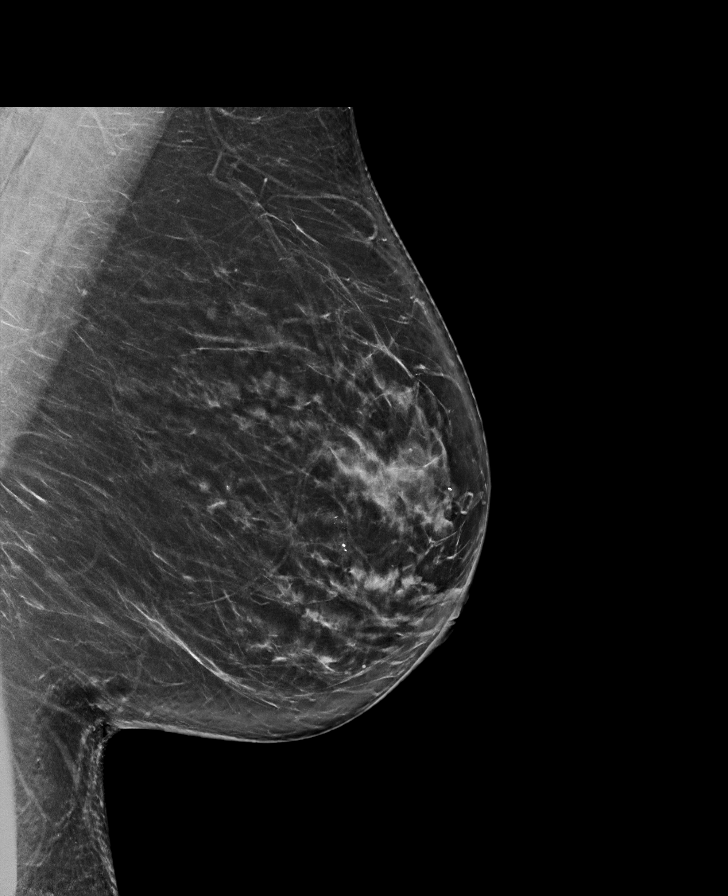

[R MLO synth-2D]
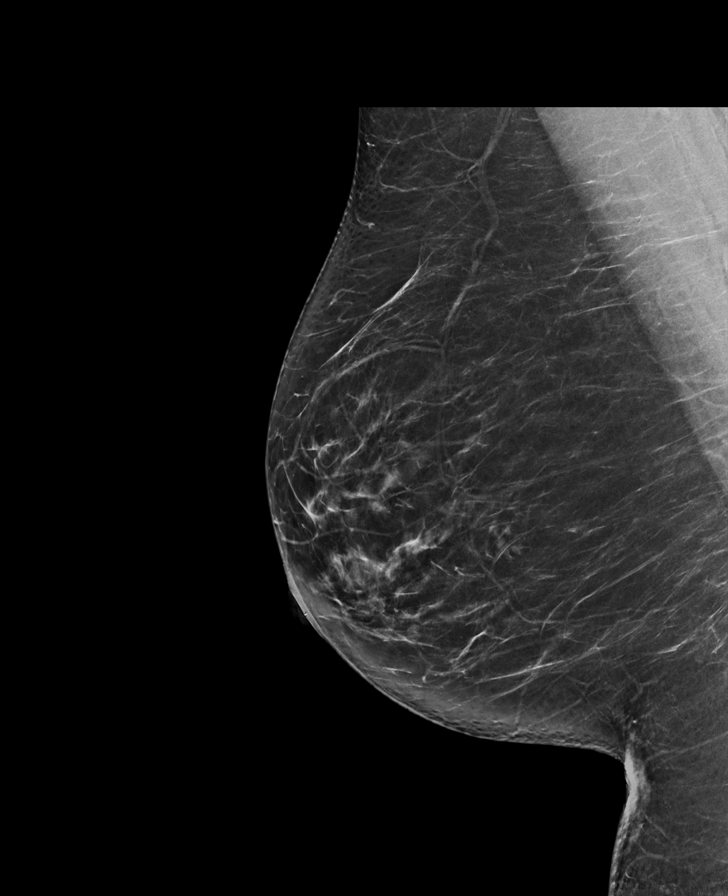

[R MLO tomo · tomo slice 41/80.0]
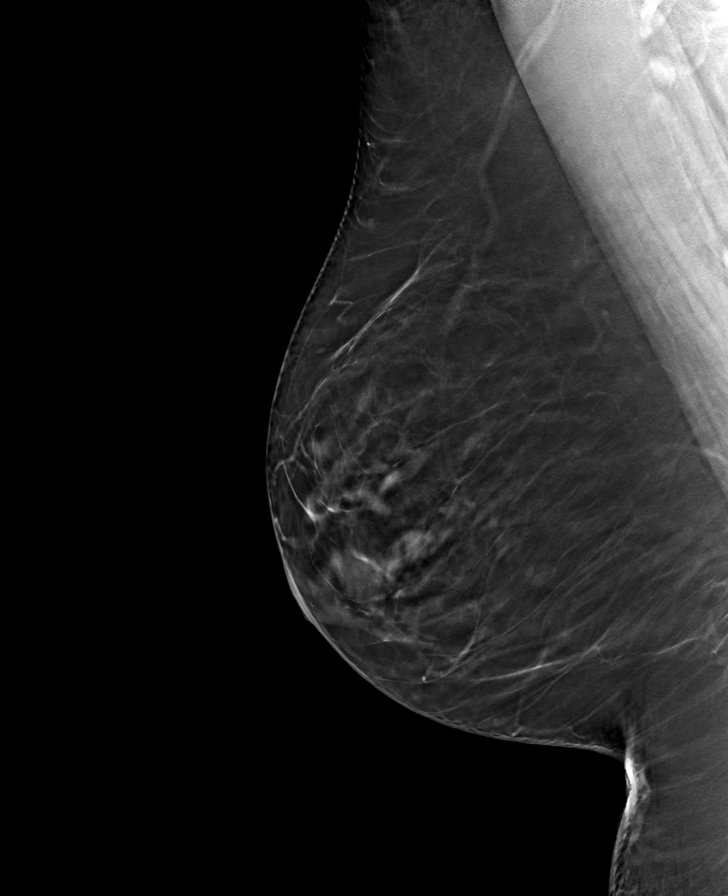

[R CC tomo · tomo slice 39/76.0]
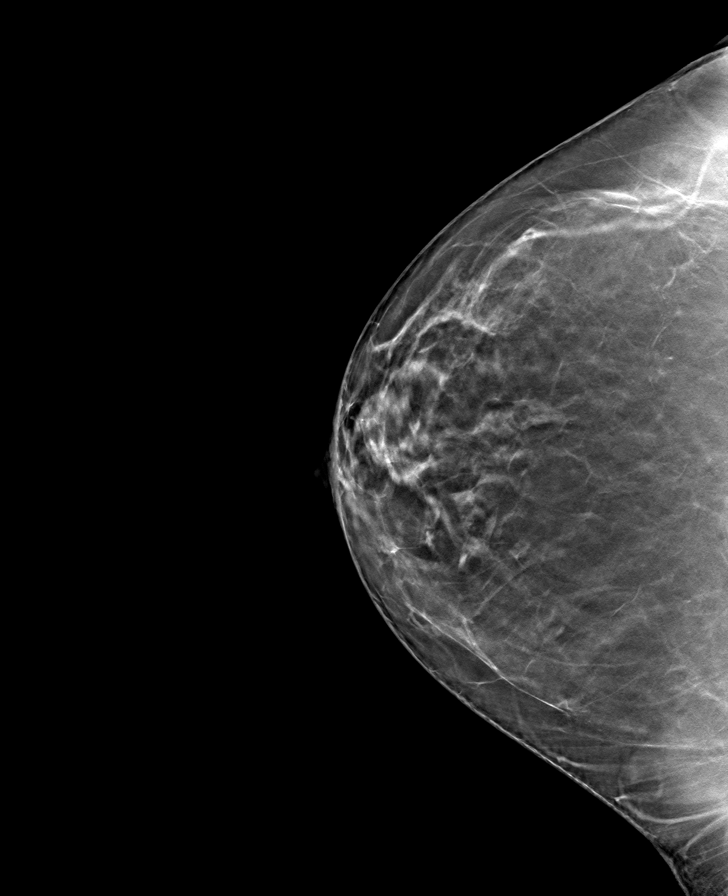

[L MLO tomo · tomo slice 42/83.0]
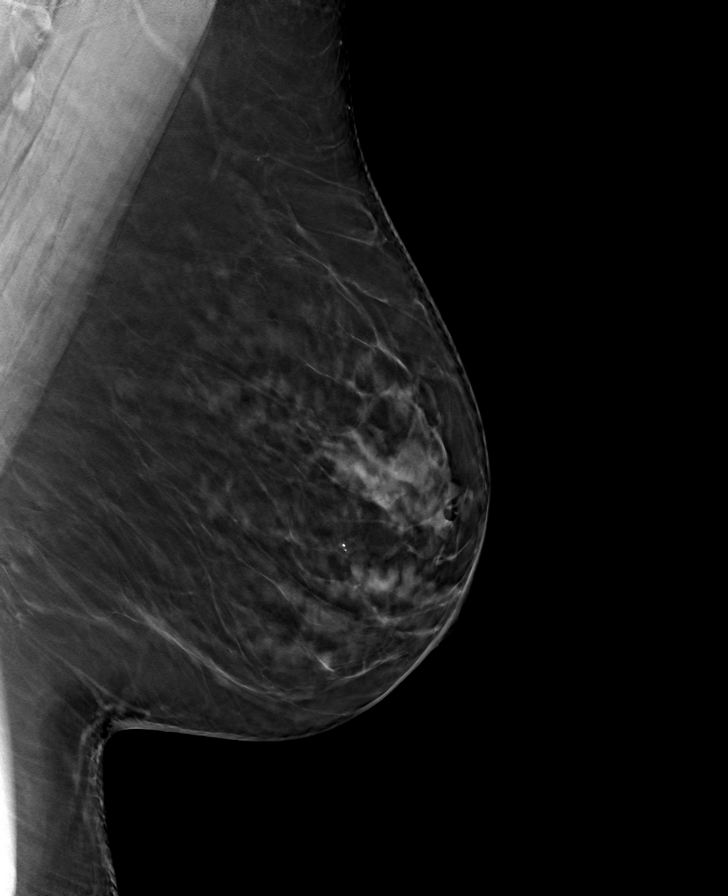

[L CC tomo · tomo slice 39/78.0]
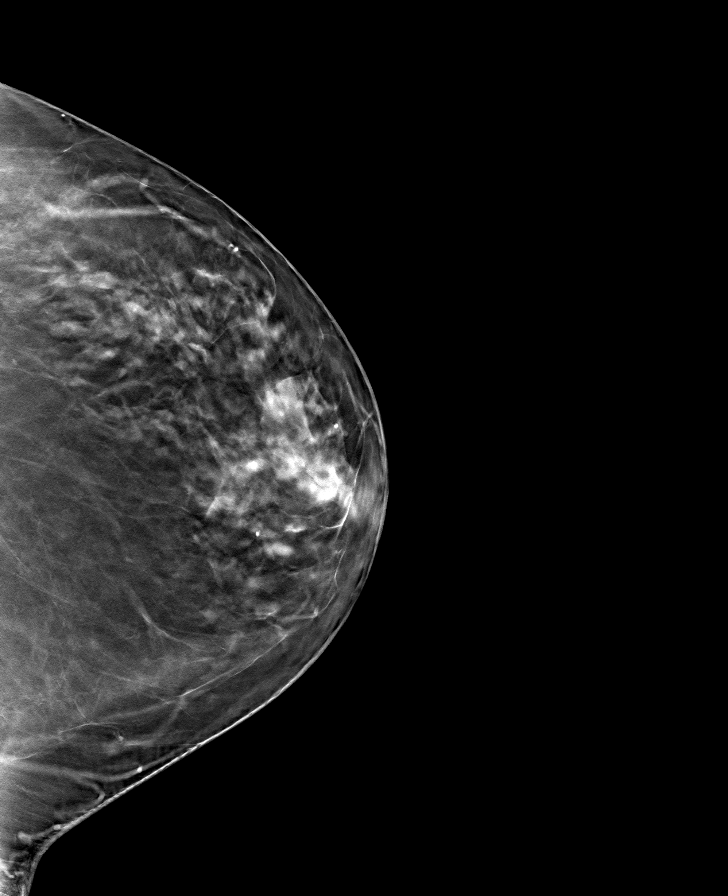

[8 of 24 positions shown; findings below may reference images not displayed]

ACR Breast Density Category b: There are scattered areas of
fibroglandular density.
FINDINGS: There are no findings suspicious for malignancy. Images were
processed with CAD.
IMPRESSION: No mammographic evidence of malignancy. A result letter of this
screening mammogram will be mailed directly to the patient.

RECOMMENDATION:
Screening mammogram in one year. (Code:[id number])

BI-RADS CATEGORY  1: Negative.

## 2021-02-17 DIAGNOSIS — J9801 Acute bronchospasm: Secondary | ICD-10-CM | POA: Diagnosis not present

## 2021-02-17 DIAGNOSIS — Z1331 Encounter for screening for depression: Secondary | ICD-10-CM | POA: Diagnosis not present

## 2021-02-17 DIAGNOSIS — N390 Urinary tract infection, site not specified: Secondary | ICD-10-CM | POA: Diagnosis not present

## 2021-02-17 DIAGNOSIS — F419 Anxiety disorder, unspecified: Secondary | ICD-10-CM | POA: Diagnosis not present

## 2021-02-17 DIAGNOSIS — E890 Postprocedural hypothyroidism: Secondary | ICD-10-CM | POA: Diagnosis not present

## 2021-02-17 DIAGNOSIS — Z0000 Encounter for general adult medical examination without abnormal findings: Secondary | ICD-10-CM | POA: Diagnosis not present

## 2021-02-17 DIAGNOSIS — N329 Bladder disorder, unspecified: Secondary | ICD-10-CM | POA: Diagnosis not present

## 2021-02-17 DIAGNOSIS — D869 Sarcoidosis, unspecified: Secondary | ICD-10-CM | POA: Diagnosis not present

## 2021-02-17 DIAGNOSIS — D519 Vitamin B12 deficiency anemia, unspecified: Secondary | ICD-10-CM | POA: Diagnosis not present

## 2021-02-17 DIAGNOSIS — N200 Calculus of kidney: Secondary | ICD-10-CM | POA: Diagnosis not present

## 2021-02-18 DIAGNOSIS — L814 Other melanin hyperpigmentation: Secondary | ICD-10-CM | POA: Diagnosis not present

## 2021-02-18 DIAGNOSIS — L820 Inflamed seborrheic keratosis: Secondary | ICD-10-CM | POA: Diagnosis not present

## 2021-02-18 DIAGNOSIS — D485 Neoplasm of uncertain behavior of skin: Secondary | ICD-10-CM | POA: Diagnosis not present

## 2021-02-18 DIAGNOSIS — L570 Actinic keratosis: Secondary | ICD-10-CM | POA: Diagnosis not present

## 2021-02-18 DIAGNOSIS — L578 Other skin changes due to chronic exposure to nonionizing radiation: Secondary | ICD-10-CM | POA: Diagnosis not present

## 2021-02-18 DIAGNOSIS — D225 Melanocytic nevi of trunk: Secondary | ICD-10-CM | POA: Diagnosis not present

## 2021-02-26 DIAGNOSIS — D0359 Melanoma in situ of other part of trunk: Secondary | ICD-10-CM | POA: Diagnosis not present

## 2021-03-17 DIAGNOSIS — E538 Deficiency of other specified B group vitamins: Secondary | ICD-10-CM | POA: Diagnosis not present

## 2021-03-17 DIAGNOSIS — N390 Urinary tract infection, site not specified: Secondary | ICD-10-CM | POA: Diagnosis not present

## 2021-03-18 DIAGNOSIS — Z1231 Encounter for screening mammogram for malignant neoplasm of breast: Secondary | ICD-10-CM | POA: Diagnosis not present

## 2021-03-24 DIAGNOSIS — Z9884 Bariatric surgery status: Secondary | ICD-10-CM | POA: Diagnosis not present

## 2021-03-24 DIAGNOSIS — R1012 Left upper quadrant pain: Secondary | ICD-10-CM | POA: Diagnosis not present

## 2021-04-03 DIAGNOSIS — D0359 Melanoma in situ of other part of trunk: Secondary | ICD-10-CM | POA: Diagnosis not present

## 2021-04-08 DIAGNOSIS — Z9884 Bariatric surgery status: Secondary | ICD-10-CM | POA: Diagnosis not present

## 2021-04-08 DIAGNOSIS — N200 Calculus of kidney: Secondary | ICD-10-CM | POA: Diagnosis not present

## 2021-04-14 DIAGNOSIS — N390 Urinary tract infection, site not specified: Secondary | ICD-10-CM | POA: Diagnosis not present

## 2021-04-15 ENCOUNTER — Other Ambulatory Visit: Payer: Self-pay | Admitting: *Deleted

## 2021-04-15 DIAGNOSIS — Z9884 Bariatric surgery status: Secondary | ICD-10-CM

## 2021-04-15 DIAGNOSIS — N200 Calculus of kidney: Secondary | ICD-10-CM

## 2021-04-22 DIAGNOSIS — R922 Inconclusive mammogram: Secondary | ICD-10-CM | POA: Diagnosis not present

## 2021-04-22 DIAGNOSIS — R928 Other abnormal and inconclusive findings on diagnostic imaging of breast: Secondary | ICD-10-CM | POA: Diagnosis not present

## 2021-04-22 DIAGNOSIS — R921 Mammographic calcification found on diagnostic imaging of breast: Secondary | ICD-10-CM | POA: Diagnosis not present

## 2021-04-24 ENCOUNTER — Other Ambulatory Visit: Payer: Self-pay | Admitting: Internal Medicine

## 2021-04-24 DIAGNOSIS — R928 Other abnormal and inconclusive findings on diagnostic imaging of breast: Secondary | ICD-10-CM

## 2021-04-28 DIAGNOSIS — B9620 Unspecified Escherichia coli [E. coli] as the cause of diseases classified elsewhere: Secondary | ICD-10-CM | POA: Diagnosis not present

## 2021-04-28 DIAGNOSIS — N390 Urinary tract infection, site not specified: Secondary | ICD-10-CM | POA: Diagnosis not present

## 2021-04-28 DIAGNOSIS — R8290 Unspecified abnormal findings in urine: Secondary | ICD-10-CM | POA: Diagnosis not present

## 2021-05-04 ENCOUNTER — Ambulatory Visit
Admission: RE | Admit: 2021-05-04 | Discharge: 2021-05-04 | Disposition: A | Discharge status: Home / Self Care | Payer: BC Managed Care – PPO | Source: Ambulatory Visit | Attending: Internal Medicine | Admitting: Internal Medicine

## 2021-05-04 ENCOUNTER — Other Ambulatory Visit: Payer: Self-pay

## 2021-05-04 DIAGNOSIS — R928 Other abnormal and inconclusive findings on diagnostic imaging of breast: Secondary | ICD-10-CM

## 2021-05-04 DIAGNOSIS — N6012 Diffuse cystic mastopathy of left breast: Secondary | ICD-10-CM | POA: Diagnosis not present

## 2021-05-04 DIAGNOSIS — R921 Mammographic calcification found on diagnostic imaging of breast: Secondary | ICD-10-CM | POA: Diagnosis not present

## 2021-05-18 ENCOUNTER — Ambulatory Visit
Admission: RE | Admit: 2021-05-18 | Discharge: 2021-05-18 | Disposition: A | Discharge status: Home / Self Care | Payer: BC Managed Care – PPO | Source: Ambulatory Visit | Attending: Surgery | Admitting: Surgery

## 2021-05-18 DIAGNOSIS — Z9884 Bariatric surgery status: Secondary | ICD-10-CM

## 2021-05-18 DIAGNOSIS — K760 Fatty (change of) liver, not elsewhere classified: Secondary | ICD-10-CM | POA: Diagnosis not present

## 2021-05-18 DIAGNOSIS — N200 Calculus of kidney: Secondary | ICD-10-CM

## 2021-05-18 DIAGNOSIS — R109 Unspecified abdominal pain: Secondary | ICD-10-CM | POA: Diagnosis not present

## 2021-05-19 DIAGNOSIS — N200 Calculus of kidney: Secondary | ICD-10-CM | POA: Diagnosis not present

## 2021-05-19 DIAGNOSIS — N390 Urinary tract infection, site not specified: Secondary | ICD-10-CM | POA: Diagnosis not present

## 2021-05-19 DIAGNOSIS — R8290 Unspecified abnormal findings in urine: Secondary | ICD-10-CM | POA: Diagnosis not present

## 2021-06-08 DIAGNOSIS — N390 Urinary tract infection, site not specified: Secondary | ICD-10-CM | POA: Diagnosis not present

## 2021-06-08 DIAGNOSIS — R8290 Unspecified abnormal findings in urine: Secondary | ICD-10-CM | POA: Diagnosis not present

## 2021-06-08 DIAGNOSIS — Z87442 Personal history of urinary calculi: Secondary | ICD-10-CM | POA: Diagnosis not present

## 2021-06-08 DIAGNOSIS — N200 Calculus of kidney: Secondary | ICD-10-CM | POA: Diagnosis not present

## 2021-06-08 DIAGNOSIS — Z90710 Acquired absence of both cervix and uterus: Secondary | ICD-10-CM | POA: Diagnosis not present

## 2021-06-08 DIAGNOSIS — I700 Atherosclerosis of aorta: Secondary | ICD-10-CM | POA: Diagnosis not present

## 2021-06-08 DIAGNOSIS — Z9889 Other specified postprocedural states: Secondary | ICD-10-CM | POA: Diagnosis not present

## 2021-08-24 DIAGNOSIS — N390 Urinary tract infection, site not specified: Secondary | ICD-10-CM | POA: Diagnosis not present

## 2021-08-24 DIAGNOSIS — Z87442 Personal history of urinary calculi: Secondary | ICD-10-CM | POA: Diagnosis not present

## 2021-08-24 DIAGNOSIS — N200 Calculus of kidney: Secondary | ICD-10-CM | POA: Diagnosis not present

## 2021-08-24 DIAGNOSIS — R8290 Unspecified abnormal findings in urine: Secondary | ICD-10-CM | POA: Diagnosis not present

## 2021-08-24 DIAGNOSIS — Z09 Encounter for follow-up examination after completed treatment for conditions other than malignant neoplasm: Secondary | ICD-10-CM | POA: Diagnosis not present

## 2021-08-24 DIAGNOSIS — Z87440 Personal history of urinary (tract) infections: Secondary | ICD-10-CM | POA: Diagnosis not present

## 2021-09-09 DIAGNOSIS — E538 Deficiency of other specified B group vitamins: Secondary | ICD-10-CM | POA: Insufficient documentation

## 2021-09-09 DIAGNOSIS — F321 Major depressive disorder, single episode, moderate: Secondary | ICD-10-CM | POA: Diagnosis present

## 2021-09-09 DIAGNOSIS — Z79899 Other long term (current) drug therapy: Secondary | ICD-10-CM | POA: Diagnosis not present

## 2021-09-09 DIAGNOSIS — Z85850 Personal history of malignant neoplasm of thyroid: Secondary | ICD-10-CM | POA: Diagnosis not present

## 2021-09-09 DIAGNOSIS — R8290 Unspecified abnormal findings in urine: Secondary | ICD-10-CM | POA: Diagnosis not present

## 2021-09-09 DIAGNOSIS — F411 Generalized anxiety disorder: Secondary | ICD-10-CM | POA: Diagnosis not present

## 2021-09-09 DIAGNOSIS — Z0000 Encounter for general adult medical examination without abnormal findings: Secondary | ICD-10-CM | POA: Diagnosis not present

## 2021-09-09 DIAGNOSIS — R7989 Other specified abnormal findings of blood chemistry: Secondary | ICD-10-CM | POA: Diagnosis not present

## 2021-09-09 DIAGNOSIS — F5101 Primary insomnia: Secondary | ICD-10-CM | POA: Diagnosis not present

## 2021-09-09 DIAGNOSIS — R3915 Urgency of urination: Secondary | ICD-10-CM | POA: Diagnosis not present

## 2021-09-09 DIAGNOSIS — K219 Gastro-esophageal reflux disease without esophagitis: Secondary | ICD-10-CM | POA: Diagnosis not present

## 2021-09-09 DIAGNOSIS — Z87442 Personal history of urinary calculi: Secondary | ICD-10-CM | POA: Diagnosis not present

## 2021-09-09 DIAGNOSIS — E039 Hypothyroidism, unspecified: Secondary | ICD-10-CM | POA: Diagnosis not present

## 2021-10-12 DIAGNOSIS — H2513 Age-related nuclear cataract, bilateral: Secondary | ICD-10-CM | POA: Diagnosis not present

## 2021-10-12 DIAGNOSIS — H04123 Dry eye syndrome of bilateral lacrimal glands: Secondary | ICD-10-CM | POA: Diagnosis not present

## 2021-10-12 DIAGNOSIS — H18413 Arcus senilis, bilateral: Secondary | ICD-10-CM | POA: Diagnosis not present

## 2021-10-12 DIAGNOSIS — H2511 Age-related nuclear cataract, right eye: Secondary | ICD-10-CM | POA: Diagnosis not present

## 2021-10-12 DIAGNOSIS — H18513 Endothelial corneal dystrophy, bilateral: Secondary | ICD-10-CM | POA: Diagnosis not present

## 2021-10-26 DIAGNOSIS — H04123 Dry eye syndrome of bilateral lacrimal glands: Secondary | ICD-10-CM | POA: Diagnosis not present

## 2021-10-26 DIAGNOSIS — H2513 Age-related nuclear cataract, bilateral: Secondary | ICD-10-CM | POA: Diagnosis not present

## 2021-10-26 DIAGNOSIS — H18413 Arcus senilis, bilateral: Secondary | ICD-10-CM | POA: Diagnosis not present

## 2021-10-26 DIAGNOSIS — H25013 Cortical age-related cataract, bilateral: Secondary | ICD-10-CM | POA: Diagnosis not present

## 2021-12-16 DIAGNOSIS — L570 Actinic keratosis: Secondary | ICD-10-CM | POA: Diagnosis not present

## 2021-12-16 DIAGNOSIS — D485 Neoplasm of uncertain behavior of skin: Secondary | ICD-10-CM | POA: Diagnosis not present

## 2021-12-16 DIAGNOSIS — D225 Melanocytic nevi of trunk: Secondary | ICD-10-CM | POA: Diagnosis not present

## 2021-12-16 DIAGNOSIS — L578 Other skin changes due to chronic exposure to nonionizing radiation: Secondary | ICD-10-CM | POA: Diagnosis not present

## 2021-12-16 DIAGNOSIS — L814 Other melanin hyperpigmentation: Secondary | ICD-10-CM | POA: Diagnosis not present

## 2021-12-16 DIAGNOSIS — Z85820 Personal history of malignant melanoma of skin: Secondary | ICD-10-CM | POA: Diagnosis not present

## 2021-12-21 DIAGNOSIS — D225 Melanocytic nevi of trunk: Secondary | ICD-10-CM | POA: Diagnosis not present

## 2022-01-05 DIAGNOSIS — E039 Hypothyroidism, unspecified: Secondary | ICD-10-CM | POA: Diagnosis not present

## 2022-02-22 DIAGNOSIS — R5383 Other fatigue: Secondary | ICD-10-CM | POA: Diagnosis not present

## 2022-02-22 DIAGNOSIS — R5381 Other malaise: Secondary | ICD-10-CM | POA: Diagnosis not present

## 2022-02-22 DIAGNOSIS — E039 Hypothyroidism, unspecified: Secondary | ICD-10-CM | POA: Diagnosis not present

## 2022-02-22 DIAGNOSIS — K219 Gastro-esophageal reflux disease without esophagitis: Secondary | ICD-10-CM | POA: Diagnosis not present

## 2022-02-22 DIAGNOSIS — E538 Deficiency of other specified B group vitamins: Secondary | ICD-10-CM | POA: Diagnosis not present

## 2022-02-22 DIAGNOSIS — F321 Major depressive disorder, single episode, moderate: Secondary | ICD-10-CM | POA: Diagnosis not present

## 2022-04-02 ENCOUNTER — Encounter (HOSPITAL_COMMUNITY): Payer: Self-pay | Admitting: *Deleted

## 2022-04-08 DIAGNOSIS — D485 Neoplasm of uncertain behavior of skin: Secondary | ICD-10-CM | POA: Diagnosis not present

## 2022-05-29 DIAGNOSIS — R519 Headache, unspecified: Secondary | ICD-10-CM | POA: Diagnosis not present

## 2022-05-29 DIAGNOSIS — R059 Cough, unspecified: Secondary | ICD-10-CM | POA: Diagnosis not present

## 2022-08-10 DIAGNOSIS — H16223 Keratoconjunctivitis sicca, not specified as Sjogren's, bilateral: Secondary | ICD-10-CM | POA: Diagnosis not present

## 2022-09-14 DIAGNOSIS — Z0000 Encounter for general adult medical examination without abnormal findings: Secondary | ICD-10-CM | POA: Diagnosis not present

## 2023-01-06 DIAGNOSIS — R053 Chronic cough: Secondary | ICD-10-CM | POA: Diagnosis not present

## 2023-01-06 DIAGNOSIS — Z981 Arthrodesis status: Secondary | ICD-10-CM | POA: Diagnosis not present

## 2023-01-06 DIAGNOSIS — R918 Other nonspecific abnormal finding of lung field: Secondary | ICD-10-CM | POA: Diagnosis not present

## 2023-01-06 DIAGNOSIS — R058 Other specified cough: Secondary | ICD-10-CM | POA: Diagnosis not present

## 2023-01-06 DIAGNOSIS — K222 Esophageal obstruction: Secondary | ICD-10-CM | POA: Diagnosis not present

## 2023-01-06 DIAGNOSIS — Z862 Personal history of diseases of the blood and blood-forming organs and certain disorders involving the immune mechanism: Secondary | ICD-10-CM | POA: Diagnosis not present

## 2023-01-18 ENCOUNTER — Encounter: Payer: Self-pay | Admitting: Internal Medicine

## 2023-02-02 ENCOUNTER — Encounter: Payer: Self-pay | Admitting: Internal Medicine

## 2023-02-02 ENCOUNTER — Ambulatory Visit (INDEPENDENT_AMBULATORY_CARE_PROVIDER_SITE_OTHER): Payer: BC Managed Care – PPO | Admitting: Internal Medicine

## 2023-02-02 VITALS — BP 130/86 | HR 69 | Ht 67.0 in | Wt 182.1 lb

## 2023-02-02 DIAGNOSIS — Z1211 Encounter for screening for malignant neoplasm of colon: Secondary | ICD-10-CM | POA: Diagnosis not present

## 2023-02-02 DIAGNOSIS — D860 Sarcoidosis of lung: Secondary | ICD-10-CM

## 2023-02-02 DIAGNOSIS — K219 Gastro-esophageal reflux disease without esophagitis: Secondary | ICD-10-CM

## 2023-02-02 DIAGNOSIS — R059 Cough, unspecified: Secondary | ICD-10-CM | POA: Diagnosis not present

## 2023-02-02 DIAGNOSIS — R1319 Other dysphagia: Secondary | ICD-10-CM | POA: Diagnosis not present

## 2023-02-02 MED ORDER — NA SULFATE-K SULFATE-MG SULF 17.5-3.13-1.6 GM/177ML PO SOLN: 1.0000 | Freq: Once | ORAL | 0 refills | Status: AC

## 2023-02-02 MED ORDER — PANTOPRAZOLE SODIUM 40 MG PO TBEC: 40.0000 mg | DELAYED_RELEASE_TABLET | Freq: Every day | ORAL | 3 refills | Status: DC

## 2023-02-02 NOTE — Patient Instructions (Signed)
We have sent the following medications to your pharmacy for you to pick up at your convenience:pantoprazole.   You have been scheduled for an endoscopy and colonoscopy. Please follow the written instructions given to you at your visit today.  Please pick up your prep supplies at the pharmacy within the next 1-3 days.  If you use inhalers (even only as needed), please bring them with you on the day of your procedure.  DO NOT TAKE 7 DAYS PRIOR TO TEST- Trulicity (dulaglutide) Ozempic, Wegovy (semaglutide) Mounjaro (tirzepatide) Bydureon Bcise (exanatide extended release)  DO NOT TAKE 1 DAY PRIOR TO YOUR TEST Rybelsus (semaglutide) Adlyxin (lixisenatide) Victoza (liraglutide) Byetta (exanatide) ___________________________________________________________________________   We have referred your to [facility]. They will contact you with an appointment date and time.   _______________________________________________________  If your blood pressure at your visit was 140/90 or greater, please contact your primary care physician to follow up on this.  _______________________________________________________  If you are age 31 or older, your body mass index should be between 23-30. Your Body mass index is 28.52 kg/m. If this is out of the aforementioned range listed, please consider follow up with your Primary Care Provider.  If you are age 24 or younger, your body mass index should be between 19-25. Your Body mass index is 28.52 kg/m. If this is out of the aformentioned range listed, please consider follow up with your Primary Care Provider.   ________________________________________________________  The [facility] providers would like to encourage you to use [facility] to communicate with providers for non-urgent requests or questions.  Due to long hold times on the telephone, sending your provider a message by [facility] may be a faster and more efficient way to get a response.  Please  allow 48 business hours for a response.  Please remember that this is for non-urgent requests.  _______________________________________________________

## 2023-02-02 NOTE — Progress Notes (Signed)
Patient ID: Tammy Wilson, female   DOB: Feb 25, 1966, 57 y.o.   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 Sudie Bailey to evaluate esophageal dysphagia and consider colon cancer screening.  Patient is seen alone today.  She reports that she has developed solid food dysphagia within the last year.  Food gets stuck in her chest after swallowing.  This is intermittent.  Not associated with any new weight loss.  Occasionally she has to call for bring this food back up versus helping it down with liquids.  She does notice fairly regular coughing.  Not having much heartburn because she is taking pantoprazole 40 mg daily.  She did have significant heartburn while she had her gastric sleeve but this improved tremendously after Roux-en-Y surgery.  Her net weight loss after gastric surgery was 100 pounds.  She initially lost 70 pounds with gastric sleeve but gained 30 back.  She has lost an additional 60 pounds since Roux-en-Y in 2021.  She was diagnosed with sarcoidosis but does not have a regular pulmonologist.  She does have fairly regular cough without significant shortness of breath.  She has never had a colonoscopy.  No family history of colon cancer.  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   bladder tack     GASTRIC ROUX-EN-Y N/A 09/08/2020   Procedure: LAPAROSCOPIC ROUX-EN-Y GASTRIC BYPASS WITH UPPER ENDOSCOPY, CONVERSION FROM LAPAROSCOPIC SLEEVE GASTECTOMY;  Surgeon: Luretha Murphy, MD;  Location: [facility];  Service: General;  Laterality: N/A;  3.5 HOURS TOTAL PLEASE   HIATAL HERNIA REPAIR N/A 09/08/2020   Procedure: HERNIA REPAIR HIATAL;  Surgeon: Luretha Murphy, MD;  Location: [facility];  Service: General;  Laterality: N/A;   LAPAROSCOPIC GASTRIC SLEEVE RESECTION N/A 06/09/2015   Procedure: LAPAROSCOPIC GASTRIC SLEEVE RESECTION;  Surgeon: Luretha Murphy, MD;  Location: [facility];  Service: General;  Laterality: N/A;   NECK SURGERY     Cervial fusion '12-[facility] - Dr. Jordan Likes   SINUSOTOMY     THYROIDECTOMY  2019   TUBAL LIGATION     UPPER GI ENDOSCOPY N/A 06/09/2015   Procedure: UPPER GI ENDOSCOPY;  Surgeon: Luretha Murphy, MD;  Location: [facility];  Service: General;  Laterality: N/A;   UPPER GI ENDOSCOPY N/A 09/08/2020   Procedure: UPPER GI ENDOSCOPY;  Surgeon: Luretha Murphy, MD;  Location: [facility];  Service: General;  Laterality: N/A;    Outpatient Medications Prior to Visit  Medication Sig Dispense Refill   buPROPion (WELLBUTRIN XL) 300 MG 24 hr tablet Take 300 mg by mouth daily.     Cyanocobalamin (VITAMIN B-12 PO) Take 1 tablet by mouth daily.     levothyroxine (SYNTHROID) 112 MCG tablet Take 112 mcg by mouth every evening.  LORazepam (ATIVAN) 1 MG tablet Take 1 mg by mouth 3 (three) times daily as needed for anxiety.     venlafaxine XR (EFFEXOR-XR) 150 MG 24 hr capsule Take 150 mg by mouth daily. with food     pantoprazole (PROTONIX) 40 MG tablet Take 1 tablet (40 mg total) by mouth daily. 90 tablet 0   ondansetron (ZOFRAN-ODT) 4 MG disintegrating tablet Take 1 tablet (4 mg total) by mouth every 6 (six) hours as needed for nausea or vomiting. (Patient not taking: Reported on 02/02/2023) 20 tablet 0   gabapentin (NEURONTIN) 100 MG capsule Take 2 capsules (200 mg total)  by mouth every 12 (twelve) hours. 20 capsule 0   gabapentin (NEURONTIN) 100 MG capsule TAKE 2 CAPSULES BY MOUTH EVERY 12 HOURS 20 capsule 0   liothyronine (CYTOMEL) 5 MCG tablet Take 5 mcg by mouth daily before breakfast.     oxyCODONE (OXY IR/ROXICODONE) 5 MG immediate release tablet Take 1 tablet (5 mg total) by mouth every 6 (six) hours as needed for severe pain. 10 tablet 0   pantoprazole (PROTONIX) 40 MG tablet TAKE 1 TABLET BY MOUTH ONCE A DAY 90 tablet 0   No facility-administered medications prior to visit.    Allergies  Allergen Reactions   Dilaudid [Hydromorphone Hcl] Palpitations   Sulfamethoxazole-Trimethoprim Rash    Nausea and vomitting    Family History  Problem Relation Age of Onset   Colon cancer Neg Hx    Esophageal cancer Neg Hx     Social History   Tobacco Use   Smoking status: Never   Smokeless tobacco: Never  Vaping Use   Vaping status: Never Used  Substance Use Topics   Alcohol use: Yes    Comment: very rare   Drug use: No    ROS: As per history of present illness, otherwise negative  BP 130/86   Pulse 69   Ht 5\' 7"  (1.702 m)   Wt 182 lb 2 oz (82.6 kg)   SpO2 98%   BMI 28.52 kg/m  Gen: awake, alert, NAD HEENT: anicteric  CV: RRR, no mrg Pulm: CTA b/l Abd: soft, NT/ND, +BS throughout Ext: no c/c/e Neuro: nonfocal  CHEST - 2 VIEW   COMPARISON:  04/02/2015   FINDINGS:  Cardiomediastinal silhouette and pulmonary vasculature are within  normal limits.   Numerous small nodules are seen scattered throughout the left lung  with the largest measuring 8 mm. These nodules appear to be have  been present on prior CT from 05/09/2019 which indicates a benign  etiology.   ACDF changes seen in the lower cervical spine.   IMPRESSION:  1. No acute cardiopulmonary process.  2. Multiple left lung nodules which appear to have been present on  prior CT from 05/09/2019 which indicates a benign etiology.    Electronically Signed    By: Acquanetta Belling M.D.    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 Sudie Bailey to evaluate esophageal dysphagia and consider colon cancer screening.  GERD and esophageal dysphagia --we discussed how gastric sleeve can be quite reflux promoting and she did clinically find that to be the case.  Since Roux-en-Y her heartburn symptoms has been better.  She has also been on pantoprazole 40 mg daily.  Given the dysphagia and longstanding GERD I recommended upper endoscopy.  She may  require dilation.  We discussed the risk, benefits and alternatives and she is agreeable and wishes to proceed -- Continue pantoprazole 40 mg daily; if any evidence of esophagitis I would recommend switching to a capsule PPI which can be opened and taken with applesauce or pudding to enhance absorption status post gastric bypass -- EGD in the [facility]  2.  Colon cancer screening --no prior colon cancer screening.  Colonoscopy is recommended.  We reviewed the risk, benefits and alternatives and she is agreeable and wishes to proceed -- Colonoscopy in the [facility]  3.  History of pulmonary sarcoidosis and chronic cough --she does not have regular pulmonologist.  I recommended she establish care -- [facility] pulmonology referral      ZO:XWRUEAVW, [place], [place] [address] [place],  [place] [address]

## 2023-03-02 DIAGNOSIS — N3001 Acute cystitis with hematuria: Secondary | ICD-10-CM | POA: Diagnosis not present

## 2023-03-17 DIAGNOSIS — F321 Major depressive disorder, single episode, moderate: Secondary | ICD-10-CM | POA: Diagnosis not present

## 2023-03-17 DIAGNOSIS — E538 Deficiency of other specified B group vitamins: Secondary | ICD-10-CM | POA: Diagnosis not present

## 2023-03-17 DIAGNOSIS — E039 Hypothyroidism, unspecified: Secondary | ICD-10-CM | POA: Diagnosis not present

## 2023-03-17 DIAGNOSIS — F411 Generalized anxiety disorder: Secondary | ICD-10-CM | POA: Diagnosis not present

## 2023-03-17 DIAGNOSIS — K219 Gastro-esophageal reflux disease without esophagitis: Secondary | ICD-10-CM | POA: Diagnosis not present

## 2023-03-17 DIAGNOSIS — R5381 Other malaise: Secondary | ICD-10-CM | POA: Diagnosis not present

## 2023-03-17 DIAGNOSIS — R5383 Other fatigue: Secondary | ICD-10-CM | POA: Diagnosis not present

## 2023-03-17 DIAGNOSIS — N3001 Acute cystitis with hematuria: Secondary | ICD-10-CM | POA: Diagnosis not present

## 2023-04-13 ENCOUNTER — Encounter (HOSPITAL_COMMUNITY): Payer: Self-pay | Admitting: *Deleted

## 2023-04-19 DIAGNOSIS — R8290 Unspecified abnormal findings in urine: Secondary | ICD-10-CM | POA: Diagnosis not present

## 2023-04-19 DIAGNOSIS — B9620 Unspecified Escherichia coli [E. coli] as the cause of diseases classified elsewhere: Secondary | ICD-10-CM | POA: Diagnosis not present

## 2023-04-19 DIAGNOSIS — R109 Unspecified abdominal pain: Secondary | ICD-10-CM | POA: Diagnosis not present

## 2023-04-19 DIAGNOSIS — Z1612 Extended spectrum beta lactamase (ESBL) resistance: Secondary | ICD-10-CM | POA: Diagnosis not present

## 2023-04-19 DIAGNOSIS — N390 Urinary tract infection, site not specified: Secondary | ICD-10-CM | POA: Diagnosis not present

## 2023-04-22 ENCOUNTER — Encounter: Payer: Self-pay | Admitting: Internal Medicine

## 2023-04-27 ENCOUNTER — Ambulatory Visit: Payer: BC Managed Care – PPO | Admitting: Pulmonary Disease

## 2023-04-27 ENCOUNTER — Encounter: Payer: Self-pay | Admitting: Pulmonary Disease

## 2023-04-27 VITALS — BP 126/74 | HR 88 | Ht 67.0 in | Wt 187.0 lb

## 2023-04-27 DIAGNOSIS — R918 Other nonspecific abnormal finding of lung field: Secondary | ICD-10-CM | POA: Diagnosis not present

## 2023-04-27 DIAGNOSIS — R0602 Shortness of breath: Secondary | ICD-10-CM | POA: Diagnosis not present

## 2023-04-27 MED ORDER — FLUTICASONE-SALMETEROL 250-50 MCG/ACT IN AEPB: 1.0000 | INHALATION_SPRAY | Freq: Two times a day (BID) | RESPIRATORY_TRACT | 3 refills | Status: DC

## 2023-04-27 NOTE — Progress Notes (Signed)
Tammy Wilson    [id number]    Jan 07, 1966  Primary Care Physician:Prochnau, Rayfield Citizen, MD  Referring Physician: Beverley Fiedler, MD [address] [place],  [place] [address]  Chief complaint:   Patient being seen for chronic cough History of lung nodules  HPI:  Told about lung nodules about 2019 at [facility] Was told that she has sarcoidosis  She does have a chronic cough  Cough comes and goes usually dry but sometimes productive  Coughing is worse following meals  Gastric bypass surgery, thyroidectomy, sleeve in 2011  Never smoker Mom did smoke but she does not remember being around secondhand smoke much  Film/video editor  No weight loss, no night sweats  No skin rash   Outpatient Encounter Medications as of 04/27/2023  Medication Sig   buPROPion (WELLBUTRIN XL) 300 MG 24 hr tablet Take 300 mg by mouth daily.   ciprofloxacin (CIPRO) 500 MG tablet Take by mouth.   Cyanocobalamin (VITAMIN B-12 PO) Take 1 tablet by mouth daily.   levothyroxine (SYNTHROID) 112 MCG tablet Take 112 mcg by mouth every evening.   LORazepam (ATIVAN) 1 MG tablet Take 1 mg by mouth 3 (three) times daily as needed for anxiety.   ondansetron (ZOFRAN-ODT) 4 MG disintegrating tablet Take 1 tablet (4 mg total) by mouth every 6 (six) hours as needed for nausea or vomiting.   pantoprazole (PROTONIX) 40 MG tablet Take 1 tablet (40 mg total) by mouth daily.   venlafaxine XR (EFFEXOR-XR) 150 MG 24 hr capsule Take 150 mg by mouth daily. with food   No facility-administered encounter medications on file as of 04/27/2023.    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   bladder tack     GASTRIC ROUX-EN-Y N/A 09/08/2020   Procedure: LAPAROSCOPIC ROUX-EN-Y GASTRIC BYPASS WITH UPPER ENDOSCOPY, CONVERSION FROM LAPAROSCOPIC SLEEVE GASTECTOMY;  Surgeon: Luretha Murphy, MD;  Location: [facility];  Service: General;  Laterality: N/A;  3.5 HOURS TOTAL PLEASE   HIATAL HERNIA REPAIR N/A 09/08/2020   Procedure: HERNIA REPAIR HIATAL;  Surgeon: Luretha Murphy, MD;  Location: [facility];  Service: General;  Laterality: N/A;   LAPAROSCOPIC GASTRIC SLEEVE RESECTION N/A 06/09/2015   Procedure: LAPAROSCOPIC GASTRIC SLEEVE RESECTION;  Surgeon: Luretha Murphy, MD;  Location: [facility];  Service: General;  Laterality: N/A;   NECK SURGERY     Cervial fusion '12-[facility] - Dr. Jordan Likes   SINUSOTOMY     THYROIDECTOMY  2019   TUBAL LIGATION     UPPER GI ENDOSCOPY N/A 06/09/2015   Procedure: UPPER GI ENDOSCOPY;  Surgeon: Luretha Murphy, MD;  Location: [facility];  Service: General;  Laterality: N/A;   UPPER GI ENDOSCOPY N/A 09/08/2020  Procedure: UPPER GI ENDOSCOPY;  Surgeon: Luretha Murphy, MD;  Location: [facility];  Service: General;  Laterality: N/A;    Family History  Problem Relation Age of Onset   Heart disease Mother    Rheum arthritis Mother    Colon cancer Neg Hx    Esophageal cancer Neg Hx     Social History   Socioeconomic History   Marital status: Married    Spouse name: Not on file   Number of children: Not on file   Years of education: Not on file   Highest education level: Not on file  Occupational History   Not on file  Tobacco Use   Smoking status: Never   Smokeless tobacco:  Never  Vaping Use   Vaping status: Never Used  Substance and Sexual Activity   Alcohol use: Yes    Comment: very rare   Drug use: No   Sexual activity: Not Currently  Other Topics Concern   Not on file  Social History Narrative   Not on file   Social Determinants of Health   Financial Resource Strain: Not on file  Food Insecurity: Low Risk  (03/11/2023)   Received from [facility]   Hunger Vital Sign    Worried About Running Out of Food in the Last Year: Never true    Ran Out of Food in the Last Year: Never true  Transportation Needs: No Transportation Needs (03/11/2023)   Received from [facility]    In the past 12 months, has lack of reliable transportation kept you from medical appointments, meetings, work or from getting things needed for daily living? : No  Physical Activity: Not on file  Stress: Not on file  Social Connections: Not on file  Intimate Partner Violence: Not on file    Review of Systems  Respiratory:  Positive for cough and shortness of breath.   Psychiatric/Behavioral:  Negative for sleep disturbance.     Vitals:   04/27/23 1505  BP: 126/74  Pulse: 88  SpO2: 96%     Physical Exam Constitutional:      Appearance: Normal appearance.  HENT:     Head: Normocephalic.     Mouth/Throat:     Pharynx: Oropharynx is clear.  Eyes:     General: No scleral icterus. Cardiovascular:     Rate and Rhythm: Normal rate and regular rhythm.     Heart sounds: No murmur heard.    No friction rub.  Pulmonary:     Effort: No respiratory distress.     Breath sounds: No stridor. No wheezing or rhonchi.  Musculoskeletal:     Cervical back: No rigidity or tenderness.  Neurological:     Mental Status: She is alert.  Psychiatric:        Mood and Affect: Mood normal.    Data Reviewed: CT report from [place] noted showing multiple neck nodules, mediastinal adenopathy  Records from care everywhere reviewed  Assessment:  Chronic  cough  Multiple lung nodules  History of sarcoidosis -Does not remember being offered any treatment for the sarcoidosis -The discussion she remembers at the time was that it was not active  Shortness of breath on exertion  Plan/Recommendations: Schedule for pulmonary function test  Schedule for CT scan of the chest with contrast  Prescription for Wixela to be used twice a day -This may help with the cough and if it is related to sarcoidosis  Following obtaining CT scan of the chest, may require markers of  inflammation including ESR, CRP     Virl Diamond MD [facility] 04/27/2023, 3:12 PM  CC: Pyrtle, Carie Caddy, MD

## 2023-04-27 NOTE — Patient Instructions (Signed)
We will schedule you for breathing study  We will schedule for CT scan of the chest with contrast to assess lymph nodes and the lung nodules  Inhaler Wixela called into pharmacy for you to be used twice a day this may help the coughing  Make sure you follow-up with your eye doctor on a regular basis

## 2023-05-06 ENCOUNTER — Other Ambulatory Visit: Payer: BC Managed Care – PPO

## 2023-05-09 ENCOUNTER — Other Ambulatory Visit (HOSPITAL_COMMUNITY): Payer: BC Managed Care – PPO

## 2023-05-10 ENCOUNTER — Ambulatory Visit: Payer: BC Managed Care – PPO | Admitting: Internal Medicine

## 2023-05-10 ENCOUNTER — Encounter: Payer: Self-pay | Admitting: Internal Medicine

## 2023-05-10 VITALS — BP 126/68 | HR 66 | Temp 97.9°F | Resp 13 | Ht 67.0 in | Wt 182.0 lb

## 2023-05-10 DIAGNOSIS — D123 Benign neoplasm of transverse colon: Secondary | ICD-10-CM

## 2023-05-10 DIAGNOSIS — Z1211 Encounter for screening for malignant neoplasm of colon: Secondary | ICD-10-CM

## 2023-05-10 DIAGNOSIS — R1319 Other dysphagia: Secondary | ICD-10-CM | POA: Diagnosis not present

## 2023-05-10 DIAGNOSIS — D125 Benign neoplasm of sigmoid colon: Secondary | ICD-10-CM | POA: Diagnosis not present

## 2023-05-10 DIAGNOSIS — K635 Polyp of colon: Secondary | ICD-10-CM | POA: Diagnosis not present

## 2023-05-10 DIAGNOSIS — K219 Gastro-esophageal reflux disease without esophagitis: Secondary | ICD-10-CM

## 2023-05-10 MED ORDER — SODIUM CHLORIDE 0.9 % IV SOLN: 500.0000 mL | INTRAVENOUS | Status: DC

## 2023-05-10 NOTE — Progress Notes (Signed)
GASTROENTEROLOGY PROCEDURE H&P NOTE   Primary Care Physician: Philemon Kingdom, MD    Reason for Procedure:  GERD with esophageal dysphagia and colon cancer screening  Plan:    EGD and colonoscopy  Patient is appropriate for endoscopic procedure(s) in the ambulatory ([facility]) setting.  The nature of the procedure, as well as the risks, benefits, and alternatives were carefully and thoroughly reviewed with the patient. Ample time for discussion and questions allowed. The patient understood, was satisfied, and agreed to proceed.     HPI: Tammy Wilson is a 57 y.o. female who presents for EGD and colonoscopy.  Medical history as below.  Tolerated the prep.  No recent chest pain or shortness of breath.  No abdominal pain today.  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   bladder tack     GASTRIC ROUX-EN-Y N/A 09/08/2020   Procedure: LAPAROSCOPIC ROUX-EN-Y GASTRIC BYPASS WITH UPPER ENDOSCOPY, CONVERSION FROM LAPAROSCOPIC SLEEVE GASTECTOMY;  Surgeon: Luretha Murphy, MD;  Location: [facility];  Service: General;  Laterality: N/A;  3.5 HOURS TOTAL PLEASE   HIATAL HERNIA REPAIR N/A 09/08/2020   Procedure: HERNIA REPAIR HIATAL;  Surgeon: Luretha Murphy, MD;  Location: [facility];  Service: General;  Laterality: N/A;   LAPAROSCOPIC GASTRIC SLEEVE RESECTION N/A 06/09/2015   Procedure: LAPAROSCOPIC GASTRIC SLEEVE RESECTION;  Surgeon: Luretha Murphy, MD;  Location: [facility];   Service: General;  Laterality: N/A;   NECK SURGERY     Cervial fusion '12-[facility] - Dr. Jordan Likes   SINUSOTOMY     THYROIDECTOMY  2019   TUBAL LIGATION     UPPER GI ENDOSCOPY N/A 06/09/2015   Procedure: UPPER GI ENDOSCOPY;  Surgeon: Luretha Murphy, MD;  Location: [facility];  Service: General;  Laterality: N/A;   UPPER GI ENDOSCOPY N/A 09/08/2020   Procedure: UPPER GI ENDOSCOPY;  Surgeon: Luretha Murphy, MD;  Location: [facility];  Service: General;  Laterality: N/A;    Prior to Admission medications   Medication Sig Start Date End Date Taking? Authorizing Provider  Venlafaxine HCl 225 MG TB24 Take by mouth. 03/22/22  Yes [provider]  buPROPion (WELLBUTRIN XL) 300 MG 24 hr tablet Take 300 mg by mouth daily. 08/06/20   [provider]  Cyanocobalamin (VITAMIN B-12 PO) Take 1 tablet by mouth daily.    [provider]  fluticasone-salmeterol (WIXELA INHUB) 250-50 MCG/ACT AEPB Inhale 1 puff into the lungs in the morning and at bedtime. 04/27/23   Tomma Lightning, MD  levothyroxine (SYNTHROID) 112 MCG tablet Take 112 mcg by mouth every evening. 06/24/20   [provider]  LORazepam (ATIVAN) 1 MG tablet Take 1 mg by mouth 3 (three) times daily as needed for anxiety. 08/07/20   [provider]  ondansetron (ZOFRAN-ODT) 4 MG disintegrating tablet Take 1 tablet (4 mg total) by mouth every 6 (six) hours as needed for nausea or vomiting. 09/09/20  Berna Bue, MD  pantoprazole (PROTONIX) 40 MG tablet Take 1 tablet (40 mg total) by mouth daily. 02/02/23   Seraiah Nowack, Carie Caddy, MD  traZODone (DESYREL) 50 MG tablet Take 50 mg by mouth at bedtime.    [provider]  venlafaxine XR (EFFEXOR-XR) 150 MG 24 hr capsule Take 150 mg by mouth daily. with food 08/06/20   [provider]    Current Outpatient Medications  Medication Sig Dispense Refill   Venlafaxine HCl 225 MG TB24 Take by mouth.     buPROPion (WELLBUTRIN XL) 300 MG 24 hr tablet Take 300 mg by mouth  daily.     Cyanocobalamin (VITAMIN B-12 PO) Take 1 tablet by mouth daily.     fluticasone-salmeterol (WIXELA INHUB) 250-50 MCG/ACT AEPB Inhale 1 puff into the lungs in the morning and at bedtime. 1 each 3   levothyroxine (SYNTHROID) 112 MCG tablet Take 112 mcg by mouth every evening.     LORazepam (ATIVAN) 1 MG tablet Take 1 mg by mouth 3 (three) times daily as needed for anxiety.     ondansetron (ZOFRAN-ODT) 4 MG disintegrating tablet Take 1 tablet (4 mg total) by mouth every 6 (six) hours as needed for nausea or vomiting. 20 tablet 0   pantoprazole (PROTONIX) 40 MG tablet Take 1 tablet (40 mg total) by mouth daily. 90 tablet 3   traZODone (DESYREL) 50 MG tablet Take 50 mg by mouth at bedtime.     venlafaxine XR (EFFEXOR-XR) 150 MG 24 hr capsule Take 150 mg by mouth daily. with food     Current Facility-Administered Medications  Medication Dose Route Frequency Provider Last Rate Last Admin   0.9 %  sodium chloride infusion  500 mL Intravenous Continuous Roen Macgowan, Carie Caddy, MD        Allergies as of 05/10/2023 - Review Complete 05/10/2023  Allergen Reaction Noted   Dilaudid [hydromorphone hcl] Palpitations 04/07/2015   Sulfamethoxazole-trimethoprim Rash 04/14/2018    Family History  Problem Relation Age of Onset   Heart disease Mother    Rheum arthritis Mother    Colon cancer Neg Hx    Esophageal cancer Neg Hx    Stomach cancer Neg Hx    Rectal cancer Neg Hx     Social History   Socioeconomic History   Marital status: Married    Spouse name: Not on file   Number of children: Not on file   Years of education: Not on file   Highest education level: Not on file  Occupational History   Not on file  Tobacco Use   Smoking status: Never   Smokeless tobacco: Never  Vaping Use   Vaping status: Never Used  Substance and Sexual Activity   Alcohol use: Yes    Comment: very rare   Drug use: No   Sexual activity: Not Currently  Other Topics Concern   Not on file  Social History  Narrative   Not on file   Social Determinants of Health   Financial Resource Strain: Not on file  Food Insecurity: Low Risk  (03/11/2023)   Received from [facility]   Hunger Vital Sign    Worried About Running Out of Food in the Last Year: Never true    Ran Out of Food in the Last Year: Never true  Transportation Needs: No Transportation Needs (03/11/2023)   Received from [facility]    In the past 12 months, has lack of reliable transportation kept you from medical appointments, meetings,  work or from getting things needed for daily living? : No  Physical Activity: Not on file  Stress: Not on file  Social Connections: Not on file  Intimate Partner Violence: Not on file    Physical Exam: Vital signs in last 24 hours: @BP  (!) 130/92   Pulse 75   Temp 97.9 F (36.6 C)   Ht 5\' 7"  (1.702 m)   Wt 182 lb (82.6 kg)   SpO2 96%   BMI 28.51 kg/m  GEN: NAD EYE: Sclerae anicteric ENT: MMM CV: Non-tachycardic Pulm: CTA b/l GI: Soft, NT/ND NEURO:  Alert & Oriented x 3   Erick Blinks, MD [facility]  05/10/2023 10:40 AM

## 2023-05-10 NOTE — Op Note (Signed)
[facility] Patient Name: Tammy Wilson Procedure Date: 05/10/2023 10:45 AM MRN: [id number] Endoscopist: Beverley Fiedler , MD, [id number] Age: 57 Referring MD:  Date of Birth: [birth year] Gender: Female Account #: [id number] Procedure:                Colonoscopy Indications:              Screening for colorectal malignant neoplasm, This                            is the patient's first colonoscopy Medicines:                Monitored Anesthesia Care Procedure:                Pre-Anesthesia Assessment:                           - Prior to the procedure, a History and Physical                            was performed, and patient medications and                            allergies were reviewed. The patient's tolerance of                            previous anesthesia was also reviewed. The risks                            and benefits of the procedure and the sedation                            options and risks were discussed with the patient.                            All questions were answered, and informed consent                            was obtained. Prior Anticoagulants: The patient has                            taken no anticoagulant or antiplatelet agents. ASA                            Grade Assessment: II - A patient with mild systemic                            disease. After reviewing the risks and benefits,                            the patient was deemed in satisfactory condition to                            undergo the procedure.  After obtaining informed consent, the colonoscope                            was passed under direct vision. Throughout the                            procedure, the patient's blood pressure, pulse, and                            oxygen saturations were monitored continuously. The                            Olympus Scope SN [id number] was introduced through the                            anus and advanced  to the cecum, identified by                            appendiceal orifice and ileocecal valve. The                            colonoscopy was performed without difficulty. The                            patient tolerated the procedure well. The quality                            of the bowel preparation was good. The ileocecal                            valve, appendiceal orifice, and rectum were                            photographed. Scope In: 10:58:41 AM Scope Out: 11:12:49 AM Scope Withdrawal Time: 0 hours 10 minutes 54 seconds  Total Procedure Duration: 0 hours 14 minutes 8 seconds  Findings:                 The digital rectal exam was normal.                           A 5 mm polyp was found in the hepatic flexure. The                            polyp was sessile. The polyp was removed with a                            cold snare. Resection and retrieval were complete.                           A 5 mm polyp was found in the sigmoid colon. The                            polyp was sessile. The  polyp was removed with a                            cold snare. Resection and retrieval were complete.                           Multiple medium-mouthed and small-mouthed                            diverticula were found in the sigmoid colon and                            descending colon.                           Internal hemorrhoids were found during                            retroflexion. The hemorrhoids were medium-sized. Complications:            No immediate complications. Estimated Blood Loss:     Estimated blood loss was minimal. Impression:               - One 5 mm polyp at the hepatic flexure, removed                            with a cold snare. Resected and retrieved.                           - One 5 mm polyp in the sigmoid colon, removed with                            a cold snare. Resected and retrieved.                           - Moderate diverticulosis in the sigmoid colon  and                            in the descending colon.                           - Internal hemorrhoids. Recommendation:           - Patient has a contact number available for                            emergencies. The signs and symptoms of potential                            delayed complications were discussed with the                            patient. Return to normal activities tomorrow.                            Written discharge instructions were provided to the  patient.                           - Resume previous diet.                           - Continue present medications.                           - Await pathology results.                           - Repeat colonoscopy is recommended. The                            colonoscopy date will be determined after pathology                            results from today's exam become available for                            review. Beverley Fiedler, MD 05/10/2023 11:32:01 AM This report has been signed electronically.

## 2023-05-10 NOTE — Op Note (Signed)
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 The patient's tolerance of                            previous anesthesia was also reviewed. The risks                            and benefits of the procedure and the sedation                            options and risks were discussed with the patient.                            All questions were answered, and informed consent                            was obtained. Prior Anticoagulants: The patient has                            taken no anticoagulant or antiplatelet agents. ASA                            Grade Assessment: II - A patient with mild systemic                            disease. After reviewing the risks and benefits,                            the patient was deemed in satisfactory condition to  undergo the procedure.                           After obtaining informed consent, the endoscope was                            passed under direct vision. Throughout the                            procedure, the patient's blood pressure, pulse, and                            oxygen saturations were monitored continuously. The                            Olympus scope [id number] was introduced through  the                            mouth, and advanced to the efferent jejunal loop.                            The upper GI endoscopy was accomplished without                            difficulty. The patient tolerated the procedure                            well. Scope In: Scope Out: Findings:                 The lumen of the middle third of the esophagus and                            lower third of the esophagus was mildly dilated and                            tortuous.                           The lower esophageal sphincter and GE junction is                            widely patent.                           A 2 cm hiatal hernia was present. The diaphragmatic                            hiatus is causing the gastric pouch to appear even                            smaller.                           Evidence of a Roux-en-Y gastrojejunostomy was  found. The gastric pouch is normal, though there is                            a hiatal hernia. The gastrojejunal anastomosis was                            characterized by healthy appearing mucosa. This was                            traversed. The pouch-to-jejunum limb was                            characterized by healthy appearing mucosa. The                            efferent jejunum was characterized by healthy                            appearing mucosa. The duodenum-to-jejunum limb was                            not examined as it could not be found. The excluded                            stomach was not examined as it could not be found. Complications:            No immediate complications. Estimated Blood Loss:     Estimated blood loss: none. Impression:               - Dilation in the middle third of the esophagus and                            in the lower third of the esophagus with tortuosity                            (query dysmotility). No stricture or narrowing.                           - 2 cm  hiatal hernia. This may, in part, explain                            dysphagia symptoms.                           - Roux-en-Y gastrojejunostomy with gastrojejunal                            anastomosis characterized by healthy appearing                            mucosa.                           - No specimens collected. Recommendation:           -  Patient has a contact number available for                            emergencies. The signs and symptoms of potential                            delayed complications were discussed with the                            patient. Return to normal activities tomorrow.                            Written discharge instructions were provided to the                            patient.                           - Resume previous diet.                           - Continue present medications.                           - Perform barium esophagram to further evaluation                            esophageal motility and hiatal hernia anatomy. Beverley Fiedler, MD 05/10/2023 11:29:38 AM This report has been signed electronically.

## 2023-05-10 NOTE — Progress Notes (Signed)
VS by NS. 

## 2023-05-10 NOTE — Progress Notes (Signed)
Report to PACU, RN, vss, BBS= Clear.  

## 2023-05-10 NOTE — Progress Notes (Signed)
Called to room to assist during endoscopic procedure.  Patient ID and intended procedure confirmed with present staff. Received instructions for my participation in the procedure from the performing physician.  

## 2023-05-10 NOTE — Patient Instructions (Addendum)
Educational handout provided to patient related to Hemorrhoids, Polyps, and Diverticulosis, Hiatal Hernia  Resume previous diet  Continue present medications  Awaiting pathology results   YOU HAD AN ENDOSCOPIC PROCEDURE TODAY AT THE [facility]:   Refer to the procedure report that was given to you for any specific questions about what was found during the examination.  If the procedure report does not answer your questions, please call your gastroenterologist to clarify.  If you requested that your care partner not be given the details of your procedure findings, then the procedure report has been included in a sealed envelope for you to review at your convenience later.  YOU SHOULD EXPECT: Some feelings of bloating in the abdomen. Passage of more gas than usual.  Walking can help get rid of the air that was put into your GI tract during the procedure and reduce the bloating. If you had a lower endoscopy (such as a colonoscopy or flexible sigmoidoscopy) you may notice spotting of blood in your stool or on the toilet paper. If you underwent a bowel prep for your procedure, you may not have a normal bowel movement for a few days.  Please Note:  You might notice some irritation and congestion in your nose or some drainage.  This is from the oxygen used during your procedure.  There is no need for concern and it should clear up in a day or so.  SYMPTOMS TO REPORT IMMEDIATELY:  Following lower endoscopy (colonoscopy or flexible sigmoidoscopy):  Excessive amounts of blood in the stool  Significant tenderness or worsening of abdominal pains  Swelling of the abdomen that is new, acute  Fever of 100F or higher  Following upper endoscopy (EGD)  Vomiting of blood or coffee ground material  New chest pain or pain under the shoulder blades  Painful or persistently difficult swallowing  New shortness of breath  Fever of 100F or higher  Black, tarry-looking stools  For urgent or  emergent issues, a gastroenterologist can be reached at any hour by calling [phone]. Do not use MyChart messaging for urgent concerns.    DIET:  We do recommend a small meal at first, but then you may proceed to your regular diet.  Drink plenty of fluids but you should avoid alcoholic beverages for 24 hours.  ACTIVITY:  You should plan to take it easy for the rest of today and you should NOT DRIVE or use heavy machinery until tomorrow (because of the sedation medicines used during the test).    FOLLOW UP: Our staff will call the number listed on your records the next business day following your procedure.  We will call around 7:15- 8:00 am to check on you and address any questions or concerns that you may have regarding the information given to you following your procedure. If we do not reach you, we will leave a message.     If any biopsies were taken you will be contacted by phone or by letter within the next 1-3 weeks.  Please call us at [phone] if you have not heard about the biopsies in 3 weeks.    SIGNATURES/CONFIDENTIALITY: You and/or your care partner have signed paperwork which will be entered into your electronic medical record.  These signatures attest to the fact that that the information above on your After Visit Summary has been reviewed and is understood.  Full responsibility of the confidentiality of this discharge information lies with you and/or your care-partner.

## 2023-05-11 ENCOUNTER — Telehealth: Payer: Self-pay

## 2023-05-11 NOTE — Telephone Encounter (Signed)
  Follow up Call-     05/10/2023   10:30 AM  Call back number  Post procedure Call Back phone  # [phone]  Permission to leave phone message Yes     Patient questions:  Do you have a fever, pain , or abdominal swelling? No. Pain Score  0 *  Have you tolerated food without any problems? Yes.    Have you been able to return to your normal activities? Yes.    Do you have any questions about your discharge instructions: Diet   No. Medications  No. Follow up visit  No.  Do you have questions or concerns about your Care? No.  Actions: * If pain score is 4 or above: No action needed, pain <4.

## 2023-05-12 ENCOUNTER — Telehealth: Payer: Self-pay

## 2023-05-12 ENCOUNTER — Other Ambulatory Visit: Payer: Self-pay

## 2023-05-12 DIAGNOSIS — R1319 Other dysphagia: Secondary | ICD-10-CM

## 2023-05-12 DIAGNOSIS — K219 Gastro-esophageal reflux disease without esophagitis: Secondary | ICD-10-CM

## 2023-05-12 LAB — SURGICAL PATHOLOGY

## 2023-05-12 NOTE — Telephone Encounter (Signed)
Patient informed of barium esophagram appt and instructions. Patient verbalized understanding.

## 2023-05-12 NOTE — Telephone Encounter (Signed)
Per procedure report from 05/10/23, patient to be scheduled for barium esophagram to further evaluate esophageal motility and hiatal hernia anatomy. Scheduled esophagram on 05/18/23 at [facility] hospital at 9:00am, arriving at 8:45am. Nothing to eat or drink 3 hours prior to test.   Left message for patient to return my call.

## 2023-05-17 ENCOUNTER — Encounter: Payer: Self-pay | Admitting: Internal Medicine

## 2023-05-17 DIAGNOSIS — R300 Dysuria: Secondary | ICD-10-CM | POA: Diagnosis not present

## 2023-05-17 DIAGNOSIS — Z87442 Personal history of urinary calculi: Secondary | ICD-10-CM | POA: Diagnosis not present

## 2023-05-17 DIAGNOSIS — N390 Urinary tract infection, site not specified: Secondary | ICD-10-CM | POA: Diagnosis not present

## 2023-05-17 DIAGNOSIS — R109 Unspecified abdominal pain: Secondary | ICD-10-CM | POA: Diagnosis not present

## 2023-05-18 ENCOUNTER — Other Ambulatory Visit: Payer: BC Managed Care – PPO

## 2023-05-18 ENCOUNTER — Inpatient Hospital Stay (HOSPITAL_COMMUNITY): Admission: RE | Admit: 2023-05-18 | Payer: BC Managed Care – PPO | Source: Ambulatory Visit

## 2023-05-19 ENCOUNTER — Other Ambulatory Visit: Payer: BC Managed Care – PPO

## 2023-05-30 ENCOUNTER — Ambulatory Visit
Admission: RE | Admit: 2023-05-30 | Discharge: 2023-05-30 | Disposition: A | Discharge status: Home / Self Care | Payer: BC Managed Care – PPO | Source: Ambulatory Visit | Attending: Pulmonary Disease | Admitting: Pulmonary Disease

## 2023-05-30 DIAGNOSIS — R918 Other nonspecific abnormal finding of lung field: Secondary | ICD-10-CM

## 2023-05-30 MED ORDER — IOPAMIDOL (ISOVUE-300) INJECTION 61%
75.0000 mL | Freq: Once | INTRAVENOUS | Status: AC | PRN
  Administered 2023-05-30: 75 mL via INTRAVENOUS

## 2023-06-02 ENCOUNTER — Other Ambulatory Visit: Payer: BC Managed Care – PPO

## 2023-06-06 ENCOUNTER — Encounter: Payer: Self-pay | Admitting: Internal Medicine

## 2023-06-07 ENCOUNTER — Telehealth: Payer: Self-pay | Admitting: Pulmonary Disease

## 2023-06-07 NOTE — Telephone Encounter (Signed)
Pt calling for CT results

## 2023-06-13 NOTE — Telephone Encounter (Signed)
PT is calling to discuss CT results. They found a cyst in her gi area and they want her to have an EGD. She just had one 05/10/2023 and wants to know did we see the cyst on that pathology report. Please advise.

## 2023-06-13 NOTE — Telephone Encounter (Signed)
Please let patient know that the pylorus would be very difficult to reach given her Roux-en-Y gastric bypass anatomy I would not have seen this at her recent EGD because that portion of her stomach is excluded by the Roux-en-Y bypass I have asked Dr. Meridee Score to take a look at the CT to see what he might recommend as far as additional imaging or endoscopic evaluation Once I hear back from him we will contact the patient

## 2023-06-27 ENCOUNTER — Encounter: Payer: Self-pay | Admitting: Pulmonary Disease

## 2023-06-28 ENCOUNTER — Telehealth: Payer: Self-pay

## 2023-06-28 DIAGNOSIS — N200 Calculus of kidney: Secondary | ICD-10-CM | POA: Diagnosis not present

## 2023-06-28 DIAGNOSIS — N390 Urinary tract infection, site not specified: Secondary | ICD-10-CM | POA: Diagnosis not present

## 2023-06-28 DIAGNOSIS — K5900 Constipation, unspecified: Secondary | ICD-10-CM | POA: Diagnosis not present

## 2023-06-28 DIAGNOSIS — K449 Diaphragmatic hernia without obstruction or gangrene: Secondary | ICD-10-CM | POA: Diagnosis not present

## 2023-06-28 DIAGNOSIS — R109 Unspecified abdominal pain: Secondary | ICD-10-CM | POA: Diagnosis not present

## 2023-06-28 DIAGNOSIS — K5730 Diverticulosis of large intestine without perforation or abscess without bleeding: Secondary | ICD-10-CM | POA: Diagnosis not present

## 2023-06-28 NOTE — Telephone Encounter (Signed)
-----   Message from Gordy HERO Pyrtle sent at 06/19/2023 10:38 AM EST ----- Got it. I will refer.  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 Charlean  Thank you JMP ----- Message ----- From: San Sandor GAILS, DO Sent: 06/16/2023   4:31 PM EST To: Gordy HERO Starch, MD; Aloha Wilhelmenia Raddle., MD  Honestly, I have not tried doing that.  I have reservations about how well I can make that acute turn at the JJ anastomosis with the balloon over tube in place.  I think this would be better sent to [facility] with double-balloon. ----- Message ----- From: Wilhelmenia Aloha Raddle., MD Sent: 06/14/2023   6:00 AM EST To: Gordy HERO Starch, MD; Sandor San GAILS, DO  JMP, Unlikely without a device assisted enteroscopy to be able to reach that area.  Single balloon could be helpful versus sending for double-balloon at [facility]. I would not pursue an edge gastrogastrostomy creation procedure to go after evaluating this area without device assisted attempt first.  VC, Do think a single balloon may be able to reach the pylorus?  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 Thanks [employer]

## 2023-06-28 NOTE — Telephone Encounter (Signed)
Referral faxed to [facility] for pt to have enteroscopy with Dr. Theodore Demark. Faxed to [phone].

## 2023-07-25 DIAGNOSIS — N200 Calculus of kidney: Secondary | ICD-10-CM | POA: Diagnosis not present

## 2023-08-04 DIAGNOSIS — N200 Calculus of kidney: Secondary | ICD-10-CM | POA: Diagnosis not present

## 2023-08-10 DIAGNOSIS — K219 Gastro-esophageal reflux disease without esophagitis: Secondary | ICD-10-CM | POA: Diagnosis not present

## 2023-08-10 DIAGNOSIS — R933 Abnormal findings on diagnostic imaging of other parts of digestive tract: Secondary | ICD-10-CM | POA: Diagnosis not present

## 2023-08-10 DIAGNOSIS — Z79899 Other long term (current) drug therapy: Secondary | ICD-10-CM | POA: Diagnosis not present

## 2023-08-10 DIAGNOSIS — Z79890 Hormone replacement therapy (postmenopausal): Secondary | ICD-10-CM | POA: Diagnosis not present

## 2023-08-10 DIAGNOSIS — Z9889 Other specified postprocedural states: Secondary | ICD-10-CM | POA: Diagnosis not present

## 2023-08-10 DIAGNOSIS — E039 Hypothyroidism, unspecified: Secondary | ICD-10-CM | POA: Diagnosis not present

## 2023-08-10 DIAGNOSIS — K3189 Other diseases of stomach and duodenum: Secondary | ICD-10-CM | POA: Diagnosis not present

## 2023-08-10 DIAGNOSIS — Z980 Intestinal bypass and anastomosis status: Secondary | ICD-10-CM | POA: Diagnosis not present

## 2023-08-10 DIAGNOSIS — Z9884 Bariatric surgery status: Secondary | ICD-10-CM | POA: Diagnosis not present

## 2023-09-07 DIAGNOSIS — R5381 Other malaise: Secondary | ICD-10-CM | POA: Diagnosis not present

## 2023-09-07 DIAGNOSIS — E039 Hypothyroidism, unspecified: Secondary | ICD-10-CM | POA: Diagnosis not present

## 2023-09-07 DIAGNOSIS — F5101 Primary insomnia: Secondary | ICD-10-CM | POA: Diagnosis not present

## 2023-09-07 DIAGNOSIS — K5901 Slow transit constipation: Secondary | ICD-10-CM | POA: Diagnosis not present

## 2023-09-07 DIAGNOSIS — K219 Gastro-esophageal reflux disease without esophagitis: Secondary | ICD-10-CM | POA: Diagnosis not present

## 2023-09-07 DIAGNOSIS — E162 Hypoglycemia, unspecified: Secondary | ICD-10-CM | POA: Diagnosis not present

## 2023-09-07 DIAGNOSIS — N3000 Acute cystitis without hematuria: Secondary | ICD-10-CM | POA: Diagnosis not present

## 2023-09-07 DIAGNOSIS — R5383 Other fatigue: Secondary | ICD-10-CM | POA: Diagnosis not present

## 2023-09-07 DIAGNOSIS — E538 Deficiency of other specified B group vitamins: Secondary | ICD-10-CM | POA: Diagnosis not present

## 2023-10-02 ENCOUNTER — Other Ambulatory Visit: Payer: Self-pay

## 2023-10-02 ENCOUNTER — Emergency Department (HOSPITAL_COMMUNITY)

## 2023-10-02 ENCOUNTER — Encounter (HOSPITAL_COMMUNITY): Payer: Self-pay | Admitting: Emergency Medicine

## 2023-10-02 ENCOUNTER — Inpatient Hospital Stay (HOSPITAL_COMMUNITY)
Admission: EM | Admit: 2023-10-02 | Discharge: 2023-10-06 | DRG: 872 | Disposition: A | Discharge status: Home Health | Attending: Internal Medicine | Admitting: Internal Medicine

## 2023-10-02 DIAGNOSIS — Z885 Allergy status to narcotic agent status: Secondary | ICD-10-CM

## 2023-10-02 DIAGNOSIS — E890 Postprocedural hypothyroidism: Secondary | ICD-10-CM | POA: Diagnosis not present

## 2023-10-02 DIAGNOSIS — D869 Sarcoidosis, unspecified: Secondary | ICD-10-CM | POA: Diagnosis present

## 2023-10-02 DIAGNOSIS — F321 Major depressive disorder, single episode, moderate: Secondary | ICD-10-CM | POA: Diagnosis not present

## 2023-10-02 DIAGNOSIS — Z90710 Acquired absence of both cervix and uterus: Secondary | ICD-10-CM | POA: Diagnosis not present

## 2023-10-02 DIAGNOSIS — Z881 Allergy status to other antibiotic agents status: Secondary | ICD-10-CM | POA: Diagnosis not present

## 2023-10-02 DIAGNOSIS — E876 Hypokalemia: Secondary | ICD-10-CM | POA: Diagnosis present

## 2023-10-02 DIAGNOSIS — Z981 Arthrodesis status: Secondary | ICD-10-CM

## 2023-10-02 DIAGNOSIS — A4151 Sepsis due to Escherichia coli [E. coli]: Principal | ICD-10-CM | POA: Diagnosis present

## 2023-10-02 DIAGNOSIS — Z9884 Bariatric surgery status: Secondary | ICD-10-CM

## 2023-10-02 DIAGNOSIS — Z79899 Other long term (current) drug therapy: Secondary | ICD-10-CM | POA: Diagnosis not present

## 2023-10-02 DIAGNOSIS — Z8261 Family history of arthritis: Secondary | ICD-10-CM

## 2023-10-02 DIAGNOSIS — N200 Calculus of kidney: Secondary | ICD-10-CM | POA: Diagnosis not present

## 2023-10-02 DIAGNOSIS — N390 Urinary tract infection, site not specified: Secondary | ICD-10-CM | POA: Diagnosis not present

## 2023-10-02 DIAGNOSIS — B9620 Unspecified Escherichia coli [E. coli] as the cause of diseases classified elsewhere: Secondary | ICD-10-CM | POA: Diagnosis not present

## 2023-10-02 DIAGNOSIS — R739 Hyperglycemia, unspecified: Secondary | ICD-10-CM | POA: Diagnosis present

## 2023-10-02 DIAGNOSIS — K5900 Constipation, unspecified: Secondary | ICD-10-CM | POA: Diagnosis not present

## 2023-10-02 DIAGNOSIS — R9431 Abnormal electrocardiogram [ECG] [EKG]: Secondary | ICD-10-CM | POA: Diagnosis not present

## 2023-10-02 DIAGNOSIS — Z7985 Long-term (current) use of injectable non-insulin antidiabetic drugs: Secondary | ICD-10-CM

## 2023-10-02 DIAGNOSIS — Z1612 Extended spectrum beta lactamase (ESBL) resistance: Secondary | ICD-10-CM | POA: Diagnosis present

## 2023-10-02 DIAGNOSIS — Z0389 Encounter for observation for other suspected diseases and conditions ruled out: Secondary | ICD-10-CM | POA: Diagnosis not present

## 2023-10-02 DIAGNOSIS — Z87440 Personal history of urinary (tract) infections: Secondary | ICD-10-CM

## 2023-10-02 DIAGNOSIS — E860 Dehydration: Secondary | ICD-10-CM

## 2023-10-02 DIAGNOSIS — Z882 Allergy status to sulfonamides status: Secondary | ICD-10-CM

## 2023-10-02 DIAGNOSIS — Z85828 Personal history of other malignant neoplasm of skin: Secondary | ICD-10-CM | POA: Diagnosis not present

## 2023-10-02 DIAGNOSIS — Z1152 Encounter for screening for COVID-19: Secondary | ICD-10-CM | POA: Diagnosis not present

## 2023-10-02 DIAGNOSIS — N12 Tubulo-interstitial nephritis, not specified as acute or chronic: Secondary | ICD-10-CM | POA: Diagnosis not present

## 2023-10-02 DIAGNOSIS — Z87442 Personal history of urinary calculi: Secondary | ICD-10-CM

## 2023-10-02 DIAGNOSIS — Z79890 Hormone replacement therapy (postmenopausal): Secondary | ICD-10-CM | POA: Diagnosis not present

## 2023-10-02 DIAGNOSIS — Q438 Other specified congenital malformations of intestine: Secondary | ICD-10-CM | POA: Diagnosis not present

## 2023-10-02 DIAGNOSIS — K219 Gastro-esophageal reflux disease without esophagitis: Secondary | ICD-10-CM | POA: Diagnosis present

## 2023-10-02 DIAGNOSIS — E441 Mild protein-calorie malnutrition: Secondary | ICD-10-CM | POA: Diagnosis not present

## 2023-10-02 DIAGNOSIS — R109 Unspecified abdominal pain: Secondary | ICD-10-CM | POA: Diagnosis not present

## 2023-10-02 DIAGNOSIS — F419 Anxiety disorder, unspecified: Secondary | ICD-10-CM | POA: Diagnosis not present

## 2023-10-02 DIAGNOSIS — Z8249 Family history of ischemic heart disease and other diseases of the circulatory system: Secondary | ICD-10-CM

## 2023-10-02 DIAGNOSIS — R42 Dizziness and giddiness: Secondary | ICD-10-CM | POA: Diagnosis not present

## 2023-10-02 LAB — COMPREHENSIVE METABOLIC PANEL WITH GFR
ALT: 13 U/L (ref 0–44)
AST: 19 U/L (ref 15–41)
Albumin: 3.4 g/dL — ABNORMAL LOW (ref 3.5–5.0)
Alkaline Phosphatase: 47 U/L (ref 38–126)
Anion gap: 11 (ref 5–15)
BUN: 16 mg/dL (ref 6–20)
CO2: 23 mmol/L (ref 22–32)
Calcium: 8.4 mg/dL — ABNORMAL LOW (ref 8.9–10.3)
Chloride: 101 mmol/L (ref 98–111)
Creatinine, Ser: 0.87 mg/dL (ref 0.44–1.00)
GFR, Estimated: >60 mL/min (ref >60)
Glucose, Bld: 104 mg/dL — ABNORMAL HIGH (ref 70–99)
Potassium: 3.2 mmol/L — ABNORMAL LOW (ref 3.5–5.1)
Sodium: 135 mmol/L (ref 135–145)
Total Bilirubin: 1 mg/dL (ref 0.0–1.2)
Total Protein: 7.1 g/dL (ref 6.5–8.1)

## 2023-10-02 LAB — CBC WITH DIFFERENTIAL/PLATELET
Abs Immature Granulocytes: 0.1 10*3/uL — ABNORMAL HIGH (ref 0.00–0.07)
Basophils Absolute: 0 10*3/uL (ref 0.0–0.1)
Basophils Relative: 0 %
Eosinophils Absolute: 0 10*3/uL (ref 0.0–0.5)
Eosinophils Relative: 0 %
HCT: 37.7 % (ref 36.0–46.0)
Hemoglobin: 12.3 g/dL (ref 12.0–15.0)
Immature Granulocytes: 1 %
Lymphocytes Relative: 4 %
Lymphs Abs: 0.6 10*3/uL — ABNORMAL LOW (ref 0.7–4.0)
MCH: 29.3 pg (ref 26.0–34.0)
MCHC: 32.6 g/dL (ref 30.0–36.0)
MCV: 89.8 fL (ref 80.0–100.0)
Monocytes Absolute: 1.5 10*3/uL — ABNORMAL HIGH (ref 0.1–1.0)
Monocytes Relative: 11 %
Neutro Abs: 11.9 10*3/uL — ABNORMAL HIGH (ref 1.7–7.7)
Neutrophils Relative %: 84 %
Platelets: 237 10*3/uL (ref 150–400)
RBC: 4.2 MIL/uL (ref 3.87–5.11)
RDW: 13.2 % (ref 11.5–15.5)
WBC: 14.1 10*3/uL — ABNORMAL HIGH (ref 4.0–10.5)
nRBC: 0 % (ref 0.0–0.2)

## 2023-10-02 LAB — I-STAT CG4 LACTIC ACID, ED: Lactic Acid, Venous: 0.8 mmol/L (ref 0.5–1.9)

## 2023-10-02 LAB — CK: Total CK: 92 U/L (ref 38–234)

## 2023-10-02 LAB — URINALYSIS, W/ REFLEX TO CULTURE (INFECTION SUSPECTED)
Bilirubin Urine: NEGATIVE
Glucose, UA: NEGATIVE mg/dL
Ketones, ur: 5 mg/dL — AB
Nitrite: NEGATIVE
Protein, ur: 30 mg/dL — AB
Specific Gravity, Urine: 1.012 (ref 1.005–1.030)
WBC, UA: >50 WBC/hpf (ref 0–5)
pH: 5 (ref 5.0–8.0)

## 2023-10-02 LAB — PHOSPHORUS: Phosphorus: 3.6 mg/dL (ref 2.5–4.6)

## 2023-10-02 LAB — RESP PANEL BY RT-PCR (RSV, FLU A&B, COVID)  RVPGX2
Influenza A by PCR: NEGATIVE
Influenza B by PCR: NEGATIVE
Resp Syncytial Virus by PCR: NEGATIVE
SARS Coronavirus 2 by RT PCR: NEGATIVE

## 2023-10-02 LAB — MAGNESIUM: Magnesium: 1.5 mg/dL — ABNORMAL LOW (ref 1.7–2.4)

## 2023-10-02 MED ORDER — SODIUM CHLORIDE 0.9 % IV SOLN: 1.0000 g | INTRAVENOUS | Status: DC

## 2023-10-02 MED ORDER — SODIUM CHLORIDE 0.9 % IV SOLN
1.0000 g | Freq: Once | INTRAVENOUS | Status: AC
  Administered 2023-10-02: 1 g via INTRAVENOUS
  Filled 2023-10-02: qty 10

## 2023-10-02 MED ORDER — BUPROPION HCL ER (XL) 300 MG PO TB24
300.0000 mg | ORAL_TABLET | Freq: Every day | ORAL | Status: DC
  Administered 2023-10-02 – 2023-10-06 (×5): 300 mg via ORAL
  Filled 2023-10-02 (×5): qty 1

## 2023-10-02 MED ORDER — LACTATED RINGERS IV SOLN: INTRAVENOUS | Status: DC

## 2023-10-02 MED ORDER — KETOROLAC TROMETHAMINE 15 MG/ML IJ SOLN
15.0000 mg | Freq: Once | INTRAMUSCULAR | Status: AC
  Administered 2023-10-02: 15 mg via INTRAVENOUS
  Filled 2023-10-02: qty 1

## 2023-10-02 MED ORDER — ONDANSETRON HCL 4 MG/2ML IJ SOLN: 4.0000 mg | Freq: Four times a day (QID) | INTRAMUSCULAR | Status: DC | PRN

## 2023-10-02 MED ORDER — TRAZODONE HCL 50 MG PO TABS
50.0000 mg | ORAL_TABLET | Freq: Every day | ORAL | Status: DC
  Administered 2023-10-02 – 2023-10-05 (×4): 50 mg via ORAL
  Filled 2023-10-02 (×4): qty 1

## 2023-10-02 MED ORDER — KETOROLAC TROMETHAMINE 15 MG/ML IJ SOLN: 15.0000 mg | Freq: Once | INTRAMUSCULAR | Status: DC

## 2023-10-02 MED ORDER — LACTATED RINGERS IV SOLN: INTRAVENOUS | Status: AC

## 2023-10-02 MED ORDER — POTASSIUM CHLORIDE CRYS ER 20 MEQ PO TBCR
40.0000 meq | EXTENDED_RELEASE_TABLET | Freq: Once | ORAL | Status: AC
  Administered 2023-10-02: 40 meq via ORAL
  Filled 2023-10-02: qty 2

## 2023-10-02 MED ORDER — ACETAMINOPHEN 325 MG PO TABS
650.0000 mg | ORAL_TABLET | Freq: Four times a day (QID) | ORAL | Status: DC | PRN
  Administered 2023-10-02 – 2023-10-06 (×2): 650 mg via ORAL
  Filled 2023-10-02 (×2): qty 2

## 2023-10-02 MED ORDER — ONDANSETRON HCL 4 MG PO TABS: 4.0000 mg | ORAL_TABLET | Freq: Four times a day (QID) | ORAL | Status: DC | PRN

## 2023-10-02 MED ORDER — VENLAFAXINE HCL ER 150 MG PO CP24
150.0000 mg | ORAL_CAPSULE | Freq: Every day | ORAL | Status: DC
  Administered 2023-10-02 – 2023-10-06 (×5): 150 mg via ORAL
  Filled 2023-10-02 (×5): qty 1

## 2023-10-02 MED ORDER — ACETAMINOPHEN 650 MG RE SUPP: 650.0000 mg | Freq: Four times a day (QID) | RECTAL | Status: DC | PRN

## 2023-10-02 MED ORDER — MAGNESIUM SULFATE 2 GM/50ML IV SOLN
2.0000 g | Freq: Once | INTRAVENOUS | Status: AC
  Administered 2023-10-02: 2 g via INTRAVENOUS
  Filled 2023-10-02: qty 50

## 2023-10-02 MED ORDER — CARMEX CLASSIC LIP BALM EX OINT: TOPICAL_OINTMENT | CUTANEOUS | Status: DC | PRN

## 2023-10-02 MED ORDER — SODIUM CHLORIDE 0.9 % IV BOLUS (SEPSIS)
1000.0000 mL | Freq: Once | INTRAVENOUS | Status: AC
  Administered 2023-10-02: 1000 mL via INTRAVENOUS

## 2023-10-02 MED ORDER — ENOXAPARIN SODIUM 40 MG/0.4ML IJ SOSY
40.0000 mg | PREFILLED_SYRINGE | INTRAMUSCULAR | Status: DC
  Administered 2023-10-02 – 2023-10-05 (×4): 40 mg via SUBCUTANEOUS
  Filled 2023-10-02 (×4): qty 0.4

## 2023-10-02 MED ORDER — POTASSIUM CHLORIDE CRYS ER 20 MEQ PO TBCR
40.0000 meq | EXTENDED_RELEASE_TABLET | Freq: Every day | ORAL | Status: AC
  Administered 2023-10-02: 40 meq via ORAL
  Filled 2023-10-02: qty 2

## 2023-10-02 MED ORDER — PROCHLORPERAZINE EDISYLATE 10 MG/2ML IJ SOLN
5.0000 mg | INTRAMUSCULAR | Status: DC | PRN
  Administered 2023-10-06: 5 mg via INTRAVENOUS
  Filled 2023-10-02: qty 2

## 2023-10-02 MED ORDER — LORAZEPAM 0.5 MG PO TABS
0.5000 mg | ORAL_TABLET | Freq: Three times a day (TID) | ORAL | Status: DC | PRN
  Administered 2023-10-02 – 2023-10-05 (×4): 0.5 mg via ORAL
  Filled 2023-10-02 (×5): qty 1

## 2023-10-02 MED ORDER — LACTATED RINGERS IV BOLUS (SEPSIS)
1000.0000 mL | Freq: Once | INTRAVENOUS | Status: AC
  Administered 2023-10-02: 1000 mL via INTRAVENOUS

## 2023-10-02 MED ORDER — PANTOPRAZOLE SODIUM 40 MG PO TBEC
40.0000 mg | DELAYED_RELEASE_TABLET | Freq: Every day | ORAL | Status: DC
  Administered 2023-10-02 – 2023-10-06 (×5): 40 mg via ORAL
  Filled 2023-10-02 (×6): qty 1

## 2023-10-02 MED ORDER — KETOROLAC TROMETHAMINE 15 MG/ML IJ SOLN
15.0000 mg | Freq: Four times a day (QID) | INTRAMUSCULAR | Status: AC
Start: 2023-10-02 — End: 2023-10-02
  Administered 2023-10-02 (×3): 15 mg via INTRAVENOUS
  Filled 2023-10-02 (×3): qty 1

## 2023-10-02 MED ORDER — LEVOTHYROXINE SODIUM 112 MCG PO TABS
112.0000 ug | ORAL_TABLET | Freq: Every day | ORAL | Status: DC
  Administered 2023-10-03: 112 ug via ORAL
  Filled 2023-10-02: qty 1

## 2023-10-02 NOTE — ED Provider Notes (Signed)
 [place] EMERGENCY DEPARTMENT AT [facility] Provider Note   CSN: [id number] Arrival date & time: 10/02/23  0425     History  Chief Complaint  Patient presents with   Dizziness   Headache   Back Pain    Tammy Wilson is a 58 y.o. female.  The history is provided by the patient.   Patient with history of anxiety, hypothyroid presents with multiple complaints Patient reports around 2 days ago she started having generalized weakness.  The following day she worked outside in the yard and started feeling increasing weakness.  She is felt like she has had fevers.  She reports mild headache and low back pain.  No vomiting or diarrhea.  No vertigo, but feels very weak.  No syncope. She was just started on Cipro for a UTI.  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 Authorizing Provider  buPROPion (WELLBUTRIN XL) 300 MG 24 hr tablet Take 300 mg by mouth daily. 08/06/20  Yes [provider]  ciprofloxacin (CIPRO) 500 MG tablet Take 500 mg by mouth 2 (two) times daily. 09/29/23 10/04/23 Yes [provider]  Cyanocobalamin (VITAMIN B-12 PO) Take 1 tablet by mouth daily.   Yes [provider]  fluconazole (DIFLUCAN) 150 MG tablet Take 150 mg by mouth once. 09/07/23  Yes [provider]  levothyroxine (SYNTHROID) 112 MCG tablet Take 112 mcg  by mouth every evening. 06/24/20  Yes [provider]  LORazepam (ATIVAN) 0.5 MG tablet Take 0.5 mg by mouth every 8 (eight) hours as needed for anxiety. 09/09/21  Yes [provider]  lubiprostone (AMITIZA) 24 MCG capsule Take 24 mcg by mouth 2 (two) times daily. 09/29/23 10/29/23 Yes [provider]  ondansetron (ZOFRAN-ODT) 4 MG disintegrating tablet Take 1 tablet (4 mg total) by mouth every 6 (six) hours as needed for nausea or vomiting. 09/09/20  Yes Berna Bue, MD  pantoprazole (PROTONIX) 40 MG tablet Take 1 tablet (40 mg total) by mouth daily. 02/02/23  Yes Pyrtle, Carie Caddy, MD  tirzepatide [facility]) 7.5 MG/0.5ML Pen Inject 7.5 mg into the skin once a week. 05/12/22  Yes [provider]  traZODone (DESYREL) 50 MG tablet Take 50 mg by mouth at bedtime.   Yes [provider]  venlafaxine XR (EFFEXOR-XR) 150 MG 24 hr capsule Take 150 mg by mouth daily. with food 08/06/20  Yes [provider]  fluticasone-salmeterol (WIXELA INHUB) 250-50 MCG/ACT AEPB Inhale 1 puff into the lungs in the morning and at bedtime. Patient not taking: Reported on 05/10/2023 04/27/23   Tomma Lightning, MD  LORazepam (ATIVAN) 1 MG tablet Take 1 mg by mouth 3 (three) times daily as needed for anxiety. Patient not taking: Reported on 10/02/2023 08/07/20   [provider]  TRULANCE 3 MG TABS Take 1  tablet by mouth daily. Patient not taking: Reported on 10/02/2023 09/07/23   [provider]  Venlafaxine HCl 225 MG TB24 Take by mouth. Patient not taking: Reported on 10/02/2023 03/22/22   [provider]      Allergies    Sulfa antibiotics, Dilaudid [hydromorphone hcl], and Sulfamethoxazole-trimethoprim    Review of Systems   Review of Systems  Constitutional:  Positive for fever.  Respiratory:  Negative for cough and shortness of breath.   Cardiovascular:  Negative for chest pain.  Gastrointestinal:  Negative for diarrhea and vomiting.   Genitourinary:  Negative for dysuria.  Neurological:  Positive for weakness, light-headedness and headaches.    Physical Exam Updated Vital Signs BP 107/69 (BP Location: Left Arm)   Pulse (!) 106   Temp 98.3 F (36.8 C) (Oral)   Resp 17   Ht 1.702 m (5\' 7" )   Wt 68 kg   SpO2 100%   BMI 23.49 kg/m  Physical Exam CONSTITUTIONAL: Ill-appearing, patient smells of ketones HEAD: Normocephalic/atraumatic EYES: EOMI/PERRL, no nystagmus ENMT: Mucous membranes dry NECK: supple no meningeal signs SPINE/BACK:entire spine nontender CV: S1/S2 noted, tachycardic LUNGS: Lungs are clear to auscultation bilaterally, no apparent distress ABDOMEN: soft, nontender GU:no cva tenderness NEURO: Pt is awake/alert/appropriate, moves all extremitiesx4.  No facial droop.  No arm or leg drift EXTREMITIES: pulses normal/equal, full ROM SKIN: warm, color normal  ED Results / Procedures / Treatments   Labs (all labs ordered are listed, but only abnormal results are displayed) Labs Reviewed  CBC WITH DIFFERENTIAL/PLATELET - Abnormal; Notable for the following components:      Result Value   WBC 14.1 (*)    Neutro Abs 11.9 (*)    Lymphs Abs 0.6 (*)    Monocytes Absolute 1.5 (*)    Abs Immature Granulocytes 0.10 (*)    All other components within normal limits  COMPREHENSIVE METABOLIC PANEL WITH GFR - Abnormal; Notable for the following components:   Potassium 3.2 (*)    Glucose, Bld 104 (*)    Calcium 8.4 (*)    Albumin 3.4 (*)    All other components within normal limits  URINALYSIS, W/ REFLEX TO CULTURE (INFECTION SUSPECTED) - Abnormal; Notable for the following components:   APPearance HAZY (*)    Hgb urine dipstick SMALL (*)    Ketones, ur 5 (*)    Protein, ur 30 (*)    Leukocytes,Ua LARGE (*)    Bacteria, UA RARE (*)    All other components within normal limits  RESP PANEL BY RT-PCR (RSV, FLU A&B, COVID)  RVPGX2  CULTURE, BLOOD (ROUTINE X 2)  CULTURE, BLOOD (ROUTINE X 2)  URINE  CULTURE  CK  I-STAT CG4 LACTIC ACID, ED    EKG EKG Interpretation Date/Time:  Sunday October 02 2023 04:50:39 EDT Ventricular Rate:  97 PR Interval:  153 QRS Duration:  100 QT Interval:  405 QTC Calculation: 515 R Axis:   90  Text Interpretation: Sinus rhythm Borderline right axis deviation Posterior infarct, old Borderline ST depression, lateral leads Prolonged QT interval Baseline wander in lead(s) V6 Confirmed by Zadie Rhine ([id number]) on 10/02/2023 5:00:54 AM  Radiology CT Renal Stone Study Result Date: 10/02/2023 CLINICAL DATA:  58 year old female with abdomen and flank pain. History of kidney stones. EXAM: CT ABDOMEN AND PELVIS WITHOUT CONTRAST TECHNIQUE: Multidetector CT imaging of the abdomen and pelvis was performed following the standard protocol without IV contrast. RADIATION DOSE REDUCTION: This exam was performed according to the departmental dose-optimization program which  includes automated exposure control, adjustment of the mA and/or kV according to patient size and/or use of iterative reconstruction technique. COMPARISON:  CT Abdomen and Pelvis 06/28/2023. FINDINGS: Lower chest: Heart size remains normal.  Negative lung bases. Hepatobiliary: Negative noncontrast liver and gallbladder. Pancreas: Negative. Spleen: Negative. Adrenals/Urinary Tract: Negative adrenal glands. Noncontrast right kidney appears stable since December. No right nephrolithiasis. Prominent right renal pelvis but decompressed right ureter which seems to remain diminutive to the bladder. Right pelvic phleboliths appear stable. Increased left renal pelvis size. Small 3 mm left lower pole calculus now. Left periureteral stranding, but no left hydroureter. And the distal left ureter in the pelvis seems to remain normal. No convincing ureteral calculus. Left hemipelvis phleboliths are stable. Negative urinary bladder, no stone within the bladder. Stomach/Bowel: Redundant sigmoid colon at the pelvic inlet with retained  gas and stool. Diverticulosis in the upstream descending colon, no definite active inflammation there. Negative transverse and right colon. Normal gas containing appendix series 2, image 60. Nondilated small bowel. Chronic gastric bypass. Downstream small bowel anastomosis in the left abdomen series 2, image 30 appears stable. No pneumoperitoneum, free fluid, convincing mesenteric inflammation. Vascular/Lymphatic: Normal caliber abdominal aorta. Mild Aortoiliac calcified atherosclerosis. No lymphadenopathy identified. Reproductive: Surgically absent uterus. Diminutive or absent ovaries. Other: No pelvis free fluid. Musculoskeletal: Lumbar disc and endplate degeneration with occasional vacuum disc. No acute osseous abnormality identified. IMPRESSION: 1. Persistent left nephrolithiasis, both renal pelves slightly larger since December, and mild left periureteral stranding. But no urinary calculus identified. Query recently passed stone. However, UTI or urinary infection not excluded. 2. No other acute or inflammatory process identified in the noncontrast abdomen or pelvis. 3. 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 Electronically Signed   By: Odessa Fleming M.D.   On: 10/02/2023 06:42   DG Chest Port 1 View Result Date: 10/02/2023 CLINICAL DATA:  58 year old female with possible sepsis. EXAM: PORTABLE CHEST 1 VIEW COMPARISON:  Chest CT 05/30/2023 and earlier. FINDINGS: Portable AP semi upright view at 0532 hours. Low normal lung volumes. Normal cardiac size and mediastinal contours. Visualized tracheal air column is within normal limits. Allowing for portable technique the lungs are clear. No pneumothorax or pleural effusion. Chronic cervical ACDF and thyroidectomy. Negative visible bowel gas. No acute osseous abnormality identified. IMPRESSION: No acute cardiopulmonary abnormality. Electronically Signed   By: Odessa Fleming M.D.   On: 10/02/2023 06:17    Procedures .Critical Care  Performed by: Zadie Rhine,  MD Authorized by: Zadie Rhine, MD   Critical care provider statement:    Critical care time (minutes):  60   Critical care start time:  10/02/2023 6:15 AM   Critical care end time:  10/02/2023 7:15 AM   Critical care time was exclusive of:  Separately billable procedures and treating other patients   Critical care was necessary to treat or prevent imminent or life-threatening deterioration of the following conditions:  Sepsis, shock and renal failure   Critical care was time spent personally by me on the following activities:  Examination of patient, evaluation of patient's response to treatment, re-evaluation of patient's condition, ordering and review of laboratory studies, development of treatment plan with patient or surrogate, pulse oximetry, ordering and review of radiographic studies and obtaining history from patient or surrogate   I assumed direction of critical care for this patient from another provider in my specialty: no     Care discussed with: admitting provider   Comments:     Patient initially hypotensive, concern for sepsis,  given IV fluids and antibiotics.     Medications Ordered in ED Medications  cefTRIAXone (ROCEPHIN) 1 g in sodium chloride 0.9 % 100 mL IVPB (1 g Intravenous New Bag/Given 10/02/23 0710)  lactated ringers bolus 1,000 mL (1,000 mLs Intravenous Bolus 10/02/23 0533)  ketorolac (TORADOL) 15 MG/ML injection 15 mg (15 mg Intravenous Given 10/02/23 0548)  sodium chloride 0.9 % bolus 1,000 mL (0 mLs Intravenous Stopped 10/02/23 0710)    ED Course/ Medical Decision Making/ A&P Clinical Course as of 10/02/23 0720  Sun Oct 02, 2023  0514 Patient is ill-appearing.  She appears dehydrated, and had hypotension with just movement in the bed.  Reports feeling ill over the past 1-2 days and then worked outside in the heat and thought this exacerbated her symptoms.  It is unclear if this is heat exhaustion versus sepsis.  Patient did just take Tylenol prior to arrival which  could be masking a fever Reports recent recurrent UTI and just started Cipro. Extensive workup has been initiated [DW]  0548 Patient reports feeling slightly improved.  Reports that his back pain feels similar to previous UTIs.  She reports long history of kidney stones.  Will obtain CT renal study to evaluate for ureteral stone [DW]  0652 WBC(!): 14.1 Leukocytosis [DW]  0652 Leukocytes,Ua(!): LARGE UTI [DW]  2130 Patient without obstructing ureteral stone.  However does have evidence of UTI.  She still feels weak. Given her initial hypotension and generalized weakness, will admit for IV fluids and antibiotics.  Fortunately her lactate is negative.  Discussed with Dr. Robb Matar for admission [DW]    Clinical Course User Index [DW] Zadie Rhine, MD                                 Medical Decision Making Amount and/or Complexity of Data Reviewed Labs: ordered. Decision-making details documented in ED Course. Radiology: ordered. ECG/medicine tests: ordered.  Risk Prescription drug management. Decision regarding hospitalization.   This patient presents to the ED for concern of weakness, this involves an extensive number of treatment options, and is a complaint that carries with it a high risk of complications and morbidity.  The differential diagnosis includes but is not limited to CVA, intracranial hemorrhage, acute coronary syndrome, renal failure, urinary tract infection, electrolyte disturbance, pneumonia Sepsis, rhabdomyolysis  Comorbidities that complicate the patient evaluation: Patient's presentation is complicated by their history of hypothyroid  Additional history obtained: Additional history obtained from family Records reviewed  outpatient records reviewed  Lab Tests: I Ordered, and personally interpreted labs.  The pertinent results include: Leukocytosis  Imaging Studies ordered: I ordered imaging studies including X-ray chest   I independently visualized and  interpreted imaging which showed no acute findings I agree with the radiologist interpretation  Cardiac Monitoring: The patient was maintained on a cardiac monitor.  I personally viewed and interpreted the cardiac monitor which showed an underlying rhythm of:  sinus rhythm  Medicines ordered and prescription drug management: I ordered medication including IV fluids for dehydration Reevaluation of the patient after these medicines showed that the patient    improved   Critical Interventions:   IV fluids and antibiotics  Consultations Obtained: I requested consultation with the admitting physician [facility] , and discussed  findings as well as pertinent plan - they recommend: Admit  Reevaluation: After the interventions noted above, I reevaluated the patient and found that they have :improved  Complexity of problems addressed: Patient's  presentation is most consistent with  acute presentation with potential threat to life or bodily function  Disposition: After consideration of the diagnostic results and the patient's response to treatment,  I feel that the patent would benefit from admission   .           Final Clinical Impression(s) / ED Diagnoses Final diagnoses:  Pyelonephritis  Dehydration    Rx / DC Orders ED Discharge Orders     None         Zadie Rhine, MD 10/02/23 [phone]

## 2023-10-02 NOTE — ED Triage Notes (Signed)
 Pt to ED from home c/o dizziness, back pain, and headache since Friday.  States was working outside in the heat when symptoms started.  Pt states was dx with UTI and started first dose abx last night.

## 2023-10-02 NOTE — H&P (Signed)
 History and Physical    Patient: Tammy Wilson UEA:[id number] DOB: 03/16/1966 DOA: 10/02/2023 DOS: the patient was seen and examined on 10/02/2023 PCP: Audie Pinto, FNP  Patient coming from: Home  Chief Complaint:  Chief Complaint  Patient presents with   Dizziness   Headache   Back Pain   HPI: Tammy Wilson is a 58 y.o. female with medical history significant of anxiety, depression, B12 deficiency, history of obesity, history of gastric bypass surgery, skin cancer, GERD, history of nephrolithiasis and bilateral hydronephrosis, hypothyroidism, sarcoidosis, pulmonary nodules who presented to the emergency department complaints of dizziness, headache, mild dysuria and left flank pain concerning her for hideous oxygen after she spent significant portion of the day working outside in her yard.  She was recently started on ciprofloxacin for UTI, but has only taken 1 dose. No chest pain, palpitations, diaphoresis, PND, orthopnea or pitting edema of the lower extremities. No emesis, diarrhea, constipation, melena or hematochezia. No polyuria, polydipsia, polyphagia or blurred vision.   Lab work: Urinalysis was hazy with small hemoglobin and large leukocyte esterase.  Ketones of 5 and protein of 30 mg/dL.  Urine microscopic examination shows 0-5 RBC, greater than 50 WBC and rare bacteria.  CBC showed a white count of 14.1, hemoglobin 12.3 g/dL platelets 478.  Lactic acid and total CK were normal.  Coronavirus, influenza and RSV PCR test was negative.  CMP showed a potassium of 3.2 mmol/L, glucose of 104 mg deciliter and albumin was 3.4 g/dL, the rest of the CMP measurements were normal.  Imaging: Portable 1 view chest radiograph with no acute cardiopulmonary abnormality.  CT abdomen/pelvis without contrast showing persistent left nephrolithiasis, both renal pelvis slightly larger since December, and mild left periureteral stranding, but no urinary calculus identified.  No other acute or  inflammatory process identified.  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   ED course: Initial vital signs were temperature 98.3 F, pulse 96, respirations 17, BP 107/69 mmHg O2 sat 100% on room air.  The patient received LR 1000 mL bolus, NS 1000 mL liter bolus, ketorolac 15 mg IVP and ceftriaxone 1 g IVPB.  Review of Systems: As mentioned in the history of present illness. All other systems reviewed and are negative.  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   bladder tack     GASTRIC ROUX-EN-Y N/A 09/08/2020   Procedure: LAPAROSCOPIC ROUX-EN-Y GASTRIC BYPASS WITH UPPER ENDOSCOPY, CONVERSION FROM LAPAROSCOPIC SLEEVE GASTECTOMY;  Surgeon: Luretha Murphy, MD;  Location: [facility];  Service: General;  Laterality: N/A;  3.5 HOURS TOTAL PLEASE   HIATAL HERNIA REPAIR N/A 09/08/2020   Procedure: HERNIA REPAIR HIATAL;  Surgeon: Luretha Murphy, MD;  Location: [facility];  Service: General;  Laterality: N/A;   LAPAROSCOPIC GASTRIC SLEEVE RESECTION N/A 06/09/2015   Procedure: LAPAROSCOPIC GASTRIC SLEEVE RESECTION;  Surgeon: Luretha Murphy, MD;  Location: [facility];  Service: General;  Laterality:  N/A;   NECK SURGERY     Cervial fusion '12-[facility] - Dr. Jordan Likes   SINUSOTOMY     THYROIDECTOMY  2019   TUBAL LIGATION     UPPER GI ENDOSCOPY N/A 06/09/2015   Procedure: UPPER GI ENDOSCOPY;  Surgeon: Luretha Murphy, MD;  Location: [facility];  Service: General;  Laterality: N/A;   UPPER GI ENDOSCOPY  N/A 09/08/2020   Procedure: UPPER GI ENDOSCOPY;  Surgeon: Luretha Murphy, MD;  Location: [facility];  Service: General;  Laterality: N/A;   Social History:  reports that she has never smoked. She has never used smokeless tobacco. She reports current alcohol use. She reports that she does not use drugs.  Allergies  Allergen Reactions   Sulfa Antibiotics Nausea And Vomiting   Dilaudid [Hydromorphone Hcl] Palpitations   Sulfamethoxazole-Trimethoprim Rash    Nausea and vomitting    Family History  Problem Relation Age of Onset   Heart disease Mother    Rheum arthritis Mother    Colon cancer Neg Hx    Esophageal cancer Neg Hx    Stomach cancer Neg Hx    Rectal cancer Neg Hx     Prior to Admission medications   Medication Sig Start Date End Date Taking? Authorizing Provider  buPROPion (WELLBUTRIN XL) 300 MG 24 hr tablet Take 300 mg by mouth daily. 08/06/20  Yes [provider]  ciprofloxacin (CIPRO) 500 MG tablet Take 500 mg by mouth 2 (two) times daily. 09/29/23 10/04/23 Yes [provider]  Cyanocobalamin (VITAMIN B-12 PO) Take 1 tablet by mouth daily.   Yes [provider]  fluconazole (DIFLUCAN) 150 MG tablet Take 150 mg by mouth once. 09/07/23  Yes [provider]  levothyroxine (SYNTHROID) 112 MCG tablet Take 112 mcg by mouth every evening. 06/24/20  Yes [provider]  LORazepam (ATIVAN) 0.5 MG tablet Take 0.5 mg by mouth every 8 (eight) hours as needed for anxiety. 09/09/21  Yes [provider]  lubiprostone (AMITIZA) 24 MCG capsule Take 24 mcg by mouth 2 (two) times daily. 09/29/23 10/29/23 Yes [provider]  ondansetron (ZOFRAN-ODT) 4 MG disintegrating tablet Take 1 tablet (4 mg total) by mouth every 6 (six) hours as needed for nausea or vomiting. 09/09/20  Yes Berna Bue, MD  pantoprazole (PROTONIX) 40 MG tablet Take 1 tablet (40 mg total) by mouth daily. 02/02/23  Yes Pyrtle, Carie Caddy, MD  tirzepatide [facility]) 7.5 MG/0.5ML  Pen Inject 7.5 mg into the skin once a week. 05/12/22  Yes [provider]  traZODone (DESYREL) 50 MG tablet Take 50 mg by mouth at bedtime.   Yes [provider]  venlafaxine XR (EFFEXOR-XR) 150 MG 24 hr capsule Take 150 mg by mouth daily. with food 08/06/20  Yes [provider]  fluticasone-salmeterol (WIXELA INHUB) 250-50 MCG/ACT AEPB Inhale 1 puff into the lungs in the morning and at bedtime. Patient not taking: Reported on 05/10/2023 04/27/23   Tomma Lightning, MD  LORazepam (ATIVAN) 1 MG tablet Take 1 mg by mouth 3 (three) times daily as needed for anxiety. Patient not taking: Reported on 10/02/2023 08/07/20   [provider]  TRULANCE 3 MG TABS Take 1 tablet by mouth daily. Patient not taking: Reported on 10/02/2023 09/07/23   [provider]  Venlafaxine HCl 225 MG TB24 Take by mouth. Patient not taking: Reported on 10/02/2023 03/22/22   [provider]    Physical Exam: Vitals:   10/02/23 0430 10/02/23 0435  BP: 107/69   Pulse: Marland Kitchen)  106   Resp: 17   Temp: 98.3 F (36.8 C)   TempSrc: Oral   SpO2: 100%   Weight:  68 kg  Height:  5\' 7"  (1.702 m)   Physical Exam Vitals and nursing note reviewed.  Constitutional:      General: She is awake. She is not in acute distress.    Appearance: She is well-developed. She is ill-appearing.  HENT:     Head: Normocephalic.     Nose: No rhinorrhea.  Eyes:     General: No scleral icterus.    Pupils: Pupils are equal, round, and reactive to light.  Neck:     Vascular: No JVD.  Cardiovascular:     Rate and Rhythm: Normal rate and regular rhythm.     Heart sounds: S1 normal and S2 normal.  Pulmonary:     Effort: Pulmonary effort is normal.     Breath sounds: Normal breath sounds. No wheezing, rhonchi or rales.  Abdominal:     General: Bowel sounds are normal. There is no distension.     Palpations: Abdomen is soft.     Tenderness: There is no abdominal tenderness. There is no right CVA  tenderness or left CVA tenderness.  Musculoskeletal:     Cervical back: Neck supple.     Right lower leg: No edema.     Left lower leg: No edema.  Skin:    General: Skin is warm and dry.  Neurological:     General: No focal deficit present.     Mental Status: She is alert and oriented to person, place, and time.  Psychiatric:        Mood and Affect: Mood normal.        Behavior: Behavior normal. Behavior is cooperative.     Data Reviewed:  Results are pending, will review when available. EKG: Vent. rate 97 BPM PR interval 153 ms QRS duration 100 ms QT/QTcB 405/515 ms P-R-T axes 85 90 27 Sinus rhythm Borderline right axis deviation Posterior infarct, old Borderline ST depression, lateral leads Prolonged QT interval Baseline wander in lead(s) V6  Assessment and Plan: Principal Problem:   Pyelonephritis Admit to telemetry/inpatient. Continue IV fluids. Continue ceftriaxone 1 g every 24 hours.   Continue ketorolac 15 mg IVP every 6 hours as needed. Follow-up urine culture and sensitivity. Follow-up blood culture and sensitivity Follow CBC and CMP in a.m.  Active Problems:   Hypokalemia Replacing. Will check potassium in AM.    Hypomagnesemia Magnesium sulfate 2 g IVPB x 1. Follow magnesium level as needed.    Prolonged QT interval   Mild protein malnutrition (HCC) In the setting of acute illness. Follow-up protein level.    Hyperglycemia Check fasting glucose in AM. Further workup depending on results.    Anxiety   Moderate major depression (HCC) Continue trazodone 50 mg p.o. bedtime.   Continue lorazepam 0.5 mg p.o. every 8 hours as needed. Continue bupropion 300 mg p.o. daily. Continue on 15 mg p.o. daily.    GERD (gastroesophageal reflux disease) Antiacid, H2 blocker or PPI as needed.    Postoperative hypothyroidism Continue with thyroxine 112 mcg p.o. daily.    Gastric bypass status for obesity BMI is normal now.    Advance Care Planning:    Code Status: Full Code   Consults:   Family Communication:   Severity of Illness: The appropriate patient status for this patient is INPATIENT. Inpatient status is judged to be reasonable and necessary in order to provide the required intensity  of service to ensure the patient's safety. The patient's presenting symptoms, physical exam findings, and initial radiographic and laboratory data in the context of their chronic comorbidities is felt to place them at high risk for further clinical deterioration. Furthermore, it is not anticipated that the patient will be medically stable for discharge from the hospital within 2 midnights of admission.   * I certify that at the point of admission it is my clinical judgment that the patient will require inpatient hospital care spanning beyond 2 midnights from the point of admission due to high intensity of service, high risk for further deterioration and high frequency of surveillance required.*  Author: Bobette Mo, MD 10/02/2023 7:54 AM  For on call review [email or web address].   This document was prepared using Dragon voice recognition software and may contain some unintended transcription errors.

## 2023-10-03 ENCOUNTER — Inpatient Hospital Stay (HOSPITAL_COMMUNITY)

## 2023-10-03 DIAGNOSIS — N12 Tubulo-interstitial nephritis, not specified as acute or chronic: Secondary | ICD-10-CM | POA: Diagnosis not present

## 2023-10-03 DIAGNOSIS — R9431 Abnormal electrocardiogram [ECG] [EKG]: Secondary | ICD-10-CM | POA: Diagnosis not present

## 2023-10-03 LAB — BLOOD CULTURE ID PANEL (REFLEXED) - BCID2

## 2023-10-03 LAB — COMPREHENSIVE METABOLIC PANEL WITH GFR
ALT: 22 U/L (ref 0–44)
AST: 41 U/L (ref 15–41)
Albumin: 2.8 g/dL — ABNORMAL LOW (ref 3.5–5.0)
Alkaline Phosphatase: 56 U/L (ref 38–126)
Anion gap: 8 (ref 5–15)
BUN: 12 mg/dL (ref 6–20)
CO2: 24 mmol/L (ref 22–32)
Calcium: 8.3 mg/dL — ABNORMAL LOW (ref 8.9–10.3)
Chloride: 106 mmol/L (ref 98–111)
Creatinine, Ser: 0.62 mg/dL (ref 0.44–1.00)
GFR, Estimated: >60 mL/min (ref >60)
Glucose, Bld: 86 mg/dL (ref 70–99)
Potassium: 3.8 mmol/L (ref 3.5–5.1)
Sodium: 138 mmol/L (ref 135–145)
Total Bilirubin: 0.4 mg/dL (ref 0.0–1.2)
Total Protein: 6.2 g/dL — ABNORMAL LOW (ref 6.5–8.1)

## 2023-10-03 LAB — ECHOCARDIOGRAM COMPLETE
AR max vel: 2.52 cm2
AV Peak grad: 7.8 mmHg
Ao pk vel: 1.4 m/s
Area-P 1/2: 5.88 cm2
Height: 67 in
MV VTI: 2.7 cm2
S' Lateral: 2.7 cm
Weight: 2400 [oz_av]

## 2023-10-03 LAB — CBC
HCT: 36.3 % (ref 36.0–46.0)
Hemoglobin: 11.4 g/dL — ABNORMAL LOW (ref 12.0–15.0)
MCH: 29.3 pg (ref 26.0–34.0)
MCHC: 31.4 g/dL (ref 30.0–36.0)
MCV: 93.3 fL (ref 80.0–100.0)
Platelets: 196 10*3/uL (ref 150–400)
RBC: 3.89 MIL/uL (ref 3.87–5.11)
RDW: 13.2 % (ref 11.5–15.5)
WBC: 6.7 10*3/uL (ref 4.0–10.5)
nRBC: 0 % (ref 0.0–0.2)

## 2023-10-03 LAB — URINE CULTURE: Culture: <10000 — AB

## 2023-10-03 LAB — HIV ANTIBODY (ROUTINE TESTING W REFLEX): HIV Screen 4th Generation wRfx: NONREACTIVE

## 2023-10-03 MED ORDER — KETOROLAC TROMETHAMINE 15 MG/ML IJ SOLN
15.0000 mg | Freq: Four times a day (QID) | INTRAMUSCULAR | Status: AC
  Administered 2023-10-03 – 2023-10-06 (×11): 15 mg via INTRAVENOUS
  Filled 2023-10-03 (×11): qty 1

## 2023-10-03 MED ORDER — SODIUM CHLORIDE 0.9 % IV SOLN
1.0000 g | Freq: Three times a day (TID) | INTRAVENOUS | Status: DC
  Administered 2023-10-03 – 2023-10-05 (×7): 1 g via INTRAVENOUS
  Filled 2023-10-03 (×8): qty 20

## 2023-10-03 MED ORDER — SENNOSIDES-DOCUSATE SODIUM 8.6-50 MG PO TABS
1.0000 | ORAL_TABLET | Freq: Two times a day (BID) | ORAL | Status: DC
  Administered 2023-10-03 – 2023-10-06 (×6): 1 via ORAL
  Filled 2023-10-03 (×7): qty 1

## 2023-10-03 MED ORDER — VITAMIN B-12 100 MCG PO TABS
500.0000 ug | ORAL_TABLET | Freq: Every day | ORAL | Status: DC
  Administered 2023-10-03 – 2023-10-06 (×4): 500 ug via ORAL
  Filled 2023-10-03 (×4): qty 5

## 2023-10-03 MED ORDER — POLYETHYLENE GLYCOL 3350 17 G PO PACK
17.0000 g | PACK | Freq: Every day | ORAL | Status: DC | PRN
  Administered 2023-10-03: 17 g via ORAL
  Filled 2023-10-03: qty 1

## 2023-10-03 NOTE — Progress Notes (Signed)
 Pharmacy Brief Note - BCID Follow Up:  Previous BCID called in as ESBL+ E.coli, pt on meropenem. Received call from micro lab that second set of BCx obtained on 4/6 @ 05:11 are now positive for GNR. No BCID panel will be ran.   Notified provider Chinita Greenland, NP. No changes recommended - continue meropenem.   Cindi Carbon, PharmD 10/03/23 8:32 PM

## 2023-10-03 NOTE — Hospital Course (Addendum)
 58 y.o. female with medical history significant of anxiety, depression, B12 deficiency, history of obesity, history of gastric bypass surgery, skin cancer, GERD, history of nephrolithiasis and bilateral hydronephrosis, hypothyroidism, sarcoidosis, pulmonary nodules who presented to the ED  W/  complaints of dizziness, headache, mild dysuria and left flank pain after she spent significant portion of the day working outside in her yard.  She was recently started on ciprofloxacin for UTI, but has only taken 1 dose. In the ED afebrile vitals otherwise stable. Labs concerning for UTI with leukocytosis. Chest x-ray no acute findings CT abdomen pelvis without contrast persistent left nephrolithiasis,both renal pelvis slightly larger since December, and mild left periureteral stranding, but no urinary calculus identified.  No other acute or inflammatory process identified received normal saline bolus Toradol ceftriaxone  AND admitted for pyelonephritis. With meropenem patient improved-leukocytosis resolved.  Seen by infectious disease changed to ertapenem and plan to complete 10 days course blood culture repeated 4/9 with plan to discharge on 4/10 if blood culture remains negative with midline She will need to follow-up with urology for kidney stone   Subjective: Seen and examined this morning afebrile doing well   Discharge diagnosis:   Primary problem Pyelonephritis ctx-m ESBL E. Coli sepsis POA: Patient met sepsis criteria tachycardia 106, leukocytosis and pyelonephritis on admission.  Hemodynamically stable lactic acid normal.  Seen by infectious disease changed to ertapenem and plan to complete 10 days course blood culture repeated 4/9 with plan to discharge on 4/10 if blood culture remains negative with midline. TTE looks stable.  Nephrolithiasis: Patient reports she had previous stone treatment.  CT>persistent left nephrolithiasis Advised to follow-up with urology in the light of  #1   Hypokalemia Hypomagnesemia: Resolved.  Prolonged QT interval: Monitor qtc  Mild protein malnutrition  Augment diet as tolerated   Hyperglycemia: Blood sugar stable   Anxiety Moderate major depression : Mood stable, continue trazodone lorazepam 0.5 mg p.o. every 8 hr prn, bupropion 300 mg p.o  GERD: Stable, cont on Antiacid, H2 blocker or PPI as needed.   Postoperative hypothyroidism Continue with levthyroxine 112 mcg p.o. daily.   Gastric bypass status for obesity BMI is normal now.

## 2023-10-03 NOTE — Progress Notes (Signed)
 PHARMACY - PHYSICIAN COMMUNICATION CRITICAL VALUE ALERT - BLOOD CULTURE IDENTIFICATION (BCID)  Tammy Wilson is an 58 y.o. female who presented to [facility] on 10/02/2023 with a chief complaint of  dizziness, headache, mild dysuria and left flank pain   Assessment:  1/4 Ecoli CTXM  Name of physician (or Provider) Contacted: Johann Capers  Current antibiotics: CTX  Changes to prescribed antibiotics recommended:  D/c CTX start merrem 1gm IV q8h  Results for orders placed or performed during the hospital encounter of 10/02/23  Blood Culture ID Panel (Reflexed) (Collected: 10/02/2023  5:30 AM)  Result Value Ref Range   Enterococcus faecalis NOT DETECTED NOT DETECTED   Enterococcus Faecium NOT DETECTED NOT DETECTED   Listeria monocytogenes NOT DETECTED NOT DETECTED   Staphylococcus species NOT DETECTED NOT DETECTED   Staphylococcus aureus (BCID) NOT DETECTED NOT DETECTED   Staphylococcus epidermidis NOT DETECTED NOT DETECTED   Staphylococcus lugdunensis NOT DETECTED NOT DETECTED   Streptococcus species NOT DETECTED NOT DETECTED   Streptococcus agalactiae NOT DETECTED NOT DETECTED   Streptococcus pneumoniae NOT DETECTED NOT DETECTED   Streptococcus pyogenes NOT DETECTED NOT DETECTED   A.calcoaceticus-baumannii NOT DETECTED NOT DETECTED   Bacteroides fragilis NOT DETECTED NOT DETECTED   Enterobacterales DETECTED (A) NOT DETECTED   Enterobacter cloacae complex NOT DETECTED NOT DETECTED   Escherichia coli DETECTED (A) NOT DETECTED   Klebsiella aerogenes NOT DETECTED NOT DETECTED   Klebsiella oxytoca NOT DETECTED NOT DETECTED   Klebsiella pneumoniae NOT DETECTED NOT DETECTED   Proteus species NOT DETECTED NOT DETECTED   Salmonella species NOT DETECTED NOT DETECTED   Serratia marcescens NOT DETECTED NOT DETECTED   Haemophilus influenzae NOT DETECTED NOT DETECTED   Neisseria meningitidis NOT DETECTED NOT DETECTED   Pseudomonas aeruginosa NOT DETECTED NOT DETECTED   Stenotrophomonas  maltophilia NOT DETECTED NOT DETECTED   Candida albicans NOT DETECTED NOT DETECTED   Candida auris NOT DETECTED NOT DETECTED   Candida glabrata NOT DETECTED NOT DETECTED   Candida krusei NOT DETECTED NOT DETECTED   Candida parapsilosis NOT DETECTED NOT DETECTED   Candida tropicalis NOT DETECTED NOT DETECTED   Cryptococcus neoformans/gattii NOT DETECTED NOT DETECTED   CTX-M ESBL DETECTED (A) NOT DETECTED   Carbapenem resistance IMP NOT DETECTED NOT DETECTED   Carbapenem resistance KPC NOT DETECTED NOT DETECTED   Carbapenem resistance NDM NOT DETECTED NOT DETECTED   Carbapenem resist OXA 48 LIKE NOT DETECTED NOT DETECTED   Carbapenem resistance VIM NOT DETECTED NOT DETECTED    Arley Phenix RPh 10/03/2023, 1:59 AM

## 2023-10-03 NOTE — Progress Notes (Signed)
 PROGRESS NOTE Tammy Wilson  UJW:[id number] DOB: [birth year] DOA: 10/02/2023 PCP: Audie Pinto, FNP  Brief Narrative/Hospital Course:  58 y.o. female with medical history significant of anxiety, depression, B12 deficiency, history of obesity, history of gastric bypass surgery, skin cancer, GERD, history of nephrolithiasis and bilateral hydronephrosis, hypothyroidism, sarcoidosis, pulmonary nodules who presented to the ED  W/  complaints of dizziness, headache, mild dysuria and left flank pain after she spent significant portion of the day working outside in her yard.  She was recently started on ciprofloxacin for UTI, but has only taken 1 dose.  In the ED afebrile vitals otherwise stable. Labs concerning for UTI with leukocytosis. Chest x-ray no acute findings CT abdomen pelvis without contrast persistent left nephrolithiasis,both renal pelvis slightly larger since December, and mild left periureteral stranding, but no urinary calculus identified.  No other acute or inflammatory process identified received normal saline bolus Toradol ceftriaxone  AND admitted for pyelonephritis  Subjective: Seen and examined this morning Complains of some left flank pain Overnight afebrile BP stable Labs showed leukocytosis resolved CMP stable. Blood culture- ctx-m ESBL E. Coli  Assessment and plan:  Pyelonephritis ctx-m ESBL E. Coli sepsis POA: Patient met sepsis criteria tachycardia 106, leukocytosis and pyelonephritis on admission.  Vital stable normal lactic acid.  Continue meropenem.  Follow-up further culture data.  Hypokalemia Hypomagnesemia: Replace repeat labs improved Recent Labs  Lab 10/02/23 0440 10/03/23 0349  K 3.2* 3.8  CALCIUM 8.4* 8.3*  MG 1.5*  --   PHOS 3.6  --     Prolonged QT interval: Monitor  Mild protein malnutrition  Augment diet    Hyperglycemia: Blood sugar stable 86 this morning.  Anxiety Moderate major depression : Continue trazodone lorazepam 0.5 mg p.o.  every 8 hr prn, bupropion 300 mg p.o  GERD: Cont on Antiacid, H2 blocker or PPI as needed.     Postoperative hypothyroidism Continue with thyroxine 112 mcg p.o. daily.     Gastric bypass status for obesity BMI is normal now.       DVT prophylaxis: enoxaparin (LOVENOX) injection 40 mg Start: 10/02/23 2200 Code Status:   Code Status: Full Code Family Communication: plan of care discussed with patient/husband at bedside. Patient status is: Remains hospitalized because of severity of illness Level of care: Telemetry   Dispo: The patient is from: home            Anticipated disposition: TBD  Objective: Vitals last 24 hrs: Vitals:   10/02/23 2000 10/03/23 0022 10/03/23 0326 10/03/23 0800  BP: (!) 131/55 (!) 103/57 (!) 151/81 (!) 140/76  Pulse: 85 87 80 84  Resp:    (!) 21  Temp: 99.3 F (37.4 C) 98.8 F (37.1 C) 98.4 F (36.9 C) 99.8 F (37.7 C)  TempSrc: Oral Oral Oral Oral  SpO2: 100% 97% 96%   Weight:      Height:       Weight change:   Physical Examination: General exam: alert awake, older than stated age HEENT:Oral mucosa moist, Ear/Nose WNL grossly Respiratory system: Bilaterally diminished BS, no use of accessory muscle Cardiovascular system: S1 & S2 +. Gastrointestinal system: Abdomen soft, NT,ND,BS+ Nervous System: Alert, awake,following commands. Extremities: LE edema neg, moving arms, warm legs Skin: No rashes,warm. MSK: Normal muscle bulk/tone.   Medications reviewed:  Scheduled Meds:  buPROPion  300 mg Oral Daily   enoxaparin (LOVENOX) injection  40 mg Subcutaneous Q24H   ketorolac  15 mg Intravenous Q6H   levothyroxine  112 mcg Oral QAC  breakfast   pantoprazole  40 mg Oral Daily   senna-docusate  1 tablet Oral BID   traZODone  50 mg Oral QHS   venlafaxine XR  150 mg Oral Daily   Continuous Infusions:  meropenem (MERREM) IV 1 g (10/03/23 0954)      Diet Order             Diet regular Room service appropriate? Yes; Fluid consistency: Thin   Diet effective now                            Intake/Output Summary (Last 24 hours) at 10/03/2023 1257 Last data filed at 10/03/2023 0600 Gross per 24 hour  Intake 1858.12 ml  Output 1 ml  Net 1857.12 ml   Net IO Since Admission: 2,857.12 mL [10/03/23 1257]  Wt Readings from Last 3 Encounters:  10/02/23 68 kg  05/10/23 82.6 kg  04/27/23 84.8 kg     Unresulted Labs (From admission, onward)     Start     Ordered   10/02/23 0640  Urine Culture  Once,   R        10/02/23 0640          Data Reviewed: I have personally reviewed following labs and imaging studies ( see epic result tab) CBC: Recent Labs  Lab 10/02/23 0440 10/03/23 0349  WBC 14.1* 6.7  NEUTROABS 11.9*  --   HGB 12.3 11.4*  HCT 37.7 36.3  MCV 89.8 93.3  PLT 237 196   CMP: Recent Labs  Lab 10/02/23 0440 10/03/23 0349  NA 135 138  K 3.2* 3.8  CL 101 106  CO2 23 24  GLUCOSE 104* 86  BUN 16 12  CREATININE 0.87 0.62  CALCIUM 8.4* 8.3*  MG 1.5*  --   PHOS 3.6  --    GFR: Estimated Creatinine Clearance: 75.4 mL/min (by C-G formula based on SCr of 0.62 mg/dL). Recent Labs  Lab 10/02/23 0440 10/03/23 0349  AST 19 41  ALT 13 22  ALKPHOS 47 56  BILITOT 1.0 0.4  PROT 7.1 6.2*  ALBUMIN 3.4* 2.8*   Recent Labs  Lab 10/02/23 0528  LATICACIDVEN 0.8   Recent Results (from the past 240 hours)  Blood Culture (routine x 2)     Status: None (Preliminary result)   Collection Time: 10/02/23  5:11 AM   Specimen: BLOOD  Result Value Ref Range Status   Specimen Description   Final    BLOOD RIGHT ANTECUBITAL Performed at [facility], [address], [place], [place] [address]    Special Requests   Final    BOTTLES DRAWN AEROBIC AND ANAEROBIC Blood Culture results may not be optimal due to an inadequate volume of blood received in culture bottles Performed at [facility], [address], [place], [place] [address]    Culture   Final    NO GROWTH < 24  HOURS Performed at [facility], [address] [place], [place] [address]    Report Status PENDING  Incomplete  Resp panel by RT-PCR (RSV, Flu A&B, Covid) Anterior Nasal Swab     Status: None   Collection Time: 10/02/23  5:11 AM   Specimen: Anterior Nasal Swab  Result Value Ref Range Status   SARS Coronavirus 2 by RT PCR NEGATIVE NEGATIVE Final    Comment: (NOTE) SARS-CoV-2 target nucleic acids are NOT DETECTED.  The SARS-CoV-2 RNA is generally detectable in  upper respiratory specimens during the acute phase of infection. The lowest concentration of SARS-CoV-2 viral copies this assay can detect is 138 copies/mL. A negative result does not preclude SARS-Cov-2 infection and should not be used as the sole basis for treatment or other patient management decisions. A negative result may occur with  improper specimen collection/handling, submission of specimen other than nasopharyngeal swab, presence of viral mutation(s) within the areas targeted by this assay, and inadequate number of viral copies(<138 copies/mL). A negative result must be combined with clinical observations, patient history, and epidemiological information. The expected result is Negative.  Fact Sheet for Patients:  BloggerCourse.com  Fact Sheet for Healthcare Providers:  SeriousBroker.it  This test is no t yet approved or cleared by the [place] FDA and  has been authorized for detection and/or diagnosis of SARS-CoV-2 by FDA under an Emergency Use Authorization (EUA). This EUA will remain  in effect (meaning this test can be used) for the duration of the COVID-19 declaration under Section 564(b)(1) of the Act, 21 U.S.C.section 360bbb-3(b)(1), unless the authorization is terminated  or revoked sooner.       Influenza A by PCR NEGATIVE NEGATIVE Final   Influenza B by PCR NEGATIVE NEGATIVE Final    Comment: (NOTE) The Xpert Xpress SARS-CoV-2/FLU/RSV  plus assay is intended as an aid in the diagnosis of influenza from Nasopharyngeal swab specimens and should not be used as a sole basis for treatment. Nasal washings and aspirates are unacceptable for Xpert Xpress SARS-CoV-2/FLU/RSV testing.  Fact Sheet for Patients: BloggerCourse.com  Fact Sheet for Healthcare Providers: SeriousBroker.it  This test is not yet approved or cleared by the [place] FDA and has been authorized for detection and/or diagnosis of SARS-CoV-2 by FDA under an Emergency Use Authorization (EUA). This EUA will remain in effect (meaning this test can be used) for the duration of the COVID-19 declaration under Section 564(b)(1) of the Act, 21 U.S.C. section 360bbb-3(b)(1), unless the authorization is terminated or revoked.     Resp Syncytial Virus by PCR NEGATIVE NEGATIVE Final    Comment: (NOTE) Fact Sheet for Patients: BloggerCourse.com  Fact Sheet for Healthcare Providers: SeriousBroker.it  This test is not yet approved or cleared by the [place] FDA and has been authorized for detection and/or diagnosis of SARS-CoV-2 by FDA under an Emergency Use Authorization (EUA). This EUA will remain in effect (meaning this test can be used) for the duration of the COVID-19 declaration under Section 564(b)(1) of the Act, 21 U.S.C. section 360bbb-3(b)(1), unless the authorization is terminated or revoked.  Performed at [facility], [address] [place], [place] [address]   Blood Culture (routine x 2)     Status: None (Preliminary result)   Collection Time: 10/02/23  5:30 AM   Specimen: BLOOD LEFT ARM  Result Value Ref Range Status   Specimen Description   Final    BLOOD LEFT ARM Performed at [facility], [address] [place], [place] [address]    Special Requests   Final    BOTTLES DRAWN AEROBIC AND ANAEROBIC Blood  Culture results may not be optimal due to an inadequate volume of blood received in culture bottles Performed at [facility], [address], [place], [place] [address]    Culture  Setup Time   Final    GRAM NEGATIVE RODS ANAEROBIC BOTTLE ONLY CRITICAL RESULT CALLED TO, READ BACK BY AND VERIFIED WITH: PHARMD E JACKSON 10/03/2023 @ 0149 BY AB Performed at [facility], [address] [place], [place] [address]    Culture GRAM NEGATIVE RODS  Final   Report Status PENDING  Incomplete  Blood Culture ID Panel (Reflexed)     Status: Abnormal   Collection Time: 10/02/23  5:30 AM  Result Value Ref Range Status   Enterococcus faecalis NOT DETECTED NOT DETECTED Final   Enterococcus Faecium NOT DETECTED NOT DETECTED Final   Listeria monocytogenes NOT DETECTED NOT DETECTED Final   Staphylococcus species NOT DETECTED NOT DETECTED Final   Staphylococcus aureus (BCID) NOT DETECTED NOT DETECTED Final   Staphylococcus epidermidis NOT DETECTED NOT DETECTED Final   Staphylococcus lugdunensis NOT DETECTED NOT DETECTED Final   Streptococcus species NOT DETECTED NOT DETECTED Final   Streptococcus agalactiae NOT DETECTED NOT DETECTED Final   Streptococcus pneumoniae NOT DETECTED NOT DETECTED Final   Streptococcus pyogenes NOT DETECTED NOT DETECTED Final   A.calcoaceticus-baumannii NOT DETECTED NOT DETECTED Final   Bacteroides fragilis NOT DETECTED NOT DETECTED Final   Enterobacterales DETECTED (A) NOT DETECTED Final    Comment: Enterobacterales represent a large order of gram negative bacteria, not a single organism. CRITICAL RESULT CALLED TO, READ BACK BY AND VERIFIED WITH: PHARMD E JACKSON 10/03/2023 @ 0149 BY AB    Enterobacter cloacae complex NOT DETECTED NOT DETECTED Final   Escherichia coli DETECTED (A) NOT DETECTED Final    Comment: CRITICAL RESULT CALLED TO, READ BACK BY AND VERIFIED WITH: PHARMD E JACKSON 10/03/2023 @ 0149 BY AB    Klebsiella aerogenes NOT DETECTED NOT  DETECTED Final   Klebsiella oxytoca NOT DETECTED NOT DETECTED Final   Klebsiella pneumoniae NOT DETECTED NOT DETECTED Final   Proteus species NOT DETECTED NOT DETECTED Final   Salmonella species NOT DETECTED NOT DETECTED Final   Serratia marcescens NOT DETECTED NOT DETECTED Final   Haemophilus influenzae NOT DETECTED NOT DETECTED Final   Neisseria meningitidis NOT DETECTED NOT DETECTED Final   Pseudomonas aeruginosa NOT DETECTED NOT DETECTED Final   Stenotrophomonas maltophilia NOT DETECTED NOT DETECTED Final   Candida albicans NOT DETECTED NOT DETECTED Final   Candida auris NOT DETECTED NOT DETECTED Final   Candida glabrata NOT DETECTED NOT DETECTED Final   Candida krusei NOT DETECTED NOT DETECTED Final   Candida parapsilosis NOT DETECTED NOT DETECTED Final   Candida tropicalis NOT DETECTED NOT DETECTED Final   Cryptococcus neoformans/gattii NOT DETECTED NOT DETECTED Final   CTX-M ESBL DETECTED (A) NOT DETECTED Final    Comment: CRITICAL RESULT CALLED TO, READ BACK BY AND VERIFIED WITH: PHARMD E JACKSON 10/03/2023 @ 0149 BY AB (NOTE) Extended spectrum beta-lactamase detected. Recommend a carbapenem as initial therapy.      Carbapenem resistance IMP NOT DETECTED NOT DETECTED Final   Carbapenem resistance KPC NOT DETECTED NOT DETECTED Final   Carbapenem resistance NDM NOT DETECTED NOT DETECTED Final   Carbapenem resist OXA 48 LIKE NOT DETECTED NOT DETECTED Final   Carbapenem resistance VIM NOT DETECTED NOT DETECTED Final    Comment: Performed at [facility], [address] [place], [place] [address]     Antimicrobials/Microbiology: Anti-infectives (From admission, onward)    Start     Dose/Rate Route Frequency Ordered Stop   10/03/23 1000  cefTRIAXone (ROCEPHIN) 1 g in sodium chloride 0.9 % 100 mL IVPB  Status:  Discontinued        1 g 200 mL/hr over 30 Minutes Intravenous Every 24 hours 10/02/23 0753 10/03/23 0157   10/03/23 0230  meropenem (MERREM) 1 g in sodium  chloride  0.9 % 100 mL IVPB        1 g 200 mL/hr over 30 Minutes Intravenous Every 8 hours 10/03/23 0157     10/02/23 0700  cefTRIAXone (ROCEPHIN) 1 g in sodium chloride 0.9 % 100 mL IVPB        1 g 200 mL/hr over 30 Minutes Intravenous  Once 10/02/23 0654 10/02/23 0740         Component Value Date/Time   SDES  10/02/2023 0530    BLOOD LEFT ARM Performed at [facility], [address], [place], [place] [address]    SPECREQUEST  10/02/2023 0530    BOTTLES DRAWN AEROBIC AND ANAEROBIC Blood Culture results may not be optimal due to an inadequate volume of blood received in culture bottles Performed at [facility], [address], [place], [place] [address]    Elpidio Anis NEGATIVE RODS 10/02/2023 0530   REPTSTATUS PENDING 10/02/2023 0530     Radiology Studies: CT Renal Stone Study Result Date: 10/02/2023 CLINICAL DATA:  58 year old female with abdomen and flank pain. History of kidney stones. EXAM: CT ABDOMEN AND PELVIS WITHOUT CONTRAST TECHNIQUE: Multidetector CT imaging of the abdomen and pelvis was performed following the standard protocol without IV contrast. RADIATION DOSE REDUCTION: This exam was performed according to the departmental dose-optimization program which includes automated exposure control, adjustment of the mA and/or kV according to patient size and/or use of iterative reconstruction technique. COMPARISON:  CT Abdomen and Pelvis 06/28/2023. FINDINGS: Lower chest: Heart size remains normal.  Negative lung bases. Hepatobiliary: Negative noncontrast liver and gallbladder. Pancreas: Negative. Spleen: Negative. Adrenals/Urinary Tract: Negative adrenal glands. Noncontrast right kidney appears stable since December. No right nephrolithiasis. Prominent right renal pelvis but decompressed right ureter which seems to remain diminutive to the bladder. Right pelvic phleboliths appear stable. Increased left renal pelvis size. Small 3 mm left lower pole calculus now.  Left periureteral stranding, but no left hydroureter. And the distal left ureter in the pelvis seems to remain normal. No convincing ureteral calculus. Left hemipelvis phleboliths are stable. Negative urinary bladder, no stone within the bladder. Stomach/Bowel: Redundant sigmoid colon at the pelvic inlet with retained gas and stool. Diverticulosis in the upstream descending colon, no definite active inflammation there. Negative transverse and right colon. Normal gas containing appendix series 2, image 60. Nondilated small bowel. Chronic gastric bypass. Downstream small bowel anastomosis in the left abdomen series 2, image 30 appears stable. No pneumoperitoneum, free fluid, convincing mesenteric inflammation. Vascular/Lymphatic: Normal caliber abdominal aorta. Mild Aortoiliac calcified atherosclerosis. No lymphadenopathy identified. Reproductive: Surgically absent uterus. Diminutive or absent ovaries. Other: No pelvis free fluid. Musculoskeletal: Lumbar disc and endplate degeneration with occasional vacuum disc. No acute osseous abnormality identified. IMPRESSION: 1. Persistent left nephrolithiasis, both renal pelves slightly larger since December, and mild left periureteral stranding. But no urinary calculus identified. Query recently passed stone. However, UTI or urinary infection not excluded. 2. No other acute or inflammatory process identified in the noncontrast abdomen or pelvis. 3. 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 Electronically Signed   By: Odessa Fleming M.D.   On: 10/02/2023 06:42   DG Chest Port 1 View Result Date: 10/02/2023 CLINICAL DATA:  58 year old female with possible sepsis. EXAM: PORTABLE CHEST 1 VIEW COMPARISON:  Chest CT 05/30/2023 and earlier. FINDINGS: Portable AP semi upright view at 0532 hours. Low normal lung volumes. Normal cardiac size and mediastinal contours. Visualized tracheal air column is within normal limits. Allowing for portable technique the lungs are clear.  No  pneumothorax or pleural effusion. Chronic cervical ACDF and thyroidectomy. Negative visible bowel gas. No acute osseous abnormality identified. IMPRESSION: No acute cardiopulmonary abnormality. Electronically Signed   By: Odessa Fleming M.D.   On: 10/02/2023 06:17     LOS: 1 day   Total time spent in review of labs and imaging, patient evaluation, formulation of plan, documentation and communication with patient/family: 50 minutes  Lanae Boast, MD [facility] 10/03/2023, 12:57 PM

## 2023-10-04 DIAGNOSIS — N12 Tubulo-interstitial nephritis, not specified as acute or chronic: Secondary | ICD-10-CM | POA: Diagnosis not present

## 2023-10-04 MED ORDER — LEVOTHYROXINE SODIUM 112 MCG PO TABS
112.0000 ug | ORAL_TABLET | Freq: Every day | ORAL | Status: DC
Start: 2023-10-04 — End: 2023-10-06
  Administered 2023-10-04 – 2023-10-05 (×2): 112 ug via ORAL
  Filled 2023-10-04 (×2): qty 1

## 2023-10-04 NOTE — Progress Notes (Signed)
 PROGRESS NOTE [name]  QIO:[id number] DOB: October 08, 1965 DOA: 10/02/2023 PCP: Audie Pinto, FNP  Brief Narrative/Hospital Course:  58 y.o. female with medical history significant of anxiety, depression, B12 deficiency, history of obesity, history of gastric bypass surgery, skin cancer, GERD, history of nephrolithiasis and bilateral hydronephrosis, hypothyroidism, sarcoidosis, pulmonary nodules who presented to the ED  W/  complaints of dizziness, headache, mild dysuria and left flank pain after she spent significant portion of the day working outside in her yard.  She was recently started on ciprofloxacin for UTI, but has only taken 1 dose.  In the ED afebrile vitals otherwise stable. Labs concerning for UTI with leukocytosis. Chest x-ray no acute findings CT abdomen pelvis without contrast persistent left nephrolithiasis,both renal pelvis slightly larger since December, and mild left periureteral stranding, but no urinary calculus identified.  No other acute or inflammatory process identified received normal saline bolus Toradol ceftriaxone  AND admitted for pyelonephritis  Subjective: Seen and examined Some flank pain but feeling better Overnight afebrile BP stable Labs fairly stable CBC CMP this morning   Assessment and plan:  Pyelonephritis ctx-m ESBL E. Coli sepsis POA: Patient met sepsis criteria tachycardia 106, leukocytosis and pyelonephritis on admission.  Hemodynamically stable lactic acid normal.  Continue meropenem will discuss with ID pending further culture report. TTE looks stable.  Hypokalemia Hypomagnesemia: Resolved  Prolonged QT interval: Monitor  Mild protein malnutrition  Augment diet    Hyperglycemia: Blood sugar stable   Anxiety Moderate major depression : Continue trazodone lorazepam 0.5 mg p.o. every 8 hr prn, bupropion 300 mg p.o  GERD: Cont on Antiacid, H2 blocker or PPI as needed.   Postoperative hypothyroidism Continue with thyroxine 112  mcg p.o. daily.   Gastric bypass status for obesity BMI is normal now.     DVT prophylaxis: enoxaparin (LOVENOX) injection 40 mg Start: 10/02/23 2200 Code Status:   Code Status: Full Code Family Communication: plan of care discussed with patient/husband at bedside. Patient status is: Remains hospitalized because of severity of illness Level of care: Telemetry   Dispo: The patient is from: home            Anticipated disposition: tbd  Objective: Vitals last 24 hrs: Vitals:   10/03/23 1300 10/03/23 1630 10/03/23 2028 10/04/23 0648  BP: 132/70 (!) 147/83 (!) 142/84 (!) 158/92  Pulse: 83 86 79 89  Resp:  19 18 16   Temp: 98.1 F (36.7 C) 99.3 F (37.4 C) 98.9 F (37.2 C) 98 F (36.7 C)  TempSrc: Oral Oral Oral Oral  SpO2: 97% 97% 98% 96%  Weight:      Height:       Weight change:   Physical Examination: General exam: alert awake, oriented HEENT:Oral mucosa moist, Ear/Nose WNL grossly Respiratory system: Bilaterally clear BS,no use of accessory muscle Cardiovascular system: S1 & S2 +, No JVD. Gastrointestinal system: Abdomen soft,NT,ND, BS+ Nervous System: Alert, awake, moving all extremities,and following commands. Extremities: LE edema neg,distal peripheral pulses palpable and warm.  Skin: No rashes,no icterus. MSK: Normal muscle bulk,tone, power   Medications reviewed:  Scheduled Meds:  buPROPion  300 mg Oral Daily   enoxaparin (LOVENOX) injection  40 mg Subcutaneous Q24H   ketorolac  15 mg Intravenous Q6H   levothyroxine  112 mcg Oral QHS   pantoprazole  40 mg Oral Daily   senna-docusate  1 tablet Oral BID   traZODone  50 mg Oral QHS   venlafaxine XR  150 mg Oral Daily   vitamin B-12  500 mcg Oral Daily   Continuous Infusions:  meropenem (MERREM) IV 1 g (10/04/23 0220)      Diet Order             Diet regular Room service appropriate? Yes; Fluid consistency: Thin  Diet effective now                            Intake/Output Summary (Last  24 hours) at 10/04/2023 1023 Last data filed at 10/03/2023 2200 Gross per 24 hour  Intake 340 ml  Output --  Net 340 ml   Net IO Since Admission: 3,297.12 mL [10/04/23 1023]  Wt Readings from Last 3 Encounters:  10/02/23 68 kg  05/10/23 82.6 kg  04/27/23 84.8 kg     Unresulted Labs (From admission, onward)    None     Data Reviewed: I have personally reviewed following labs and imaging studies ( see epic result tab) CBC: Recent Labs  Lab 10/02/23 0440 10/03/23 0349  WBC 14.1* 6.7  NEUTROABS 11.9*  --   HGB 12.3 11.4*  HCT 37.7 36.3  MCV 89.8 93.3  PLT 237 196   CMP: Recent Labs  Lab 10/02/23 0440 10/03/23 0349  NA 135 138  K 3.2* 3.8  CL 101 106  CO2 23 24  GLUCOSE 104* 86  BUN 16 12  CREATININE 0.87 0.62  CALCIUM 8.4* 8.3*  MG 1.5*  --   PHOS 3.6  --    GFR: Estimated Creatinine Clearance: 75.4 mL/min (by C-G formula based on SCr of 0.62 mg/dL). Recent Labs  Lab 10/02/23 0440 10/03/23 0349  AST 19 41  ALT 13 22  ALKPHOS 47 56  BILITOT 1.0 0.4  PROT 7.1 6.2*  ALBUMIN 3.4* 2.8*   Recent Labs  Lab 10/02/23 0528  LATICACIDVEN 0.8   Recent Results (from the past 240 hours)  Blood Culture (routine x 2)     Status: Abnormal (Preliminary result)   Collection Time: 10/02/23  5:11 AM   Specimen: BLOOD  Result Value Ref Range Status   Specimen Description   Final    BLOOD RIGHT ANTECUBITAL Performed at [facility], [address], [place], [place] [address]    Special Requests   Final    BOTTLES DRAWN AEROBIC AND ANAEROBIC Blood Culture results may not be optimal due to an inadequate volume of blood received in culture bottles Performed at [facility], [address] [place], [place] [address]    Culture  Setup Time   Final    GRAM NEGATIVE RODS AEROBIC BOTTLE ONLY CRITICAL RESULT CALLED TO, READ BACK BY AND VERIFIED WITH: PHARMD MARIA S. [phone] FCP    Culture (A)  Final    ESCHERICHIA  COLI SUSCEPTIBILITIES TO FOLLOW Performed at [facility], [address], [place], [place] [address]    Report Status PENDING  Incomplete  Resp panel by RT-PCR (RSV, Flu A&B, Covid) Anterior Nasal Swab     Status: None   Collection Time: 10/02/23  5:11 AM   Specimen: Anterior Nasal Swab  Result Value Ref Range Status   SARS Coronavirus 2 by RT PCR NEGATIVE NEGATIVE Final    Comment: (NOTE) SARS-CoV-2 target nucleic acids are NOT DETECTED.  The SARS-CoV-2 RNA is generally detectable in upper respiratory specimens during the acute phase of infection. The lowest concentration of SARS-CoV-2 viral copies this assay can detect is 138 copies/mL. A negative result does not  preclude SARS-Cov-2 infection and should not be used as the sole basis for treatment or other patient management decisions. A negative result may occur with  improper specimen collection/handling, submission of specimen other than nasopharyngeal swab, presence of viral mutation(s) within the areas targeted by this assay, and inadequate number of viral copies(<138 copies/mL). A negative result must be combined with clinical observations, patient history, and epidemiological information. The expected result is Negative.  Fact Sheet for Patients:  BloggerCourse.com  Fact Sheet for Healthcare Providers:  SeriousBroker.it  This test is no t yet approved or cleared by the [place] FDA and  has been authorized for detection and/or diagnosis of SARS-CoV-2 by FDA under an Emergency Use Authorization (EUA). This EUA will remain  in effect (meaning this test can be used) for the duration of the COVID-19 declaration under Section 564(b)(1) of the Act, 21 U.S.C.section 360bbb-3(b)(1), unless the authorization is terminated  or revoked sooner.       Influenza A by PCR NEGATIVE NEGATIVE Final   Influenza B by PCR NEGATIVE NEGATIVE Final    Comment: (NOTE) The Xpert  Xpress SARS-CoV-2/FLU/RSV plus assay is intended as an aid in the diagnosis of influenza from Nasopharyngeal swab specimens and should not be used as a sole basis for treatment. Nasal washings and aspirates are unacceptable for Xpert Xpress SARS-CoV-2/FLU/RSV testing.  Fact Sheet for Patients: BloggerCourse.com  Fact Sheet for Healthcare Providers: SeriousBroker.it  This test is not yet approved or cleared by the [place] FDA and has been authorized for detection and/or diagnosis of SARS-CoV-2 by FDA under an Emergency Use Authorization (EUA). This EUA will remain in effect (meaning this test can be used) for the duration of the COVID-19 declaration under Section 564(b)(1) of the Act, 21 U.S.C. section 360bbb-3(b)(1), unless the authorization is terminated or revoked.     Resp Syncytial Virus by PCR NEGATIVE NEGATIVE Final    Comment: (NOTE) Fact Sheet for Patients: BloggerCourse.com  Fact Sheet for Healthcare Providers: SeriousBroker.it  This test is not yet approved or cleared by the [place] FDA and has been authorized for detection and/or diagnosis of SARS-CoV-2 by FDA under an Emergency Use Authorization (EUA). This EUA will remain in effect (meaning this test can be used) for the duration of the COVID-19 declaration under Section 564(b)(1) of the Act, 21 U.S.C. section 360bbb-3(b)(1), unless the authorization is terminated or revoked.  Performed at [facility], [address], [place], [place] [address]   Blood Culture (routine x 2)     Status: Abnormal (Preliminary result)   Collection Time: 10/02/23  5:30 AM   Specimen: BLOOD LEFT ARM  Result Value Ref Range Status   Specimen Description   Final    BLOOD LEFT ARM Performed at [facility], [address] [place], [place] [address]    Special Requests   Final    BOTTLES DRAWN  AEROBIC AND ANAEROBIC Blood Culture results may not be optimal due to an inadequate volume of blood received in culture bottles Performed at [facility], [address] [place], [place] [address]    Culture  Setup Time   Final    GRAM NEGATIVE RODS ANAEROBIC BOTTLE ONLY CRITICAL RESULT CALLED TO, READ BACK BY AND VERIFIED WITH: PHARMD E JACKSON 10/03/2023 @ 0149 BY AB    Culture (A)  Final    ESCHERICHIA COLI SUSCEPTIBILITIES TO FOLLOW Performed at [facility], [address] [place], [place] [address]    Report  Status PENDING  Incomplete  Blood Culture ID Panel (Reflexed)     Status: Abnormal   Collection Time: 10/02/23  5:30 AM  Result Value Ref Range Status   Enterococcus faecalis NOT DETECTED NOT DETECTED Final   Enterococcus Faecium NOT DETECTED NOT DETECTED Final   Listeria monocytogenes NOT DETECTED NOT DETECTED Final   Staphylococcus species NOT DETECTED NOT DETECTED Final   Staphylococcus aureus (BCID) NOT DETECTED NOT DETECTED Final   Staphylococcus epidermidis NOT DETECTED NOT DETECTED Final   Staphylococcus lugdunensis NOT DETECTED NOT DETECTED Final   Streptococcus species NOT DETECTED NOT DETECTED Final   Streptococcus agalactiae NOT DETECTED NOT DETECTED Final   Streptococcus pneumoniae NOT DETECTED NOT DETECTED Final   Streptococcus pyogenes NOT DETECTED NOT DETECTED Final   A.calcoaceticus-baumannii NOT DETECTED NOT DETECTED Final   Bacteroides fragilis NOT DETECTED NOT DETECTED Final   Enterobacterales DETECTED (A) NOT DETECTED Final    Comment: Enterobacterales represent a large order of gram negative bacteria, not a single organism. CRITICAL RESULT CALLED TO, READ BACK BY AND VERIFIED WITH: PHARMD E JACKSON 10/03/2023 @ 0149 BY AB    Enterobacter cloacae complex NOT DETECTED NOT DETECTED Final   Escherichia coli DETECTED (A) NOT DETECTED Final    Comment: CRITICAL RESULT CALLED TO, READ BACK BY AND VERIFIED WITH: PHARMD E JACKSON  10/03/2023 @ 0149 BY AB    Klebsiella aerogenes NOT DETECTED NOT DETECTED Final   Klebsiella oxytoca NOT DETECTED NOT DETECTED Final   Klebsiella pneumoniae NOT DETECTED NOT DETECTED Final   Proteus species NOT DETECTED NOT DETECTED Final   Salmonella species NOT DETECTED NOT DETECTED Final   Serratia marcescens NOT DETECTED NOT DETECTED Final   Haemophilus influenzae NOT DETECTED NOT DETECTED Final   Neisseria meningitidis NOT DETECTED NOT DETECTED Final   Pseudomonas aeruginosa NOT DETECTED NOT DETECTED Final   Stenotrophomonas maltophilia NOT DETECTED NOT DETECTED Final   Candida albicans NOT DETECTED NOT DETECTED Final   Candida auris NOT DETECTED NOT DETECTED Final   Candida glabrata NOT DETECTED NOT DETECTED Final   Candida krusei NOT DETECTED NOT DETECTED Final   Candida parapsilosis NOT DETECTED NOT DETECTED Final   Candida tropicalis NOT DETECTED NOT DETECTED Final   Cryptococcus neoformans/gattii NOT DETECTED NOT DETECTED Final   CTX-M ESBL DETECTED (A) NOT DETECTED Final    Comment: CRITICAL RESULT CALLED TO, READ BACK BY AND VERIFIED WITH: PHARMD E JACKSON 10/03/2023 @ 0149 BY AB (NOTE) Extended spectrum beta-lactamase detected. Recommend a carbapenem as initial therapy.      Carbapenem resistance IMP NOT DETECTED NOT DETECTED Final   Carbapenem resistance KPC NOT DETECTED NOT DETECTED Final   Carbapenem resistance NDM NOT DETECTED NOT DETECTED Final   Carbapenem resist OXA 48 LIKE NOT DETECTED NOT DETECTED Final   Carbapenem resistance VIM NOT DETECTED NOT DETECTED Final    Comment: Performed at [facility], [address] [place], [place] [address]  Urine Culture     Status: Abnormal   Collection Time: 10/02/23  6:40 AM   Specimen: Urine, Random  Result Value Ref Range Status   Specimen Description   Final    URINE, RANDOM Performed at [facility], [address], [place], [place] [address]    Special Requests   Final    NONE Reflexed  from [id number] Performed at [facility], [address], [place], [place] [address]    Culture (A)  Final    <10,000 COLONIES/mL INSIGNIFICANT GROWTH Performed at [facility], [address], [place], [place] [address]    Report Status 10/03/2023 FINAL  Final     Antimicrobials/Microbiology: Anti-infectives (From admission, onward)    Start     Dose/Rate Route Frequency Ordered Stop   10/03/23 1000  cefTRIAXone (ROCEPHIN) 1 g in sodium chloride 0.9 % 100 mL IVPB  Status:  Discontinued        1 g 200 mL/hr over 30 Minutes Intravenous Every 24 hours 10/02/23 0753 10/03/23 0157   10/03/23 0230  meropenem (MERREM) 1 g in sodium chloride 0.9 % 100 mL IVPB        1 g 200 mL/hr over 30 Minutes Intravenous Every 8 hours 10/03/23 0157     10/02/23 0700  cefTRIAXone (ROCEPHIN) 1 g in sodium chloride 0.9 % 100 mL IVPB        1 g 200 mL/hr over 30 Minutes Intravenous  Once 10/02/23 0654 10/02/23 0740         Component Value Date/Time   SDES  10/02/2023 0640    URINE, RANDOM Performed at [facility], [address], [place], [place] [address]    SPECREQUEST  10/02/2023 0640    NONE Reflexed from [id number] Performed at [facility], [address], [place], [place] [address]    CULT (A) 10/02/2023 0640    <10,000 COLONIES/mL INSIGNIFICANT GROWTH Performed at [facility], [address] [place], [place] [address]    REPTSTATUS 10/03/2023 FINAL 10/02/2023 7253     Radiology Studies: ECHOCARDIOGRAM COMPLETE Result Date: 10/03/2023    ECHOCARDIOGRAM REPORT   Patient Name:   Tammy Wilson Date of Exam: 10/03/2023 Medical Rec #:  [id number]          Height:       67.0 in Accession #:    [id number]         Weight:       150.0 lb Date of Birth:  13-Aug-1965          BSA:          1.790 m Patient Age:    58 years           BP:           151/81 mmHg Patient Gender: F                  HR:           80 bpm. Exam Location:   Inpatient Procedure: 2D Echo, Cardiac Doppler and Color Doppler (Both Spectral and Color            Flow Doppler were utilized during procedure). Indications:    Abnormal ECG  History:        Patient has no prior history of Echocardiogram examinations.  Sonographer:    Lamont Snowball Referring Phys: [id number] DAVID MANUEL ORTIZ IMPRESSIONS  1. Left ventricular ejection fraction, by estimation, is 55 to 60%. The left ventricle has normal function. The left ventricle has no regional wall motion abnormalities. There is mild asymmetric left ventricular hypertrophy of the septal segment. Left ventricular diastolic parameters are consistent with Grade I diastolic dysfunction (impaired relaxation).  2. Right ventricular systolic function is normal. The right ventricular size is normal. The estimated right ventricular systolic pressure is 26.2 mmHg.  3. Mild bileaflet mitral valve prolapse. The mitral valve is degenerative. Mild mitral valve regurgitation. No evidence of mitral stenosis.  4. The aortic valve is tricuspid. There is mild calcification of  the aortic valve. Aortic valve regurgitation is trivial. No aortic stenosis is present.  5. The inferior vena cava is normal in size with greater than 50% respiratory variability, suggesting right atrial pressure of 3 mmHg. FINDINGS  Left Ventricle: Left ventricular ejection fraction, by estimation, is 55 to 60%. The left ventricle has normal function. The left ventricle has no regional wall motion abnormalities. The left ventricular internal cavity size was normal in size. There is  mild asymmetric left ventricular hypertrophy of the septal segment. Left ventricular diastolic parameters are consistent with Grade I diastolic dysfunction (impaired relaxation). Right Ventricle: The right ventricular size is normal. No increase in right ventricular wall thickness. Right ventricular systolic function is normal. The tricuspid regurgitant velocity is 2.41 m/s, and with an assumed  right atrial pressure of 3 mmHg, the estimated right ventricular systolic pressure is 26.2 mmHg. Left Atrium: Left atrial size was normal in size. Right Atrium: Right atrial size was normal in size. Pericardium: There is no evidence of pericardial effusion. Mitral Valve: Mild bileaflet mitral valve prolapse. The mitral valve is degenerative in appearance. Mild to moderate mitral annular calcification. Mild mitral valve regurgitation. No evidence of mitral valve stenosis. MV peak gradient, 4.8 mmHg. The mean  mitral valve gradient is 3.0 mmHg. Tricuspid Valve: The tricuspid valve is normal in structure. Tricuspid valve regurgitation is mild . No evidence of tricuspid stenosis. Aortic Valve: The aortic valve is tricuspid. There is mild calcification of the aortic valve. Aortic valve regurgitation is trivial. No aortic stenosis is present. Aortic valve peak gradient measures 7.8 mmHg. Pulmonic Valve: The pulmonic valve was normal in structure. Pulmonic valve regurgitation is trivial. No evidence of pulmonic stenosis. Aorta: The aortic root is normal in size and structure. Venous: The inferior vena cava is normal in size with greater than 50% respiratory variability, suggesting right atrial pressure of 3 mmHg. IAS/Shunts: No atrial level shunt detected by color flow Doppler.  LEFT VENTRICLE PLAX 2D LVIDd:         4.30 cm   Diastology LVIDs:         2.70 cm   LV e' medial:    7.51 cm/s LV PW:         0.80 cm   LV E/e' medial:  14.4 LV IVS:        1.00 cm   LV e' lateral:   10.10 cm/s LVOT diam:     2.10 cm   LV E/e' lateral: 10.7 LV SV:         72 LV SV Index:   40 LVOT Area:     3.46 cm  RIGHT VENTRICLE             IVC RV Basal diam:  3.40 cm     IVC diam: 2.00 cm RV S prime:     15.40 cm/s TAPSE (M-mode): 2.0 cm LEFT ATRIUM             Index        RIGHT ATRIUM           Index LA Vol (A2C):   48.0 ml 26.82 ml/m  RA Area:     12.50 cm LA Vol (A4C):   44.3 ml 24.76 ml/m  RA Volume:   28.70 ml  16.04 ml/m LA Biplane  Vol: 47.2 ml 26.38 ml/m  AORTIC VALVE AV Area (Vmax): 2.52 cm AV Vmax:        140.00 cm/s AV Peak Grad:   7.8 mmHg LVOT Vmax:  102.00 cm/s LVOT Vmean:     75.800 cm/s LVOT VTI:       0.207 m  AORTA Ao Root diam: 3.10 cm Ao Asc diam:  3.40 cm MITRAL VALVE                TRICUSPID VALVE MV Area (PHT): 5.88 cm     TR Peak grad:   23.2 mmHg MV Area VTI:   2.70 cm     TR Vmax:        241.00 cm/s MV Peak grad:  4.8 mmHg MV Mean grad:  3.0 mmHg     SHUNTS MV Vmax:       1.09 m/s     Systemic VTI:  0.21 m MV Vmean:      75.6 cm/s    Systemic Diam: 2.10 cm MV Decel Time: 129 msec MV E velocity: 108.00 cm/s MV A velocity: 114.00 cm/s MV E/A ratio:  0.95 Weston Brass MD Electronically signed by Weston Brass MD Signature Date/Time: 10/03/2023/5:41:55 PM    Final      LOS: 2 days   Total time spent in review of labs and imaging, patient evaluation, formulation of plan, documentation and communication with patient/family:  35 minutes  Lanae Boast, MD [facility] 10/04/2023, 10:23 AM

## 2023-10-05 DIAGNOSIS — Z1612 Extended spectrum beta lactamase (ESBL) resistance: Secondary | ICD-10-CM | POA: Diagnosis not present

## 2023-10-05 DIAGNOSIS — N200 Calculus of kidney: Secondary | ICD-10-CM

## 2023-10-05 DIAGNOSIS — N390 Urinary tract infection, site not specified: Secondary | ICD-10-CM

## 2023-10-05 DIAGNOSIS — B9620 Unspecified Escherichia coli [E. coli] as the cause of diseases classified elsewhere: Secondary | ICD-10-CM

## 2023-10-05 DIAGNOSIS — N12 Tubulo-interstitial nephritis, not specified as acute or chronic: Secondary | ICD-10-CM | POA: Diagnosis not present

## 2023-10-05 LAB — CULTURE, BLOOD (ROUTINE X 2)

## 2023-10-05 MED ORDER — SODIUM CHLORIDE 0.9 % IV SOLN
1.0000 g | INTRAVENOUS | Status: DC
  Administered 2023-10-05 – 2023-10-06 (×2): 1 g via INTRAVENOUS
  Filled 2023-10-05 (×2): qty 1000

## 2023-10-05 MED ORDER — ERTAPENEM IV (FOR PTA / DISCHARGE USE ONLY): 1.0000 g | INTRAVENOUS | 0 refills | Status: AC

## 2023-10-05 NOTE — Plan of Care (Signed)

## 2023-10-05 NOTE — TOC CM/SW Note (Signed)
 Transition of Care [facility]) - Inpatient Brief Assessment   Patient Details  Name: Tammy Wilson MRN: [id number] Date of Birth: 05/10/1966  Transition of Care [facility]) CM/SW Contact:    Larrie Kass, LCSW Phone Number: 10/05/2023, 10:33 AM    Transition of Care Asessment: Insurance and Status: Insurance coverage has been reviewed Patient has primary care physician: Yes Home environment has been reviewed: home with spouse Prior level of function:: indepednent Prior/Current Home Services: No current home services Social Drivers of Health Review: SDOH reviewed no interventions necessary Readmission risk has been reviewed: Yes Transition of care needs: no transition of care needs at this time

## 2023-10-05 NOTE — Consult Note (Signed)
 [facility]                                                                                        Patient Identification: Patient Name: Tammy Wilson MRN: [id number] Admit Date: 10/02/2023  4:30 AM Today's Date: 10/05/2023 Reason for consult: Pyelonephritis Requesting provider: Dr. Jonathon Bellows  Principal Problem:   Pyelonephritis Active Problems:   Anxiety   GERD (gastroesophageal reflux disease)   Postoperative hypothyroidism   Gastric bypass status for obesity   Hypokalemia   Prolonged QT interval   Mild protein malnutrition (HCC)   Hyperglycemia   Moderate major depression (HCC)   Hypomagnesemia   Antibiotics:  Meropenem 4/6-  Lines/Hardware:  Assessment # ESBL E coli bacteremia # Complicated UTI  # Nephrolithiasis # Bactrim allergy - reports prior h/o itching as well thinks she might had a rash   Recommendations  -Will switch meropenem to ertapenem.  Complete 10 days course -Blood cultures today.  If no growth by tomorrow, can place midline for home abtx -Monitor CBC and CMP on antibiotics -OPAT as below -Contact precautions  -Fu with Urology for kidney stone -ID will sign off please recall back with questions or concerns  OPAT Diagnosis: Complicated UTI/possible pyelonephritis   Culture Result: ESBL E coli  Allergies  Allergen Reactions   Sulfa Antibiotics Nausea And Vomiting   Dilaudid [Hydromorphone Hcl] Palpitations   Sulfamethoxazole-Trimethoprim Rash    Nausea and vomitting    OPAT Orders Discharge antibiotics to be given via PICC line Discharge antibiotics: eratpenem  Per pharmacy protocol  Aim for Vancomycin trough 15-20 or AUC 400-550 (unless otherwise indicated) Duration: 10 days  End Date: 10/12/23  [facility] Care Per Protocol:  Home health RN for IV administration and teaching; PICC line care and labs.    Labs weekly while on IV antibiotics: X__ CBC  with differential __ BMP X__ CMP __ CRP __ ESR __ Vancomycin trough __ CK  X__ Please pull PIC at completion of IV antibiotics __ Please leave PIC in place until doctor has seen patient or been notified  Fax weekly labs to [phone]  Clinic Follow Up Appt: 4/22 at 3: 30 pm  Rest of the management as per the primary team. Please call with questions or concerns.  Thank you for the consult  __________________________________________________________________________________________________________ HPI and Hospital Course: 58 year old female with prior history of sarcoidosis, pulmonary nodules, hypothyroidism, kidney stones, GERD, basal cell carcinoma, anxiety/depression, B12 deficiency, obesity s/p gastric sleeve resection, of UTIs who presented to the ED on 4/6 from home with dizziness, back pain, dysuria and headache since Friday that started after significantly working in the yard.  She had subjective fevers but no chills or sweat.  No reported nausea, vomiting or abdominal pain or diarrhea.  Reports history of frequent UTIs,  she was started on ciprofloxacin for UTI 1 day prior to arrival.  At ED, afebrile dehydrated, hypotensive Labs remarkable for WBC 14.1, K3.2, magnesium 1.5, albumin 3.4 Influenza A/influenza B/RSV/SARS-CoV-2 negative UA with large leukocytes, negative nitrite, rare bacteria, urine culture <10k colonies.  Started on IVF, analgesics and IV ceftriaxone Chest x-ray with no acute  cardiopulmonary abnormality CT with persistent left nephrolithiasis, both renal pelvises slightly larger since December and mild left periureteral stranding, but no urinary calculus identified.  Query recently passed stone.  However UTI urinary infection not excluded.  No other acute inflammatory process identified in the noncontrasted abdomen or pelvis.  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   TTE with no vegetations or endocarditis  ID consulted for ESBL E. coli  bacteremia  ROS: General- Denies fever, chills, loss of appetite and loss of weight HEENT - Denies headache, blurry vision, neck pain, sinus pain Chest - Denies any chest pain, SOB or cough CVS- Denies any dizziness/lightheadedness, syncopal attacks, palpitations Abdomen- Denies any nausea, vomiting, abdominal pain, hematochezia and diarrhea Neuro - Denies any weakness, numbness, tingling sensation Psych - Denies any changes in mood irritability or depressive symptoms GU- Denies any burning, dysuria, hematuria or increased frequency of urination Skin - denies any rashes/lesions MSK - denies any joint pain/swelling or restricted ROM   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   bladder tack     GASTRIC ROUX-EN-Y N/A 09/08/2020   Procedure: LAPAROSCOPIC ROUX-EN-Y GASTRIC BYPASS WITH UPPER ENDOSCOPY, CONVERSION FROM LAPAROSCOPIC SLEEVE GASTECTOMY;  Surgeon: Luretha Murphy, MD;  Location: [facility];  Service: General;  Laterality: N/A;  3.5 HOURS TOTAL PLEASE   HIATAL HERNIA REPAIR N/A 09/08/2020   Procedure: HERNIA REPAIR HIATAL;  Surgeon: Luretha Murphy, MD;  Location: [facility];  Service: General;  Laterality: N/A;   LAPAROSCOPIC GASTRIC SLEEVE RESECTION N/A 06/09/2015   Procedure: LAPAROSCOPIC GASTRIC SLEEVE RESECTION;  Surgeon: Luretha Murphy, MD;  Location: [facility];  Service: General;  Laterality: N/A;    NECK SURGERY     Cervial fusion '12-[facility] - Dr. Jordan Likes   SINUSOTOMY     THYROIDECTOMY  2019   TUBAL LIGATION     UPPER GI ENDOSCOPY N/A 06/09/2015   Procedure: UPPER GI ENDOSCOPY;  Surgeon: Luretha Murphy, MD;  Location: [facility];  Service: General;  Laterality: N/A;   UPPER GI ENDOSCOPY N/A 09/08/2020   Procedure: UPPER GI ENDOSCOPY;  Surgeon: Luretha Murphy, MD;  Location: [facility];  Service: General;  Laterality: N/A;    Scheduled Meds:  buPROPion  300 mg Oral Daily   enoxaparin (LOVENOX) injection  40 mg Subcutaneous Q24H   ketorolac  15 mg Intravenous Q6H   levothyroxine  112 mcg Oral QHS   pantoprazole  40 mg Oral Daily   senna-docusate  1 tablet Oral BID   traZODone  50 mg Oral QHS   venlafaxine XR  150 mg Oral Daily   vitamin B-12  500 mcg Oral Daily   Continuous Infusions:  meropenem (MERREM) IV 1 g (10/05/23 0206)   PRN Meds:.acetaminophen **OR** acetaminophen, lip balm, LORazepam, polyethylene glycol, prochlorperazine  Allergies  Allergen Reactions   Sulfa Antibiotics Nausea And Vomiting   Dilaudid [Hydromorphone  Hcl] Palpitations   Sulfamethoxazole-Trimethoprim Rash    Nausea and vomitting   Social History   Socioeconomic History   Marital status: Married    Spouse name: Not on file   Number of children: Not on file   Years of education: Not on file   Highest education level: Not on file  Occupational History   Not on file  Tobacco Use   Smoking status: Never   Smokeless tobacco: Never  Vaping Use   Vaping status: Never Used  Substance and Sexual Activity   Alcohol use: Yes    Comment: very rare   Drug use: No   Sexual activity: Not Currently  Other Topics Concern   Not on file  Social History Narrative   Not on file   Social Drivers of Health   Financial Resource Strain: Not on file  Food Insecurity: No Food Insecurity (10/02/2023)   Hunger Vital Sign    Worried About Running Out of Food in the Last Year: Never true    Ran Out of Food in the Last  Year: Never true  Transportation Needs: No Transportation Needs (10/02/2023)   PRAPARE - Administrator, Civil Service (Medical): No    Lack of Transportation (Non-Medical): No  Physical Activity: Not on file  Stress: Not on file  Social Connections: Not on file  Intimate Partner Violence: Not At Risk (10/02/2023)   Humiliation, Afraid, Rape, and Kick questionnaire    Fear of Current or Ex-Partner: No    Emotionally Abused: No    Physically Abused: No    Sexually Abused: No   Family History  Problem Relation Age of Onset   Heart disease Mother    Rheum arthritis Mother    Colon cancer Neg Hx    Esophageal cancer Neg Hx    Stomach cancer Neg Hx    Rectal cancer Neg Hx    Vitals BP 126/65 (BP Location: Right Arm)   Pulse 65   Temp 98 F (36.7 C)   Resp 16   Ht 5\' 7"  (1.702 m)   Wt 68 kg   SpO2 98%   BMI 23.49 kg/m   Physical Exam Constitutional: Adult female lying in the bed, nontoxic appearing, comfortable    Comments: HEENT WNL  Cardiovascular:     Rate and Rhythm: Normal rate and regular rhythm.     Heart sounds: S1 and S2  Pulmonary:     Effort: Pulmonary effort is normal.     Comments: Normal breath sounds  Abdominal:     Palpations: Abdomen is soft.     Tenderness: Nontender and nondistended.  No CVA tenderness  Musculoskeletal:        General: No swelling or tenderness in peripheral joints  Skin:    Comments: No rashes  Neurological: Awake, alert and oriented, grossly nonfocal  Psychiatric:        Mood and Affect: Mood normal.    Pertinent Microbiology Results for orders placed or performed during the hospital encounter of 10/02/23  Blood Culture (routine x 2)     Status: Abnormal   Collection Time: 10/02/23  5:11 AM   Specimen: BLOOD  Result Value Ref Range Status   Specimen Description   Final    BLOOD RIGHT ANTECUBITAL Performed at [facility], [address], [place], [place] [address]    Special Requests    Final    BOTTLES DRAWN AEROBIC AND ANAEROBIC Blood Culture results may not be optimal due to  an inadequate volume of blood received in culture bottles Performed at [facility], [address] [place], [place] [address]    Culture  Setup Time   Final    GRAM NEGATIVE RODS AEROBIC BOTTLE ONLY CRITICAL RESULT CALLED TO, READ BACK BY AND VERIFIED WITH: Karma Lew [id number] FCP Performed at [facility], [address] [place], [place] [address]    Culture (A)  Final    ESCHERICHIA COLI Confirmed Extended Spectrum Beta-Lactamase Producer (ESBL).  In bloodstream infections from ESBL organisms, carbapenems are preferred over piperacillin/tazobactam. They are shown to have a lower risk of mortality.    Report Status 10/05/2023 FINAL  Final   Organism ID, Bacteria ESCHERICHIA COLI  Final      Susceptibility   Escherichia coli - MIC*    AMPICILLIN >=32 RESISTANT Resistant     CEFEPIME 4 INTERMEDIATE Intermediate     CEFTRIAXONE >=64 RESISTANT Resistant     CIPROFLOXACIN 0.5 INTERMEDIATE Intermediate     GENTAMICIN <=1 SENSITIVE Sensitive     IMIPENEM <=0.25 SENSITIVE Sensitive     TRIMETH/SULFA <=20 SENSITIVE Sensitive     AMPICILLIN/SULBACTAM >=32 RESISTANT Resistant     PIP/TAZO <=4 SENSITIVE Sensitive ug/mL    * ESCHERICHIA COLI  Resp panel by RT-PCR (RSV, Flu A&B, Covid) Anterior Nasal Swab     Status: None   Collection Time: 10/02/23  5:11 AM   Specimen: Anterior Nasal Swab  Result Value Ref Range Status   SARS Coronavirus 2 by RT PCR NEGATIVE NEGATIVE Final    Comment: (NOTE) SARS-CoV-2 target nucleic acids are NOT DETECTED.  The SARS-CoV-2 RNA is generally detectable in upper respiratory specimens during the acute phase of infection. The lowest concentration of SARS-CoV-2 viral copies this assay can detect is 138 copies/mL. A negative result does not preclude SARS-Cov-2 infection and should not be used as the sole basis for treatment or other  patient management decisions. A negative result may occur with  improper specimen collection/handling, submission of specimen other than nasopharyngeal swab, presence of viral mutation(s) within the areas targeted by this assay, and inadequate number of viral copies(<138 copies/mL). A negative result must be combined with clinical observations, patient history, and epidemiological information. The expected result is Negative.  Fact Sheet for Patients:  BloggerCourse.com  Fact Sheet for Healthcare Providers:  SeriousBroker.it  This test is no t yet approved or cleared by the [place] FDA and  has been authorized for detection and/or diagnosis of SARS-CoV-2 by FDA under an Emergency Use Authorization (EUA). This EUA will remain  in effect (meaning this test can be used) for the duration of the COVID-19 declaration under Section 564(b)(1) of the Act, 21 U.S.C.section 360bbb-3(b)(1), unless the authorization is terminated  or revoked sooner.       Influenza A by PCR NEGATIVE NEGATIVE Final   Influenza B by PCR NEGATIVE NEGATIVE Final    Comment: (NOTE) The Xpert Xpress SARS-CoV-2/FLU/RSV plus assay is intended as an aid in the diagnosis of influenza from Nasopharyngeal swab specimens and should not be used as a sole basis for treatment. Nasal washings and aspirates are unacceptable for Xpert Xpress SARS-CoV-2/FLU/RSV testing.  Fact Sheet for Patients: BloggerCourse.com  Fact Sheet for Healthcare Providers: SeriousBroker.it  This test is not yet approved or cleared by the [place] FDA and has been authorized for detection and/or diagnosis of SARS-CoV-2 by FDA under an Emergency Use Authorization (EUA). This EUA will remain in effect (meaning this test can  be used) for the duration of the COVID-19 declaration under Section 564(b)(1) of the Act, 21 U.S.C. section  360bbb-3(b)(1), unless the authorization is terminated or revoked.     Resp Syncytial Virus by PCR NEGATIVE NEGATIVE Final    Comment: (NOTE) Fact Sheet for Patients: BloggerCourse.com  Fact Sheet for Healthcare Providers: SeriousBroker.it  This test is not yet approved or cleared by the [place] FDA and has been authorized for detection and/or diagnosis of SARS-CoV-2 by FDA under an Emergency Use Authorization (EUA). This EUA will remain in effect (meaning this test can be used) for the duration of the COVID-19 declaration under Section 564(b)(1) of the Act, 21 U.S.C. section 360bbb-3(b)(1), unless the authorization is terminated or revoked.  Performed at [facility], [address], [place], [place] [address]   Blood Culture (routine x 2)     Status: Abnormal   Collection Time: 10/02/23  5:30 AM   Specimen: BLOOD LEFT ARM  Result Value Ref Range Status   Specimen Description   Final    BLOOD LEFT ARM Performed at [facility], [address] [place], [place] [address]    Special Requests   Final    BOTTLES DRAWN AEROBIC AND ANAEROBIC Blood Culture results may not be optimal due to an inadequate volume of blood received in culture bottles Performed at [facility], [address], [place], [place] [address]    Culture  Setup Time   Final    GRAM NEGATIVE RODS ANAEROBIC BOTTLE ONLY CRITICAL RESULT CALLED TO, READ BACK BY AND VERIFIED WITH: PHARMD E JACKSON 10/03/2023 @ 0149 BY AB    Culture (A)  Final    ESCHERICHIA COLI SUSCEPTIBILITIES PERFORMED ON PREVIOUS CULTURE WITHIN THE LAST 5 DAYS. Performed at [facility], [address], [place], [place] [address]    Report Status 10/05/2023 FINAL  Final  Blood Culture ID Panel (Reflexed)     Status: Abnormal   Collection Time: 10/02/23  5:30 AM  Result Value Ref Range Status   Enterococcus faecalis NOT DETECTED NOT  DETECTED Final   Enterococcus Faecium NOT DETECTED NOT DETECTED Final   Listeria monocytogenes NOT DETECTED NOT DETECTED Final   Staphylococcus species NOT DETECTED NOT DETECTED Final   Staphylococcus aureus (BCID) NOT DETECTED NOT DETECTED Final   Staphylococcus epidermidis NOT DETECTED NOT DETECTED Final   Staphylococcus lugdunensis NOT DETECTED NOT DETECTED Final   Streptococcus species NOT DETECTED NOT DETECTED Final   Streptococcus agalactiae NOT DETECTED NOT DETECTED Final   Streptococcus pneumoniae NOT DETECTED NOT DETECTED Final   Streptococcus pyogenes NOT DETECTED NOT DETECTED Final   A.calcoaceticus-baumannii NOT DETECTED NOT DETECTED Final   Bacteroides fragilis NOT DETECTED NOT DETECTED Final   Enterobacterales DETECTED (A) NOT DETECTED Final    Comment: Enterobacterales represent a large order of gram negative bacteria, not a single organism. CRITICAL RESULT CALLED TO, READ BACK BY AND VERIFIED WITH: PHARMD E JACKSON 10/03/2023 @ 0149 BY AB    Enterobacter cloacae complex NOT DETECTED NOT DETECTED Final   Escherichia coli DETECTED (A) NOT DETECTED Final    Comment: CRITICAL RESULT CALLED TO, READ BACK BY AND VERIFIED WITH: PHARMD E JACKSON 10/03/2023 @ 0149 BY AB    Klebsiella aerogenes NOT DETECTED NOT DETECTED Final   Klebsiella oxytoca NOT DETECTED NOT DETECTED Final   Klebsiella pneumoniae NOT DETECTED NOT DETECTED Final   Proteus species NOT DETECTED NOT DETECTED Final   Salmonella species NOT DETECTED NOT DETECTED Final  Serratia marcescens NOT DETECTED NOT DETECTED Final   Haemophilus influenzae NOT DETECTED NOT DETECTED Final   Neisseria meningitidis NOT DETECTED NOT DETECTED Final   Pseudomonas aeruginosa NOT DETECTED NOT DETECTED Final   Stenotrophomonas maltophilia NOT DETECTED NOT DETECTED Final   Candida albicans NOT DETECTED NOT DETECTED Final   Candida auris NOT DETECTED NOT DETECTED Final   Candida glabrata NOT DETECTED NOT DETECTED Final   Candida  krusei NOT DETECTED NOT DETECTED Final   Candida parapsilosis NOT DETECTED NOT DETECTED Final   Candida tropicalis NOT DETECTED NOT DETECTED Final   Cryptococcus neoformans/gattii NOT DETECTED NOT DETECTED Final   CTX-M ESBL DETECTED (A) NOT DETECTED Final    Comment: CRITICAL RESULT CALLED TO, READ BACK BY AND VERIFIED WITH: PHARMD E JACKSON 10/03/2023 @ 0149 BY AB (NOTE) Extended spectrum beta-lactamase detected. Recommend a carbapenem as initial therapy.      Carbapenem resistance IMP NOT DETECTED NOT DETECTED Final   Carbapenem resistance KPC NOT DETECTED NOT DETECTED Final   Carbapenem resistance NDM NOT DETECTED NOT DETECTED Final   Carbapenem resist OXA 48 LIKE NOT DETECTED NOT DETECTED Final   Carbapenem resistance VIM NOT DETECTED NOT DETECTED Final    Comment: Performed at [facility], [address], [place], [place] [address]  Urine Culture     Status: Abnormal   Collection Time: 10/02/23  6:40 AM   Specimen: Urine, Random  Result Value Ref Range Status   Specimen Description   Final    URINE, RANDOM Performed at [facility], [address], [place], [place] [address]    Special Requests   Final    NONE Reflexed from [id number] Performed at [facility], [address] [place], [place] [address]    Culture (A)  Final    <10,000 COLONIES/mL INSIGNIFICANT GROWTH Performed at [facility], [address] [place], [place] [address]    Report Status 10/03/2023 FINAL  Final   Pertinent Lab seen by me:    Latest Ref Rng & Units 10/03/2023    3:49 AM 10/02/2023    4:40 AM 09/10/2020    4:46 AM  CBC  WBC 4.0 - 10.5 K/uL 6.7  14.1  6.6   Hemoglobin 12.0 - 15.0 g/dL 63.0  16.0  10.9   Hematocrit 36.0 - 46.0 % 36.3  37.7  41.5   Platelets 150 - 400 K/uL 196  237  263       Latest Ref Rng & Units 10/03/2023    3:49 AM 10/02/2023    4:40 AM 09/09/2020    5:16 AM  CMP  Glucose 70 - 99 mg/dL 86  323  557   BUN 6 - 20 mg/dL 12   16  11    Creatinine 0.44 - 1.00 mg/dL 3.22  0.25  4.27   Sodium 135 - 145 mmol/L 138  135  139   Potassium 3.5 - 5.1 mmol/L 3.8  3.2  4.0   Chloride 98 - 111 mmol/L 106  101  103   CO2 22 - 32 mmol/L 24  23  25    Calcium 8.9 - 10.3 mg/dL 8.3  8.4  8.6   Total Protein 6.5 - 8.1 g/dL 6.2  7.1    Total Bilirubin 0.0 - 1.2 mg/dL 0.4  1.0    Alkaline Phos 38 - 126 U/L 56  47    AST 15 - 41 U/L 41  19    ALT 0 - 44  U/L 22  13      Pertinent Imagings/Other Imagings Plain films and CT images have been personally visualized and interpreted; radiology reports have been reviewed. Decision making incorporated into the Impression / Recommendations.  ECHOCARDIOGRAM COMPLETE Result Date: 10/03/2023    ECHOCARDIOGRAM REPORT   Patient Name:   CHANTERIA HAGGARD Date of Exam: 10/03/2023 Medical Rec #:  [id number]          Height:       67.0 in Accession #:    [id number]         Weight:       150.0 lb Date of Birth:  07/08/1965          BSA:          1.790 m Patient Age:    58 years           BP:           151/81 mmHg Patient Gender: F                  HR:           80 bpm. Exam Location:  Inpatient Procedure: 2D Echo, Cardiac Doppler and Color Doppler (Both Spectral and Color            Flow Doppler were utilized during procedure). Indications:    Abnormal ECG  History:        Patient has no prior history of Echocardiogram examinations.  Sonographer:    Lamont Snowball Referring Phys: [id number] DAVID MANUEL ORTIZ IMPRESSIONS  1. Left ventricular ejection fraction, by estimation, is 55 to 60%. The left ventricle has normal function. The left ventricle has no regional wall motion abnormalities. There is mild asymmetric left ventricular hypertrophy of the septal segment. Left ventricular diastolic parameters are consistent with Grade I diastolic dysfunction (impaired relaxation).  2. Right ventricular systolic function is normal. The right ventricular size is normal. The estimated right ventricular systolic pressure is 26.2  mmHg.  3. Mild bileaflet mitral valve prolapse. The mitral valve is degenerative. Mild mitral valve regurgitation. No evidence of mitral stenosis.  4. The aortic valve is tricuspid. There is mild calcification of the aortic valve. Aortic valve regurgitation is trivial. No aortic stenosis is present.  5. The inferior vena cava is normal in size with greater than 50% respiratory variability, suggesting right atrial pressure of 3 mmHg. FINDINGS  Left Ventricle: Left ventricular ejection fraction, by estimation, is 55 to 60%. The left ventricle has normal function. The left ventricle has no regional wall motion abnormalities. The left ventricular internal cavity size was normal in size. There is  mild asymmetric left ventricular hypertrophy of the septal segment. Left ventricular diastolic parameters are consistent with Grade I diastolic dysfunction (impaired relaxation). Right Ventricle: The right ventricular size is normal. No increase in right ventricular wall thickness. Right ventricular systolic function is normal. The tricuspid regurgitant velocity is 2.41 m/s, and with an assumed right atrial pressure of 3 mmHg, the estimated right ventricular systolic pressure is 26.2 mmHg. Left Atrium: Left atrial size was normal in size. Right Atrium: Right atrial size was normal in size. Pericardium: There is no evidence of pericardial effusion. Mitral Valve: Mild bileaflet mitral valve prolapse. The mitral valve is degenerative in appearance. Mild to moderate mitral annular calcification. Mild mitral valve regurgitation. No evidence of mitral valve stenosis. MV peak gradient, 4.8 mmHg. The mean  mitral valve gradient is 3.0 mmHg. Tricuspid Valve: The tricuspid valve is normal in structure. Tricuspid  valve regurgitation is mild . No evidence of tricuspid stenosis. Aortic Valve: The aortic valve is tricuspid. There is mild calcification of the aortic valve. Aortic valve regurgitation is trivial. No aortic stenosis is present.  Aortic valve peak gradient measures 7.8 mmHg. Pulmonic Valve: The pulmonic valve was normal in structure. Pulmonic valve regurgitation is trivial. No evidence of pulmonic stenosis. Aorta: The aortic root is normal in size and structure. Venous: The inferior vena cava is normal in size with greater than 50% respiratory variability, suggesting right atrial pressure of 3 mmHg. IAS/Shunts: No atrial level shunt detected by color flow Doppler.  LEFT VENTRICLE PLAX 2D LVIDd:         4.30 cm   Diastology LVIDs:         2.70 cm   LV e' medial:    7.51 cm/s LV PW:         0.80 cm   LV E/e' medial:  14.4 LV IVS:        1.00 cm   LV e' lateral:   10.10 cm/s LVOT diam:     2.10 cm   LV E/e' lateral: 10.7 LV SV:         72 LV SV Index:   40 LVOT Area:     3.46 cm  RIGHT VENTRICLE             IVC RV Basal diam:  3.40 cm     IVC diam: 2.00 cm RV S prime:     15.40 cm/s TAPSE (M-mode): 2.0 cm LEFT ATRIUM             Index        RIGHT ATRIUM           Index LA Vol (A2C):   48.0 ml 26.82 ml/m  RA Area:     12.50 cm LA Vol (A4C):   44.3 ml 24.76 ml/m  RA Volume:   28.70 ml  16.04 ml/m LA Biplane Vol: 47.2 ml 26.38 ml/m  AORTIC VALVE AV Area (Vmax): 2.52 cm AV Vmax:        140.00 cm/s AV Peak Grad:   7.8 mmHg LVOT Vmax:      102.00 cm/s LVOT Vmean:     75.800 cm/s LVOT VTI:       0.207 m  AORTA Ao Root diam: 3.10 cm Ao Asc diam:  3.40 cm MITRAL VALVE                TRICUSPID VALVE MV Area (PHT): 5.88 cm     TR Peak grad:   23.2 mmHg MV Area VTI:   2.70 cm     TR Vmax:        241.00 cm/s MV Peak grad:  4.8 mmHg MV Mean grad:  3.0 mmHg     SHUNTS MV Vmax:       1.09 m/s     Systemic VTI:  0.21 m MV Vmean:      75.6 cm/s    Systemic Diam: 2.10 cm MV Decel Time: 129 msec MV E velocity: 108.00 cm/s MV A velocity: 114.00 cm/s MV E/A ratio:  0.95 Weston Brass MD Electronically signed by Weston Brass MD Signature Date/Time: 10/03/2023/5:41:55 PM    Final    CT Renal Stone Study Result Date: 10/02/2023 CLINICAL DATA:   58 year old female with abdomen and flank pain. History of kidney stones. EXAM: CT ABDOMEN AND PELVIS WITHOUT CONTRAST TECHNIQUE: Multidetector CT imaging of the abdomen and pelvis was performed following  the standard protocol without IV contrast. RADIATION DOSE REDUCTION: This exam was performed according to the departmental dose-optimization program which includes automated exposure control, adjustment of the mA and/or kV according to patient size and/or use of iterative reconstruction technique. COMPARISON:  CT Abdomen and Pelvis 06/28/2023. FINDINGS: Lower chest: Heart size remains normal.  Negative lung bases. Hepatobiliary: Negative noncontrast liver and gallbladder. Pancreas: Negative. Spleen: Negative. Adrenals/Urinary Tract: Negative adrenal glands. Noncontrast right kidney appears stable since December. No right nephrolithiasis. Prominent right renal pelvis but decompressed right ureter which seems to remain diminutive to the bladder. Right pelvic phleboliths appear stable. Increased left renal pelvis size. Small 3 mm left lower pole calculus now. Left periureteral stranding, but no left hydroureter. And the distal left ureter in the pelvis seems to remain normal. No convincing ureteral calculus. Left hemipelvis phleboliths are stable. Negative urinary bladder, no stone within the bladder. Stomach/Bowel: Redundant sigmoid colon at the pelvic inlet with retained gas and stool. Diverticulosis in the upstream descending colon, no definite active inflammation there. Negative transverse and right colon. Normal gas containing appendix series 2, image 60. Nondilated small bowel. Chronic gastric bypass. Downstream small bowel anastomosis in the left abdomen series 2, image 30 appears stable. No pneumoperitoneum, free fluid, convincing mesenteric inflammation. Vascular/Lymphatic: Normal caliber abdominal aorta. Mild Aortoiliac calcified atherosclerosis. No lymphadenopathy identified. Reproductive: Surgically absent  uterus. Diminutive or absent ovaries. Other: No pelvis free fluid. Musculoskeletal: Lumbar disc and endplate degeneration with occasional vacuum disc. No acute osseous abnormality identified. IMPRESSION: 1. Persistent left nephrolithiasis, both renal pelves slightly larger since December, and mild left periureteral stranding. But no urinary calculus identified. Query recently passed stone. However, UTI or urinary infection not excluded. 2. No other acute or inflammatory process identified in the noncontrast abdomen or pelvis. 3. 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 Electronically Signed   By: Odessa Fleming M.D.   On: 10/02/2023 06:42   DG Chest Port 1 View Result Date: 10/02/2023 CLINICAL DATA:  58 year old female with possible sepsis. EXAM: PORTABLE CHEST 1 VIEW COMPARISON:  Chest CT 05/30/2023 and earlier. FINDINGS: Portable AP semi upright view at 0532 hours. Low normal lung volumes. Normal cardiac size and mediastinal contours. Visualized tracheal air column is within normal limits. Allowing for portable technique the lungs are clear. No pneumothorax or pleural effusion. Chronic cervical ACDF and thyroidectomy. Negative visible bowel gas. No acute osseous abnormality identified. IMPRESSION: No acute cardiopulmonary abnormality. Electronically Signed   By: Odessa Fleming M.D.   On: 10/02/2023 06:17    I have personally spent 85 minutes involved in face-to-face and non-face-to-face activities for this patient on the day of the visit. Professional time spent includes the following activities: Preparing to see the patient (review of tests), Obtaining and/or reviewing separately obtained history (admission/discharge record), Performing a medically appropriate examination and/or evaluation , Ordering medications/tests/procedures, referring and communicating with other health care professionals, Documenting clinical information in the EMR, Independently interpreting results (not separately reported), Communicating  results to the patient/family/caregiver, Counseling and educating the patient/family/caregiver and Care coordination (not separately reported).  Electronically signed by:   Plan d/w requesting provider as well as ID pharm D  Of note, portions of this note may have been created with voice recognition software. While this note has been edited for accuracy, occasional wrong-word or 'sound-a-like' substitutions may have occurred due to the inherent limitations of voice recognition software.   Odette Fraction, MD Infectious Disease Physician [facility] Pager: [phone]

## 2023-10-05 NOTE — Progress Notes (Signed)
 PROGRESS NOTE Tammy Wilson  NWG:[id number] DOB: February 24, 1966 DOA: 10/02/2023 PCP: Audie Pinto, FNP  Brief Narrative/Hospital Course:  58 y.o. female with medical history significant of anxiety, depression, B12 deficiency, history of obesity, history of gastric bypass surgery, skin cancer, GERD, history of nephrolithiasis and bilateral hydronephrosis, hypothyroidism, sarcoidosis, pulmonary nodules who presented to the ED  W/  complaints of dizziness, headache, mild dysuria and left flank pain after she spent significant portion of the day working outside in her yard.  She was recently started on ciprofloxacin for UTI, but has only taken 1 dose.  In the ED afebrile vitals otherwise stable. Labs concerning for UTI with leukocytosis. Chest x-ray no acute findings CT abdomen pelvis without contrast persistent left nephrolithiasis,both renal pelvis slightly larger since December, and mild left periureteral stranding, but no urinary calculus identified.  No other acute or inflammatory process identified received normal saline bolus Toradol ceftriaxone  AND admitted for pyelonephritis  Subjective: Seen and examined Resting comfortably some flank pain but no fever chills nausea vomiting " I still smell my urine" Overnight afebrile BP stable Labs fairly stable CBC CMP this morning   Assessment and plan:  Pyelonephritis ctx-m ESBL E. Coli sepsis POA: Patient met sepsis criteria tachycardia 106, leukocytosis and pyelonephritis on admission.  Hemodynamically stable lactic acid normal.  ID consulted discussed changing to ertapenem every 24 hours based on culture sensitivity, repeat blood culture ordered today if negative tomorrow will place midline and okay for discharge home .TTE looks stable.  Hypokalemia Hypomagnesemia: Resolved.  Prolonged QT interval: Monitor qtc  Mild protein malnutrition  Augment diet as tolerated   Hyperglycemia: Blood sugar stable   Anxiety Moderate major  depression : Mood stable, continue trazodone lorazepam 0.5 mg p.o. every 8 hr prn, bupropion 300 mg p.o  GERD: Stable, cont on Antiacid, H2 blocker or PPI as needed.   Postoperative hypothyroidism Continue with levthyroxine 112 mcg p.o. daily.   Gastric bypass status for obesity BMI is normal now.     DVT prophylaxis: enoxaparin (LOVENOX) injection 40 mg Start: 10/02/23 2200 Code Status:   Code Status: Full Code Family Communication: plan of care discussed with patient/husband at bedside. Patient status is: Remains hospitalized because of severity of illness Level of care: Telemetry   Dispo: The patient is from: home            Anticipated disposition: home tomorrow if blood cx negative  Objective: Vitals last 24 hrs: Vitals:   10/04/23 0648 10/04/23 1304 10/04/23 2018 10/05/23 0514  BP: (!) 158/92 (!) 146/83 136/86 126/65  Pulse: 89 75 83 65  Resp: 16 20 20 16   Temp: 98 F (36.7 C) 98.6 F (37 C) 98.4 F (36.9 C) 98 F (36.7 C)  TempSrc: Oral Oral Oral   SpO2: 96% 98% 98% 98%  Weight:      Height:       Weight change:   Physical Examination: General exam: alert awake, oriented HEENT:Oral mucosa moist, Ear/Nose WNL grossly Respiratory system: Bilaterally clear BS,no use of accessory muscle Cardiovascular system: S1 & S2 +, No JVD. Gastrointestinal system: Abdomen soft,NT,ND, BS+ Nervous System: Alert, awake, moving all extremities,and following commands. Extremities: LE edema neg,distal peripheral pulses palpable and warm.  Skin: No rashes,no icterus. MSK: Normal muscle bulk,tone, power   Medications reviewed:  Scheduled Meds:  buPROPion  300 mg Oral Daily   enoxaparin (LOVENOX) injection  40 mg Subcutaneous Q24H   ketorolac  15 mg Intravenous Q6H   levothyroxine  112 mcg  Oral QHS   pantoprazole  40 mg Oral Daily   senna-docusate  1 tablet Oral BID   traZODone  50 mg Oral QHS   venlafaxine XR  150 mg Oral Daily   vitamin B-12  500 mcg Oral Daily    Continuous Infusions:  ertapenem        Diet Order             Diet regular Room service appropriate? Yes; Fluid consistency: Thin  Diet effective now                           No intake or output data in the 24 hours ending 10/05/23 1116  Net IO Since Admission: 3,297.12 mL [10/05/23 1116]  Wt Readings from Last 3 Encounters:  10/02/23 68 kg  05/10/23 82.6 kg  04/27/23 84.8 kg     Unresulted Labs (From admission, onward)     Start     Ordered   10/05/23 1012  Culture, blood (Routine X 2) w Reflex to ID Panel  BLOOD CULTURE X 2,   R (with TIMED occurrences)      10/05/23 1011          Data Reviewed: I have personally reviewed following labs and imaging studies ( see epic result tab) CBC: Recent Labs  Lab 10/02/23 0440 10/03/23 0349  WBC 14.1* 6.7  NEUTROABS 11.9*  --   HGB 12.3 11.4*  HCT 37.7 36.3  MCV 89.8 93.3  PLT 237 196   CMP: Recent Labs  Lab 10/02/23 0440 10/03/23 0349  NA 135 138  K 3.2* 3.8  CL 101 106  CO2 23 24  GLUCOSE 104* 86  BUN 16 12  CREATININE 0.87 0.62  CALCIUM 8.4* 8.3*  MG 1.5*  --   PHOS 3.6  --    GFR: Estimated Creatinine Clearance: 75.4 mL/min (by C-G formula based on SCr of 0.62 mg/dL). Recent Labs  Lab 10/02/23 0440 10/03/23 0349  AST 19 41  ALT 13 22  ALKPHOS 47 56  BILITOT 1.0 0.4  PROT 7.1 6.2*  ALBUMIN 3.4* 2.8*   Recent Labs  Lab 10/02/23 0528  LATICACIDVEN 0.8   Recent Results (from the past 240 hours)  Blood Culture (routine x 2)     Status: Abnormal   Collection Time: 10/02/23  5:11 AM   Specimen: BLOOD  Result Value Ref Range Status   Specimen Description   Final    BLOOD RIGHT ANTECUBITAL Performed at [facility], [address] [place], [place] [address]    Special Requests   Final    BOTTLES DRAWN AEROBIC AND ANAEROBIC Blood Culture results may not be optimal due to an inadequate volume of blood received in culture bottles Performed at [facility], [address], [place], [place] [address]    Culture  Setup Time   Final    GRAM NEGATIVE RODS AEROBIC BOTTLE ONLY CRITICAL RESULT CALLED TO, READ BACK BY AND VERIFIED WITH: Karma Lew [id number] FCP Performed at [facility], [address], [place], [place] [address]    Culture (A)  Final    ESCHERICHIA COLI Confirmed Extended Spectrum Beta-Lactamase Producer (ESBL).  In bloodstream infections from ESBL organisms, carbapenems are preferred over piperacillin/tazobactam. They are shown to have a lower risk of mortality.    Report Status 10/05/2023 FINAL  Final   Organism ID, Bacteria ESCHERICHIA COLI  Final  Susceptibility   Escherichia coli - MIC*    AMPICILLIN >=32 RESISTANT Resistant     CEFEPIME 4 INTERMEDIATE Intermediate     CEFTRIAXONE >=64 RESISTANT Resistant     CIPROFLOXACIN 0.5 INTERMEDIATE Intermediate     GENTAMICIN <=1 SENSITIVE Sensitive     IMIPENEM <=0.25 SENSITIVE Sensitive     TRIMETH/SULFA <=20 SENSITIVE Sensitive     AMPICILLIN/SULBACTAM >=32 RESISTANT Resistant     PIP/TAZO <=4 SENSITIVE Sensitive ug/mL    * ESCHERICHIA COLI  Resp panel by RT-PCR (RSV, Flu A&B, Covid) Anterior Nasal Swab     Status: None   Collection Time: 10/02/23  5:11 AM   Specimen: Anterior Nasal Swab  Result Value Ref Range Status   SARS Coronavirus 2 by RT PCR NEGATIVE NEGATIVE Final    Comment: (NOTE) SARS-CoV-2 target nucleic acids are NOT DETECTED.  The SARS-CoV-2 RNA is generally detectable in upper respiratory specimens during the acute phase of infection. The lowest concentration of SARS-CoV-2 viral copies this assay can detect is 138 copies/mL. A negative result does not preclude SARS-Cov-2 infection and should not be used as the sole basis for treatment or other patient management decisions. A negative result may occur with  improper specimen collection/handling, submission of specimen other than nasopharyngeal swab, presence of viral mutation(s)  within the areas targeted by this assay, and inadequate number of viral copies(<138 copies/mL). A negative result must be combined with clinical observations, patient history, and epidemiological information. The expected result is Negative.  Fact Sheet for Patients:  BloggerCourse.com  Fact Sheet for Healthcare Providers:  SeriousBroker.it  This test is no t yet approved or cleared by the [place] FDA and  has been authorized for detection and/or diagnosis of SARS-CoV-2 by FDA under an Emergency Use Authorization (EUA). This EUA will remain  in effect (meaning this test can be used) for the duration of the COVID-19 declaration under Section 564(b)(1) of the Act, 21 U.S.C.section 360bbb-3(b)(1), unless the authorization is terminated  or revoked sooner.       Influenza A by PCR NEGATIVE NEGATIVE Final   Influenza B by PCR NEGATIVE NEGATIVE Final    Comment: (NOTE) The Xpert Xpress SARS-CoV-2/FLU/RSV plus assay is intended as an aid in the diagnosis of influenza from Nasopharyngeal swab specimens and should not be used as a sole basis for treatment. Nasal washings and aspirates are unacceptable for Xpert Xpress SARS-CoV-2/FLU/RSV testing.  Fact Sheet for Patients: BloggerCourse.com  Fact Sheet for Healthcare Providers: SeriousBroker.it  This test is not yet approved or cleared by the [place] FDA and has been authorized for detection and/or diagnosis of SARS-CoV-2 by FDA under an Emergency Use Authorization (EUA). This EUA will remain in effect (meaning this test can be used) for the duration of the COVID-19 declaration under Section 564(b)(1) of the Act, 21 U.S.C. section 360bbb-3(b)(1), unless the authorization is terminated or revoked.     Resp Syncytial Virus by PCR NEGATIVE NEGATIVE Final    Comment: (NOTE) Fact Sheet for  Patients: BloggerCourse.com  Fact Sheet for Healthcare Providers: SeriousBroker.it  This test is not yet approved or cleared by the [place] FDA and has been authorized for detection and/or diagnosis of SARS-CoV-2 by FDA under an Emergency Use Authorization (EUA). This EUA will remain in effect (meaning this test can be used) for the duration of the COVID-19 declaration under Section 564(b)(1) of the Act, 21 U.S.C. section 360bbb-3(b)(1), unless the authorization is terminated or revoked.  Performed at [facility], [address] [place], [place] [address]   Blood Culture (routine x 2)     Status: Abnormal   Collection Time: 10/02/23  5:30 AM   Specimen: BLOOD LEFT ARM  Result Value Ref Range Status   Specimen Description   Final    BLOOD LEFT ARM Performed at [facility], [address] [place], [place] [address]    Special Requests   Final    BOTTLES DRAWN AEROBIC AND ANAEROBIC Blood Culture results may not be optimal due to an inadequate volume of blood received in culture bottles Performed at [facility], [address] [place], [place] [address]    Culture  Setup Time   Final    GRAM NEGATIVE RODS ANAEROBIC BOTTLE ONLY CRITICAL RESULT CALLED TO, READ BACK BY AND VERIFIED WITH: PHARMD E JACKSON 10/03/2023 @ 0149 BY AB    Culture (A)  Final    ESCHERICHIA COLI SUSCEPTIBILITIES PERFORMED ON PREVIOUS CULTURE WITHIN THE LAST 5 DAYS. Performed at [facility], [address], [place], [place] [address]    Report Status 10/05/2023 FINAL  Final  Blood Culture ID Panel (Reflexed)     Status: Abnormal   Collection Time: 10/02/23  5:30 AM  Result Value Ref Range Status   Enterococcus faecalis NOT DETECTED NOT DETECTED Final   Enterococcus Faecium NOT DETECTED NOT DETECTED Final   Listeria monocytogenes NOT DETECTED NOT DETECTED Final   Staphylococcus species NOT  DETECTED NOT DETECTED Final   Staphylococcus aureus (BCID) NOT DETECTED NOT DETECTED Final   Staphylococcus epidermidis NOT DETECTED NOT DETECTED Final   Staphylococcus lugdunensis NOT DETECTED NOT DETECTED Final   Streptococcus species NOT DETECTED NOT DETECTED Final   Streptococcus agalactiae NOT DETECTED NOT DETECTED Final   Streptococcus pneumoniae NOT DETECTED NOT DETECTED Final   Streptococcus pyogenes NOT DETECTED NOT DETECTED Final   A.calcoaceticus-baumannii NOT DETECTED NOT DETECTED Final   Bacteroides fragilis NOT DETECTED NOT DETECTED Final   Enterobacterales DETECTED (A) NOT DETECTED Final    Comment: Enterobacterales represent a large order of gram negative bacteria, not a single organism. CRITICAL RESULT CALLED TO, READ BACK BY AND VERIFIED WITH: PHARMD E JACKSON 10/03/2023 @ 0149 BY AB    Enterobacter cloacae complex NOT DETECTED NOT DETECTED Final   Escherichia coli DETECTED (A) NOT DETECTED Final    Comment: CRITICAL RESULT CALLED TO, READ BACK BY AND VERIFIED WITH: PHARMD E JACKSON 10/03/2023 @ 0149 BY AB    Klebsiella aerogenes NOT DETECTED NOT DETECTED Final   Klebsiella oxytoca NOT DETECTED NOT DETECTED Final   Klebsiella pneumoniae NOT DETECTED NOT DETECTED Final   Proteus species NOT DETECTED NOT DETECTED Final   Salmonella species NOT DETECTED NOT DETECTED Final   Serratia marcescens NOT DETECTED NOT DETECTED Final   Haemophilus influenzae NOT DETECTED NOT DETECTED Final   Neisseria meningitidis NOT DETECTED NOT DETECTED Final   Pseudomonas aeruginosa NOT DETECTED NOT DETECTED Final   Stenotrophomonas maltophilia NOT DETECTED NOT DETECTED Final   Candida albicans NOT DETECTED NOT DETECTED Final   Candida auris NOT DETECTED NOT DETECTED Final   Candida glabrata NOT DETECTED NOT DETECTED Final   Candida krusei NOT DETECTED NOT DETECTED Final   Candida parapsilosis NOT DETECTED NOT DETECTED Final   Candida tropicalis NOT DETECTED NOT DETECTED Final   Cryptococcus  neoformans/gattii NOT DETECTED NOT DETECTED Final   CTX-M ESBL DETECTED (A) NOT DETECTED Final    Comment: CRITICAL RESULT CALLED TO, READ BACK BY AND VERIFIED WITH: PHARMD  E JACKSON 10/03/2023 @ 0149 BY AB (NOTE) Extended spectrum beta-lactamase detected. Recommend a carbapenem as initial therapy.      Carbapenem resistance IMP NOT DETECTED NOT DETECTED Final   Carbapenem resistance KPC NOT DETECTED NOT DETECTED Final   Carbapenem resistance NDM NOT DETECTED NOT DETECTED Final   Carbapenem resist OXA 48 LIKE NOT DETECTED NOT DETECTED Final   Carbapenem resistance VIM NOT DETECTED NOT DETECTED Final    Comment: Performed at [facility], [address], [place], [place] [address]  Urine Culture     Status: Abnormal   Collection Time: 10/02/23  6:40 AM   Specimen: Urine, Random  Result Value Ref Range Status   Specimen Description   Final    URINE, RANDOM Performed at [facility], [address], [place], [place] [address]    Special Requests   Final    NONE Reflexed from [id number] Performed at [facility], [address], [place], [place] [address]    Culture (A)  Final    <10,000 COLONIES/mL INSIGNIFICANT GROWTH Performed at [facility], [address] [place], [place] [address]    Report Status 10/03/2023 FINAL  Final     Antimicrobials/Microbiology: Anti-infectives (From admission, onward)    Start     Dose/Rate Route Frequency Ordered Stop   10/05/23 1115  ertapenem (INVANZ) 1 g in sodium chloride 0.9 % 100 mL IVPB        1 g 200 mL/hr over 30 Minutes Intravenous Every 24 hours 10/05/23 1029     10/05/23 0000  ertapenem (INVANZ) IVPB        1 g Intravenous Every 24 hours 10/05/23 1039 10/12/23 2359   10/03/23 1000  cefTRIAXone (ROCEPHIN) 1 g in sodium chloride 0.9 % 100 mL IVPB  Status:  Discontinued        1 g 200 mL/hr over 30 Minutes Intravenous Every 24 hours 10/02/23 0753 10/03/23 0157   10/03/23 0230  meropenem  (MERREM) 1 g in sodium chloride 0.9 % 100 mL IVPB  Status:  Discontinued        1 g 200 mL/hr over 30 Minutes Intravenous Every 8 hours 10/03/23 0157 10/05/23 1029   10/02/23 0700  cefTRIAXone (ROCEPHIN) 1 g in sodium chloride 0.9 % 100 mL IVPB        1 g 200 mL/hr over 30 Minutes Intravenous  Once 10/02/23 0654 10/02/23 0740         Component Value Date/Time   SDES  10/02/2023 0640    URINE, RANDOM Performed at [facility], [address], [place], [place] [address]    SPECREQUEST  10/02/2023 0640    NONE Reflexed from [id number] Performed at [facility], [address], [place], [place] [address]    CULT (A) 10/02/2023 0640    <10,000 COLONIES/mL INSIGNIFICANT GROWTH Performed at [facility], [address], [place], [place] [address]    REPTSTATUS 10/03/2023 FINAL 10/02/2023 7564     Radiology Studies: ECHOCARDIOGRAM COMPLETE Result Date: 10/03/2023    ECHOCARDIOGRAM REPORT   Patient Name:   SIEARRA AMBERG Date of Exam: 10/03/2023 Medical Rec #:  [id number]          Height:       67.0 in Accession #:    [id number]         Weight:       150.0 lb Date of Birth:  09/22/1965  BSA:          1.790 m Patient Age:    58 years           BP:           151/81 mmHg Patient Gender: F                  HR:           80 bpm. Exam Location:  Inpatient Procedure: 2D Echo, Cardiac Doppler and Color Doppler (Both Spectral and Color            Flow Doppler were utilized during procedure). Indications:    Abnormal ECG  History:        Patient has no prior history of Echocardiogram examinations.  Sonographer:    Lamont Snowball Referring Phys: [id number] DAVID MANUEL ORTIZ IMPRESSIONS  1. Left ventricular ejection fraction, by estimation, is 55 to 60%. The left ventricle has normal function. The left ventricle has no regional wall motion abnormalities. There is mild asymmetric left ventricular hypertrophy of the septal segment. Left ventricular diastolic  parameters are consistent with Grade I diastolic dysfunction (impaired relaxation).  2. Right ventricular systolic function is normal. The right ventricular size is normal. The estimated right ventricular systolic pressure is 26.2 mmHg.  3. Mild bileaflet mitral valve prolapse. The mitral valve is degenerative. Mild mitral valve regurgitation. No evidence of mitral stenosis.  4. The aortic valve is tricuspid. There is mild calcification of the aortic valve. Aortic valve regurgitation is trivial. No aortic stenosis is present.  5. The inferior vena cava is normal in size with greater than 50% respiratory variability, suggesting right atrial pressure of 3 mmHg. FINDINGS  Left Ventricle: Left ventricular ejection fraction, by estimation, is 55 to 60%. The left ventricle has normal function. The left ventricle has no regional wall motion abnormalities. The left ventricular internal cavity size was normal in size. There is  mild asymmetric left ventricular hypertrophy of the septal segment. Left ventricular diastolic parameters are consistent with Grade I diastolic dysfunction (impaired relaxation). Right Ventricle: The right ventricular size is normal. No increase in right ventricular wall thickness. Right ventricular systolic function is normal. The tricuspid regurgitant velocity is 2.41 m/s, and with an assumed right atrial pressure of 3 mmHg, the estimated right ventricular systolic pressure is 26.2 mmHg. Left Atrium: Left atrial size was normal in size. Right Atrium: Right atrial size was normal in size. Pericardium: There is no evidence of pericardial effusion. Mitral Valve: Mild bileaflet mitral valve prolapse. The mitral valve is degenerative in appearance. Mild to moderate mitral annular calcification. Mild mitral valve regurgitation. No evidence of mitral valve stenosis. MV peak gradient, 4.8 mmHg. The mean  mitral valve gradient is 3.0 mmHg. Tricuspid Valve: The tricuspid valve is normal in structure.  Tricuspid valve regurgitation is mild . No evidence of tricuspid stenosis. Aortic Valve: The aortic valve is tricuspid. There is mild calcification of the aortic valve. Aortic valve regurgitation is trivial. No aortic stenosis is present. Aortic valve peak gradient measures 7.8 mmHg. Pulmonic Valve: The pulmonic valve was normal in structure. Pulmonic valve regurgitation is trivial. No evidence of pulmonic stenosis. Aorta: The aortic root is normal in size and structure. Venous: The inferior vena cava is normal in size with greater than 50% respiratory variability, suggesting right atrial pressure of 3 mmHg. IAS/Shunts: No atrial level shunt detected by color flow Doppler.  LEFT VENTRICLE PLAX 2D LVIDd:         4.30 cm  Diastology LVIDs:         2.70 cm   LV e' medial:    7.51 cm/s LV PW:         0.80 cm   LV E/e' medial:  14.4 LV IVS:        1.00 cm   LV e' lateral:   10.10 cm/s LVOT diam:     2.10 cm   LV E/e' lateral: 10.7 LV SV:         72 LV SV Index:   40 LVOT Area:     3.46 cm  RIGHT VENTRICLE             IVC RV Basal diam:  3.40 cm     IVC diam: 2.00 cm RV S prime:     15.40 cm/s TAPSE (M-mode): 2.0 cm LEFT ATRIUM             Index        RIGHT ATRIUM           Index LA Vol (A2C):   48.0 ml 26.82 ml/m  RA Area:     12.50 cm LA Vol (A4C):   44.3 ml 24.76 ml/m  RA Volume:   28.70 ml  16.04 ml/m LA Biplane Vol: 47.2 ml 26.38 ml/m  AORTIC VALVE AV Area (Vmax): 2.52 cm AV Vmax:        140.00 cm/s AV Peak Grad:   7.8 mmHg LVOT Vmax:      102.00 cm/s LVOT Vmean:     75.800 cm/s LVOT VTI:       0.207 m  AORTA Ao Root diam: 3.10 cm Ao Asc diam:  3.40 cm MITRAL VALVE                TRICUSPID VALVE MV Area (PHT): 5.88 cm     TR Peak grad:   23.2 mmHg MV Area VTI:   2.70 cm     TR Vmax:        241.00 cm/s MV Peak grad:  4.8 mmHg MV Mean grad:  3.0 mmHg     SHUNTS MV Vmax:       1.09 m/s     Systemic VTI:  0.21 m MV Vmean:      75.6 cm/s    Systemic Diam: 2.10 cm MV Decel Time: 129 msec MV E velocity: 108.00  cm/s MV A velocity: 114.00 cm/s MV E/A ratio:  0.95 Weston Brass MD Electronically signed by Weston Brass MD Signature Date/Time: 10/03/2023/5:41:55 PM    Final      LOS: 3 days   Total time spent in review of labs and imaging, patient evaluation, formulation of plan, documentation and communication with patient/family:  35 minutes  Lanae Boast, MD [facility] 10/05/2023, 11:16 AM

## 2023-10-05 NOTE — TOC Initial Note (Signed)
 Transition of Care [facility]) - Initial/Assessment Note    Patient Details  Name: Tammy Wilson MRN: [id number] Date of Birth: Feb 24, 1966  Transition of Care [facility]) CM/SW Contact:    Larrie Kass, LCSW Phone Number: 10/05/2023, 2:08 PM  Clinical Narrative:                 pt to d/c home on IV antibiotics. CSW spoke with Pam from [facility]; they are to provide the antibiotics, and [facility] will provide the home health RN. Pam from [facility] will come to the hospital for teaching. TOC to follow   Expected Discharge Plan: Home w Home Health Services Barriers to Discharge: Continued Medical Work up   Patient Goals and CMS Choice            Expected Discharge Plan and Services                                              Prior Living Arrangements/Services                       Activities of Daily Living   ADL Screening (condition at time of admission) Independently performs ADLs?: Yes (appropriate for developmental age) Is the patient deaf or have difficulty hearing?: No Does the patient have difficulty seeing, even when wearing glasses/contacts?: No Does the patient have difficulty concentrating, remembering, or making decisions?: No  Permission Sought/Granted                  Emotional Assessment              Admission diagnosis:  Dehydration [E86.0] Pyelonephritis [N12] Patient Active Problem List   Diagnosis Date Noted   Pyelonephritis 10/02/2023   Hypokalemia 10/02/2023   Prolonged QT interval 10/02/2023   Mild protein malnutrition (HCC) 10/02/2023   Hyperglycemia 10/02/2023   Hypomagnesemia 10/02/2023   Malaise and fatigue 02/22/2022   B12 deficiency 09/09/2021   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   Gastric bypass status for obesity 09/08/2020   S/P gastric bypass 09/08/2020   Mediastinal adenopathy 08/27/2016   Anxiety 08/18/2016   GERD (gastroesophageal reflux disease) 08/18/2016    Hypocalcemia 08/18/2016   Postoperative hypothyroidism 08/18/2016   Lytic bone lesion of hip 08/08/2016   Pulmonary nodules 08/08/2016   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 08/04/2016   Hematuria 07/29/2016   Nephrolithiasis 07/19/2016   Other microscopic hematuria 06/22/2016   Hydronephrosis, bilateral 06/03/2016   S/P laparoscopic sleeve gastrectomy Dec 2016 06/09/2015   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   PCP:  Audie Pinto, FNP Pharmacy:   [facility] [address], [place] - [address] [facility] [place] [address] Phone: [phone] Fax: [phone]     Social Drivers of Health (SDOH) Social History: SDOH Screenings   Food Insecurity: No Food Insecurity (10/02/2023)  Housing: Low Risk  (10/02/2023)  Transportation Needs: No Transportation Needs (10/02/2023)  Utilities: Not At Risk (10/02/2023)  Depression (PHQ2-9): Low Risk  (07/29/2020)  Tobacco Use: Low Risk  (10/02/2023)   SDOH Interventions:     Readmission Risk Interventions     No data to display

## 2023-10-05 NOTE — Progress Notes (Signed)
 PHARMACY CONSULT NOTE FOR:  OUTPATIENT  PARENTERAL ANTIBIOTIC THERAPY (OPAT)  Indication: ESBL E coli bacteremia Regimen: Ertapenem 1 gm every 24 hours  End date: 10/12/23  IV antibiotic discharge orders are pended. To discharging provider:  please sign these orders via discharge navigator,  Select New Orders & click on the button choice - Manage This Unsigned Work.     Thank you for allowing pharmacy to be a part of this patient's care.  Sharin Mons, PharmD, BCPS, BCIDP Infectious Diseases Clinical Pharmacist Phone: [phone] 10/05/2023, 10:37 AM

## 2023-10-06 DIAGNOSIS — N12 Tubulo-interstitial nephritis, not specified as acute or chronic: Secondary | ICD-10-CM | POA: Diagnosis not present

## 2023-10-06 MED ORDER — PROMETHAZINE HCL 12.5 MG PO TABS: 12.5000 mg | ORAL_TABLET | Freq: Three times a day (TID) | ORAL | 0 refills | Status: DC | PRN

## 2023-10-06 NOTE — Plan of Care (Signed)

## 2023-10-06 NOTE — Discharge Summary (Signed)
 Physician Discharge Summary  Tammy Wilson ZOX:[id number] DOB: [birth year] DOA: 10/02/2023  PCP: Audie Pinto, FNP  Admit date: 10/02/2023 Discharge date: 10/06/2023 Recommendations for Outpatient Follow-up:  Follow up with PCP in 1 weeks-call for appointment Please obtain BMP/CBC in one week Follow-up with your urology clinic regarding the kidney stone  Discharge Dispo: home Discharge Condition: Stable Code Status:   Code Status: Full Code Diet recommendation:  Diet Order             Diet regular Room service appropriate? Yes; Fluid consistency: Thin  Diet effective now                    Brief/Interim Summary:  58 y.o. female with medical history significant of anxiety, depression, B12 deficiency, history of obesity, history of gastric bypass surgery, skin cancer, GERD, history of nephrolithiasis and bilateral hydronephrosis, hypothyroidism, sarcoidosis, pulmonary nodules who presented to the ED  W/  complaints of dizziness, headache, mild dysuria and left flank pain after she spent significant portion of the day working outside in her yard.  She was recently started on ciprofloxacin for UTI, but has only taken 1 dose. In the ED afebrile vitals otherwise stable. Labs concerning for UTI with leukocytosis. Chest x-ray no acute findings CT abdomen pelvis without contrast persistent left nephrolithiasis,both renal pelvis slightly larger since December, and mild left periureteral stranding, but no urinary calculus identified.  No other acute or inflammatory process identified received normal saline bolus Toradol ceftriaxone  AND admitted for pyelonephritis. With meropenem patient improved-leukocytosis resolved.  Seen by infectious disease changed to ertapenem and plan to complete 10 days course blood culture repeated 4/9 with plan to discharge on 4/10 if blood culture remains negative with midline She will need to follow-up with urology for kidney stone   Subjective: Seen and examined  this morning afebrile doing well   Discharge diagnosis:   Primary problem Pyelonephritis ctx-m ESBL E. Coli sepsis POA: Patient met sepsis criteria tachycardia 106, leukocytosis and pyelonephritis on admission.  Hemodynamically stable lactic acid normal.  Seen by infectious disease changed to ertapenem and plan to complete 10 days course blood culture repeated 4/9 with plan to discharge on 4/10 if blood culture remains negative with midline. TTE looks stable.  Nephrolithiasis: Patient reports she had previous stone treatment.  CT>persistent left nephrolithiasis Advised to follow-up with urology in the light of #1   Hypokalemia Hypomagnesemia: Resolved.  Prolonged QT interval: Monitor qtc  Mild protein malnutrition  Augment diet as tolerated   Hyperglycemia: Blood sugar stable   Anxiety Moderate major depression : Mood stable, continue trazodone lorazepam 0.5 mg p.o. every 8 hr prn, bupropion 300 mg p.o  GERD: Stable, cont on Antiacid, H2 blocker or PPI as needed.   Postoperative hypothyroidism Continue with levthyroxine 112 mcg p.o. daily.   Gastric bypass status for obesity BMI is normal now.     Discharge Exam: Vitals:   10/06/23 0528 10/06/23 1347  BP: 106/70 129/78  Pulse: 68 74  Resp: 16 19  Temp: 98 F (36.7 C) 98.2 F (36.8 C)  SpO2: 98% 100%   General: Pt is alert, awake, not in acute distress Cardiovascular: RRR, S1/S2 +, no rubs, no gallops Respiratory: CTA bilaterally, no wheezing, no rhonchi Abdominal: Soft, NT, ND, bowel sounds + Extremities: no edema, no cyanosis  Discharge Instructions  Discharge Instructions     [facility] pharmacist to adjust dose for Vancomycin, Aminoglycosides and other anti-infective therapies as requested by physician.  Complete by: As directed    [facility] to provide Cath Flo 2mg    Complete by: As directed    Administer for PICC line occlusion and as ordered by physician for other  access device issues.   Anaphylaxis Kit: Provided to treat any anaphylactic reaction to the medication being provided to the patient if First Dose or when requested by physician   Complete by: As directed    Epinephrine 1mg /ml vial / amp: Administer 0.3mg  (0.51ml) subcutaneously once for moderate to severe anaphylaxis, nurse to call physician and pharmacy when reaction occurs and call 911 if needed for immediate care   Diphenhydramine 50mg /ml IV vial: Administer 25-50mg  IV/IM PRN for first dose reaction, rash, itching, mild reaction, nurse to call physician and pharmacy when reaction occurs   Sodium Chloride 0.9% NS IV: Administer if needed for hypovolemic blood pressure drop or as ordered by physician after call to physician with anaphylactic reaction   Change dressing on IV access line weekly and PRN   Complete by: As directed    Discharge instructions   Complete by: As directed    Please call call MD or return to ER for similar or worsening recurring problem that brought you to hospital or if any fever,nausea/vomiting,abdominal pain, uncontrolled pain, chest pain,  shortness of breath or any other alarming symptoms.  Please follow-up your doctor as instructed in a week time and call the office for appointment.  Please avoid alcohol, smoking, or any other illicit substance and maintain healthy habits including taking your regular medications as prescribed.  You were cared for by a hospitalist during your hospital stay. If you have any questions about your discharge medications or the care you received while you were in the hospital after you are discharged, you can call the unit and ask to speak with the hospitalist on call if the hospitalist that took care of you is not available.  Once you are discharged, your primary care physician will handle any further medical issues. Please note that NO REFILLS for any discharge medications will be authorized once you are discharged, as it is  imperative that you return to your primary care physician (or establish a relationship with a primary care physician if you do not have one) for your aftercare needs so that they can reassess your need for medications and monitor your lab values   Flush IV access with Sodium Chloride 0.9% and Heparin 10 units/ml or 100 units/ml   Complete by: As directed    Home infusion instructions - [facility]   Complete by: As directed    Instructions: Flush IV access with Sodium Chloride 0.9% and Heparin 10units/ml or 100units/ml   Change dressing on IV access line: Weekly and PRN   Instructions Cath Flo 2mg : Administer for PICC Line occlusion and as ordered by physician for other access device   [facility] pharmacist to adjust dose for: Vancomycin, Aminoglycosides and other anti-infective therapies as requested by physician   Increase activity slowly   Complete by: As directed    Method of administration may be changed at the discretion of home infusion pharmacist based upon assessment of the patient and/or caregiver's ability to self-administer the medication ordered   Complete by: As directed    No wound care   Complete by: As directed       Allergies as of 10/06/2023       Reactions   Dilaudid [hydromorphone Hcl] Palpitations   Sulfa Antibiotics Itching  Possible rash         Medication List     STOP taking these medications    amoxicillin-clavulanate 875-125 MG tablet Commonly known as: AUGMENTIN   cefdinir 300 MG capsule Commonly known as: OMNICEF   cephALEXin 500 MG capsule Commonly known as: KEFLEX   ciprofloxacin 500 MG tablet Commonly known as: CIPRO   fluconazole 150 MG tablet Commonly known as: DIFLUCAN       TAKE these medications    buPROPion 300 MG 24 hr tablet Commonly known as: WELLBUTRIN XL Take 300 mg by mouth daily.   ertapenem IVPB Commonly known as: INVANZ Inject 1 g into the vein daily for 7 days. Indication:  ESBL E coli  Bacteremia First Dose: Yes Last Day of Therapy:  10/12/23 Labs - Once weekly:  CBC/D and BMP, Labs - Once weekly: ESR and CRP Method of administration: Mini-Bag Plus / Gravity Method of administration may be changed at the discretion of home infusion pharmacist based upon assessment of the patient and/or caregiver's ability to self-administer the medication ordered.   levothyroxine 112 MCG tablet Commonly known as: SYNTHROID Take 112 mcg by mouth every evening.   LORazepam 0.5 MG tablet Commonly known as: ATIVAN Take 0.5 mg by mouth every 8 (eight) hours as needed for anxiety.   lubiprostone 24 MCG capsule Commonly known as: AMITIZA Take 24 mcg by mouth 2 (two) times daily.   ondansetron 4 MG disintegrating tablet Commonly known as: ZOFRAN-ODT Take 1 tablet (4 mg total) by mouth every 6 (six) hours as needed for nausea or vomiting.   pantoprazole 40 MG tablet Commonly known as: PROTONIX Take 1 tablet (40 mg total) by mouth daily.   promethazine 12.5 MG tablet Commonly known as: PHENERGAN Take 1 tablet (12.5 mg total) by mouth every 8 (eight) hours as needed for vomiting or nausea.   tirzepatide 7.5 MG/0.5ML Pen Commonly known as: MOUNJARO Inject 7.5 mg into the skin once a week.   traZODone 50 MG tablet Commonly known as: DESYREL Take 50 mg by mouth at bedtime.   Trulance 3 MG Tabs Generic drug: Plecanatide Take 1 tablet by mouth daily.   venlafaxine XR 150 MG 24 hr capsule Commonly known as: EFFEXOR-XR Take 150 mg by mouth daily. with food What changed: Another medication with the same name was removed. Continue taking this medication, and follow the directions you see here.   VITAMIN B-12 PO Take 1 tablet by mouth daily.               Discharge Care Instructions  (From admission, onward)           Start     Ordered   10/05/23 0000  Change dressing on IV access line weekly and PRN  (Home infusion instructions - [facility] )         10/05/23 1039            Follow-up Information     Audie Pinto, FNP Follow up in 1 week(s).   Specialty: Family Medicine Contact information: [address] [place] [address] [phone]                Allergies  Allergen Reactions   Dilaudid [Hydromorphone Hcl] Palpitations   Sulfa Antibiotics Itching    Possible rash     The results of significant diagnostics from this hospitalization (including imaging, microbiology, ancillary and laboratory) are listed below for reference.    Microbiology: Recent Results (from  the past 240 hours)  Blood Culture (routine x 2)     Status: Abnormal   Collection Time: 10/02/23  5:11 AM   Specimen: BLOOD  Result Value Ref Range Status   Specimen Description   Final    BLOOD RIGHT ANTECUBITAL Performed at [facility], [address] [place], [place] [address]    Special Requests   Final    BOTTLES DRAWN AEROBIC AND ANAEROBIC Blood Culture results may not be optimal due to an inadequate volume of blood received in culture bottles Performed at [facility], [address], [place], [place] [address]    Culture  Setup Time   Final    GRAM NEGATIVE RODS AEROBIC BOTTLE ONLY CRITICAL RESULT CALLED TO, READ BACK BY AND VERIFIED WITH: Karma Lew [id number] FCP Performed at [facility], [address] [place], [place] [address]    Culture (A)  Final    ESCHERICHIA COLI Confirmed Extended Spectrum Beta-Lactamase Producer (ESBL).  In bloodstream infections from ESBL organisms, carbapenems are preferred over piperacillin/tazobactam. They are shown to have a lower risk of mortality.    Report Status 10/05/2023 FINAL  Final   Organism ID, Bacteria ESCHERICHIA COLI  Final      Susceptibility   Escherichia coli - MIC*    AMPICILLIN >=32 RESISTANT Resistant     CEFEPIME 4 INTERMEDIATE Intermediate     CEFTRIAXONE >=64 RESISTANT Resistant     CIPROFLOXACIN 0.5 INTERMEDIATE  Intermediate     GENTAMICIN <=1 SENSITIVE Sensitive     IMIPENEM <=0.25 SENSITIVE Sensitive     TRIMETH/SULFA <=20 SENSITIVE Sensitive     AMPICILLIN/SULBACTAM >=32 RESISTANT Resistant     PIP/TAZO <=4 SENSITIVE Sensitive ug/mL    * ESCHERICHIA COLI  Resp panel by RT-PCR (RSV, Flu A&B, Covid) Anterior Nasal Swab     Status: None   Collection Time: 10/02/23  5:11 AM   Specimen: Anterior Nasal Swab  Result Value Ref Range Status   SARS Coronavirus 2 by RT PCR NEGATIVE NEGATIVE Final    Comment: (NOTE) SARS-CoV-2 target nucleic acids are NOT DETECTED.  The SARS-CoV-2 RNA is generally detectable in upper respiratory specimens during the acute phase of infection. The lowest concentration of SARS-CoV-2 viral copies this assay can detect is 138 copies/mL. A negative result does not preclude SARS-Cov-2 infection and should not be used as the sole basis for treatment or other patient management decisions. A negative result may occur with  improper specimen collection/handling, submission of specimen other than nasopharyngeal swab, presence of viral mutation(s) within the areas targeted by this assay, and inadequate number of viral copies(<138 copies/mL). A negative result must be combined with clinical observations, patient history, and epidemiological information. The expected result is Negative.  Fact Sheet for Patients:  BloggerCourse.com  Fact Sheet for Healthcare Providers:  SeriousBroker.it  This test is no t yet approved or cleared by the [place] FDA and  has been authorized for detection and/or diagnosis of SARS-CoV-2 by FDA under an Emergency Use Authorization (EUA). This EUA will remain  in effect (meaning this test can be used) for the duration of the COVID-19 declaration under Section 564(b)(1) of the Act, 21 U.S.C.section 360bbb-3(b)(1), unless the authorization is terminated  or revoked sooner.       Influenza A  by PCR NEGATIVE NEGATIVE Final   Influenza B by PCR NEGATIVE NEGATIVE Final    Comment: (NOTE) The Xpert Xpress SARS-CoV-2/FLU/RSV plus assay is intended as an aid  in the diagnosis of influenza from Nasopharyngeal swab specimens and should not be used as a sole basis for treatment. Nasal washings and aspirates are unacceptable for Xpert Xpress SARS-CoV-2/FLU/RSV testing.  Fact Sheet for Patients: BloggerCourse.com  Fact Sheet for Healthcare Providers: SeriousBroker.it  This test is not yet approved or cleared by the [place] FDA and has been authorized for detection and/or diagnosis of SARS-CoV-2 by FDA under an Emergency Use Authorization (EUA). This EUA will remain in effect (meaning this test can be used) for the duration of the COVID-19 declaration under Section 564(b)(1) of the Act, 21 U.S.C. section 360bbb-3(b)(1), unless the authorization is terminated or revoked.     Resp Syncytial Virus by PCR NEGATIVE NEGATIVE Final    Comment: (NOTE) Fact Sheet for Patients: BloggerCourse.com  Fact Sheet for Healthcare Providers: SeriousBroker.it  This test is not yet approved or cleared by the [place] FDA and has been authorized for detection and/or diagnosis of SARS-CoV-2 by FDA under an Emergency Use Authorization (EUA). This EUA will remain in effect (meaning this test can be used) for the duration of the COVID-19 declaration under Section 564(b)(1) of the Act, 21 U.S.C. section 360bbb-3(b)(1), unless the authorization is terminated or revoked.  Performed at [facility], [address] [place], [place] [address]   Blood Culture (routine x 2)     Status: Abnormal   Collection Time: 10/02/23  5:30 AM   Specimen: BLOOD LEFT ARM  Result Value Ref Range Status   Specimen Description   Final    BLOOD LEFT ARM Performed at [facility],  [address] [place], [place] [address]    Special Requests   Final    BOTTLES DRAWN AEROBIC AND ANAEROBIC Blood Culture results may not be optimal due to an inadequate volume of blood received in culture bottles Performed at [facility], [address] [place], [place] [address]    Culture  Setup Time   Final    GRAM NEGATIVE RODS ANAEROBIC BOTTLE ONLY CRITICAL RESULT CALLED TO, READ BACK BY AND VERIFIED WITH: PHARMD E JACKSON 10/03/2023 @ 0149 BY AB    Culture (A)  Final    ESCHERICHIA COLI SUSCEPTIBILITIES PERFORMED ON PREVIOUS CULTURE WITHIN THE LAST 5 DAYS. Performed at [facility], [address] [place], [place] [address]    Report Status 10/05/2023 FINAL  Final  Blood Culture ID Panel (Reflexed)     Status: Abnormal   Collection Time: 10/02/23  5:30 AM  Result Value Ref Range Status   Enterococcus faecalis NOT DETECTED NOT DETECTED Final   Enterococcus Faecium NOT DETECTED NOT DETECTED Final   Listeria monocytogenes NOT DETECTED NOT DETECTED Final   Staphylococcus species NOT DETECTED NOT DETECTED Final   Staphylococcus aureus (BCID) NOT DETECTED NOT DETECTED Final   Staphylococcus epidermidis NOT DETECTED NOT DETECTED Final   Staphylococcus lugdunensis NOT DETECTED NOT DETECTED Final   Streptococcus species NOT DETECTED NOT DETECTED Final   Streptococcus agalactiae NOT DETECTED NOT DETECTED Final   Streptococcus pneumoniae NOT DETECTED NOT DETECTED Final   Streptococcus pyogenes NOT DETECTED NOT DETECTED Final   A.calcoaceticus-baumannii NOT DETECTED NOT DETECTED Final   Bacteroides fragilis NOT DETECTED NOT DETECTED Final   Enterobacterales DETECTED (A) NOT DETECTED Final    Comment: Enterobacterales represent a large order of gram negative bacteria, not a single organism. CRITICAL RESULT CALLED TO, READ BACK BY AND VERIFIED WITH: PHARMD E JACKSON 10/03/2023 @ 0149 BY AB  Enterobacter cloacae complex NOT DETECTED NOT DETECTED Final    Escherichia coli DETECTED (A) NOT DETECTED Final    Comment: CRITICAL RESULT CALLED TO, READ BACK BY AND VERIFIED WITH: PHARMD E JACKSON 10/03/2023 @ 0149 BY AB    Klebsiella aerogenes NOT DETECTED NOT DETECTED Final   Klebsiella oxytoca NOT DETECTED NOT DETECTED Final   Klebsiella pneumoniae NOT DETECTED NOT DETECTED Final   Proteus species NOT DETECTED NOT DETECTED Final   Salmonella species NOT DETECTED NOT DETECTED Final   Serratia marcescens NOT DETECTED NOT DETECTED Final   Haemophilus influenzae NOT DETECTED NOT DETECTED Final   Neisseria meningitidis NOT DETECTED NOT DETECTED Final   Pseudomonas aeruginosa NOT DETECTED NOT DETECTED Final   Stenotrophomonas maltophilia NOT DETECTED NOT DETECTED Final   Candida albicans NOT DETECTED NOT DETECTED Final   Candida auris NOT DETECTED NOT DETECTED Final   Candida glabrata NOT DETECTED NOT DETECTED Final   Candida krusei NOT DETECTED NOT DETECTED Final   Candida parapsilosis NOT DETECTED NOT DETECTED Final   Candida tropicalis NOT DETECTED NOT DETECTED Final   Cryptococcus neoformans/gattii NOT DETECTED NOT DETECTED Final   CTX-M ESBL DETECTED (A) NOT DETECTED Final    Comment: CRITICAL RESULT CALLED TO, READ BACK BY AND VERIFIED WITH: PHARMD E JACKSON 10/03/2023 @ 0149 BY AB (NOTE) Extended spectrum beta-lactamase detected. Recommend a carbapenem as initial therapy.      Carbapenem resistance IMP NOT DETECTED NOT DETECTED Final   Carbapenem resistance KPC NOT DETECTED NOT DETECTED Final   Carbapenem resistance NDM NOT DETECTED NOT DETECTED Final   Carbapenem resist OXA 48 LIKE NOT DETECTED NOT DETECTED Final   Carbapenem resistance VIM NOT DETECTED NOT DETECTED Final    Comment: Performed at [id number], [address] [place], [place] [address]  Urine Culture     Status: Abnormal   Collection Time: 10/02/23  6:40 AM   Specimen: Urine, Random  Result Value Ref Range Status   Specimen Description   Final    URINE,  RANDOM Performed at [facility], [address], [place], [place] [address]    Special Requests   Final    NONE Reflexed from [id number] Performed at [facility], [address], [place], [place] [address]    Culture (A)  Final    <10,000 COLONIES/mL INSIGNIFICANT GROWTH Performed at [facility], [address] [place], [place] [address]    Report Status 10/03/2023 FINAL  Final  Culture, blood (Routine X 2) w Reflex to ID Panel     Status: None (Preliminary result)   Collection Time: 10/05/23 11:10 AM   Specimen: BLOOD RIGHT ARM  Result Value Ref Range Status   Specimen Description   Final    BLOOD RIGHT ARM Performed at [facility], [address] [place], [place] [address]    Special Requests   Final    BOTTLES DRAWN AEROBIC AND ANAEROBIC Blood Culture results may not be optimal due to an inadequate volume of blood received in culture bottles Performed at [facility], [address], [place], [place] [address]    Culture   Final    NO GROWTH < 24 HOURS Performed at [facility], [address], [place], [place] [address]    Report Status PENDING  Incomplete  Culture, blood (Routine X 2) w Reflex to ID Panel     Status: None (Preliminary result)   Collection Time: 10/05/23 11:14 AM  Specimen: BLOOD LEFT HAND  Result Value Ref Range Status   Specimen Description   Final    BLOOD LEFT HAND Performed at [facility], [address] [place], [place] [address]    Special Requests   Final    BOTTLES DRAWN AEROBIC AND ANAEROBIC Blood Culture results may not be optimal due to an inadequate volume of blood received in culture bottles Performed at [facility], [address] [place], [place] [address]    Culture   Final    NO GROWTH < 24 HOURS Performed at [facility], [address] [place], [place] [address]    Report Status PENDING  Incomplete     Procedures/Studies: ECHOCARDIOGRAM COMPLETE Result Date: 10/03/2023    ECHOCARDIOGRAM REPORT   Patient Name:   Tammy Wilson Date of Exam: 10/03/2023 Medical Rec #:  [id number]          Height:       67.0 in Accession #:    [id number]         Weight:       150.0 lb Date of Birth:  05/11/1966          BSA:          1.790 m Patient Age:    58 years           BP:           151/81 mmHg Patient Gender: F                  HR:           80 bpm. Exam Location:  Inpatient Procedure: 2D Echo, Cardiac Doppler and Color Doppler (Both Spectral and Color            Flow Doppler were utilized during procedure). Indications:    Abnormal ECG  History:        Patient has no prior history of Echocardiogram examinations.  Sonographer:    Lamont Snowball Referring Phys: [id number] DAVID MANUEL ORTIZ IMPRESSIONS  1. Left ventricular ejection fraction, by estimation, is 55 to 60%. The left ventricle has normal function. The left ventricle has no regional wall motion abnormalities. There is mild asymmetric left ventricular hypertrophy of the septal segment. Left ventricular diastolic parameters are consistent with Grade I diastolic dysfunction (impaired relaxation).  2. Right ventricular systolic function is normal. The right ventricular size is normal. The estimated right ventricular systolic pressure is 26.2 mmHg.  3. Mild bileaflet mitral valve prolapse. The mitral valve is degenerative. Mild mitral valve regurgitation. No evidence of mitral stenosis.  4. The aortic valve is tricuspid. There is mild calcification of the aortic valve. Aortic valve regurgitation is trivial. No aortic stenosis is present.  5. The inferior vena cava is normal in size with greater than 50% respiratory variability, suggesting right atrial pressure of 3 mmHg. FINDINGS  Left Ventricle: Left ventricular ejection fraction, by estimation, is 55 to 60%. The left ventricle has normal function. The left ventricle has no regional wall motion abnormalities. The left  ventricular internal cavity size was normal in size. There is  mild asymmetric left ventricular hypertrophy of the septal segment. Left ventricular diastolic parameters are consistent with Grade I diastolic dysfunction (impaired relaxation). Right Ventricle: The right ventricular size is normal. No increase in right ventricular wall thickness. Right ventricular systolic function is normal. The tricuspid regurgitant velocity is 2.41 m/s, and with an assumed right atrial pressure of 3 mmHg, the estimated  right ventricular systolic pressure is 26.2 mmHg. Left Atrium: Left atrial size was normal in size. Right Atrium: Right atrial size was normal in size. Pericardium: There is no evidence of pericardial effusion. Mitral Valve: Mild bileaflet mitral valve prolapse. The mitral valve is degenerative in appearance. Mild to moderate mitral annular calcification. Mild mitral valve regurgitation. No evidence of mitral valve stenosis. MV peak gradient, 4.8 mmHg. The mean  mitral valve gradient is 3.0 mmHg. Tricuspid Valve: The tricuspid valve is normal in structure. Tricuspid valve regurgitation is mild . No evidence of tricuspid stenosis. Aortic Valve: The aortic valve is tricuspid. There is mild calcification of the aortic valve. Aortic valve regurgitation is trivial. No aortic stenosis is present. Aortic valve peak gradient measures 7.8 mmHg. Pulmonic Valve: The pulmonic valve was normal in structure. Pulmonic valve regurgitation is trivial. No evidence of pulmonic stenosis. Aorta: The aortic root is normal in size and structure. Venous: The inferior vena cava is normal in size with greater than 50% respiratory variability, suggesting right atrial pressure of 3 mmHg. IAS/Shunts: No atrial level shunt detected by color flow Doppler.  LEFT VENTRICLE PLAX 2D LVIDd:         4.30 cm   Diastology LVIDs:         2.70 cm   LV e' medial:    7.51 cm/s LV PW:         0.80 cm   LV E/e' medial:  14.4 LV IVS:        1.00 cm   LV e'  lateral:   10.10 cm/s LVOT diam:     2.10 cm   LV E/e' lateral: 10.7 LV SV:         72 LV SV Index:   40 LVOT Area:     3.46 cm  RIGHT VENTRICLE             IVC RV Basal diam:  3.40 cm     IVC diam: 2.00 cm RV S prime:     15.40 cm/s TAPSE (M-mode): 2.0 cm LEFT ATRIUM             Index        RIGHT ATRIUM           Index LA Vol (A2C):   48.0 ml 26.82 ml/m  RA Area:     12.50 cm LA Vol (A4C):   44.3 ml 24.76 ml/m  RA Volume:   28.70 ml  16.04 ml/m LA Biplane Vol: 47.2 ml 26.38 ml/m  AORTIC VALVE AV Area (Vmax): 2.52 cm AV Vmax:        140.00 cm/s AV Peak Grad:   7.8 mmHg LVOT Vmax:      102.00 cm/s LVOT Vmean:     75.800 cm/s LVOT VTI:       0.207 m  AORTA Ao Root diam: 3.10 cm Ao Asc diam:  3.40 cm MITRAL VALVE                TRICUSPID VALVE MV Area (PHT): 5.88 cm     TR Peak grad:   23.2 mmHg MV Area VTI:   2.70 cm     TR Vmax:        241.00 cm/s MV Peak grad:  4.8 mmHg MV Mean grad:  3.0 mmHg     SHUNTS MV Vmax:       1.09 m/s     Systemic VTI:  0.21 m MV Vmean:      75.6 cm/s  Systemic Diam: 2.10 cm MV Decel Time: 129 msec MV E velocity: 108.00 cm/s MV A velocity: 114.00 cm/s MV E/A ratio:  0.95 Weston Brass MD Electronically signed by Weston Brass MD Signature Date/Time: 10/03/2023/5:41:55 PM    Final    CT Renal Stone Study Result Date: 10/02/2023 CLINICAL DATA:  58 year old female with abdomen and flank pain. History of kidney stones. EXAM: CT ABDOMEN AND PELVIS WITHOUT CONTRAST TECHNIQUE: Multidetector CT imaging of the abdomen and pelvis was performed following the standard protocol without IV contrast. RADIATION DOSE REDUCTION: This exam was performed according to the departmental dose-optimization program which includes automated exposure control, adjustment of the mA and/or kV according to patient size and/or use of iterative reconstruction technique. COMPARISON:  CT Abdomen and Pelvis 06/28/2023. FINDINGS: Lower chest: Heart size remains normal.  Negative lung bases. Hepatobiliary:  Negative noncontrast liver and gallbladder. Pancreas: Negative. Spleen: Negative. Adrenals/Urinary Tract: Negative adrenal glands. Noncontrast right kidney appears stable since December. No right nephrolithiasis. Prominent right renal pelvis but decompressed right ureter which seems to remain diminutive to the bladder. Right pelvic phleboliths appear stable. Increased left renal pelvis size. Small 3 mm left lower pole calculus now. Left periureteral stranding, but no left hydroureter. And the distal left ureter in the pelvis seems to remain normal. No convincing ureteral calculus. Left hemipelvis phleboliths are stable. Negative urinary bladder, no stone within the bladder. Stomach/Bowel: Redundant sigmoid colon at the pelvic inlet with retained gas and stool. Diverticulosis in the upstream descending colon, no definite active inflammation there. Negative transverse and right colon. Normal gas containing appendix series 2, image 60. Nondilated small bowel. Chronic gastric bypass. Downstream small bowel anastomosis in the left abdomen series 2, image 30 appears stable. No pneumoperitoneum, free fluid, convincing mesenteric inflammation. Vascular/Lymphatic: Normal caliber abdominal aorta. Mild Aortoiliac calcified atherosclerosis. No lymphadenopathy identified. Reproductive: Surgically absent uterus. Diminutive or absent ovaries. Other: No pelvis free fluid. Musculoskeletal: Lumbar disc and endplate degeneration with occasional vacuum disc. No acute osseous abnormality identified. IMPRESSION: 1. Persistent left nephrolithiasis, both renal pelves slightly larger since December, and mild left periureteral stranding. But no urinary calculus identified. Query recently passed stone. However, UTI or urinary infection not excluded. 2. No other acute or inflammatory process identified in the noncontrast abdomen or pelvis. 3. 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 Electronically Signed   By: Odessa Fleming M.D.   On:  10/02/2023 06:42   DG Chest Port 1 View Result Date: 10/02/2023 CLINICAL DATA:  58 year old female with possible sepsis. EXAM: PORTABLE CHEST 1 VIEW COMPARISON:  Chest CT 05/30/2023 and earlier. FINDINGS: Portable AP semi upright view at 0532 hours. Low normal lung volumes. Normal cardiac size and mediastinal contours. Visualized tracheal air column is within normal limits. Allowing for portable technique the lungs are clear. No pneumothorax or pleural effusion. Chronic cervical ACDF and thyroidectomy. Negative visible bowel gas. No acute osseous abnormality identified. IMPRESSION: No acute cardiopulmonary abnormality. Electronically Signed   By: Odessa Fleming M.D.   On: 10/02/2023 06:17  Labs: BNP (last 3 results) No results for input(s): "BNP" in the last 8760 hours. Basic Metabolic Panel: Recent Labs  Lab 10/02/23 0440 10/03/23 0349  NA 135 138  K 3.2* 3.8  CL 101 106  CO2 23 24  GLUCOSE 104* 86  BUN 16 12  CREATININE 0.87 0.62  CALCIUM 8.4* 8.3*  MG 1.5*  --   PHOS 3.6  --    Liver Function Tests: Recent Labs  Lab 10/02/23 0440 10/03/23 0349  AST 19 41  ALT 13 22  ALKPHOS 47 56  BILITOT 1.0 0.4  PROT 7.1 6.2*  ALBUMIN 3.4* 2.8*  CBC: Recent Labs  Lab 10/02/23 0440 10/03/23 0349  WBC 14.1* 6.7  NEUTROABS 11.9*  --   HGB 12.3 11.4*  HCT 37.7 36.3  MCV 89.8 93.3  PLT 237 196  Cardiac Enzymes: Recent Labs  Lab 10/02/23 0440  CKTOTAL 92      Component Value Date/Time   COLORURINE YELLOW 10/02/2023 0640   APPEARANCEUR HAZY (A) 10/02/2023 0640   LABSPEC 1.012 10/02/2023 0640   PHURINE 5.0 10/02/2023 0640   GLUCOSEU NEGATIVE 10/02/2023 0640   HGBUR SMALL (A) 10/02/2023 0640   BILIRUBINUR NEGATIVE 10/02/2023 0640   KETONESUR 5 (A) 10/02/2023 0640   PROTEINUR 30 (A) 10/02/2023 0640   UROBILINOGEN 0.2 06/18/2009 1413   NITRITE NEGATIVE 10/02/2023 0640   LEUKOCYTESUR LARGE (A) 10/02/2023 0640   Sepsis Labs Recent Labs  Lab 10/02/23 0440 10/03/23 0349  WBC 14.1*  6.7   Microbiology Recent Results (from the past 240 hours)  Blood Culture (routine x 2)     Status: Abnormal   Collection Time: 10/02/23  5:11 AM   Specimen: BLOOD  Result Value Ref Range Status   Specimen Description   Final    BLOOD RIGHT ANTECUBITAL Performed at [facility], [address], [place], [place] [address]    Special Requests   Final    BOTTLES DRAWN AEROBIC AND ANAEROBIC Blood Culture results may not be optimal due to an inadequate volume of blood received in culture bottles Performed at [facility], [address] [place], [place] [address]    Culture  Setup Time   Final    GRAM NEGATIVE RODS AEROBIC BOTTLE ONLY CRITICAL RESULT CALLED TO, READ BACK BY AND VERIFIED WITH: Karma Lew [id number] FCP Performed at [facility], [address] [place], [place] [address]    Culture (A)  Final    ESCHERICHIA COLI Confirmed Extended Spectrum Beta-Lactamase Producer (ESBL).  In bloodstream infections from ESBL organisms, carbapenems are preferred over piperacillin/tazobactam. They are shown to have a lower risk of mortality.    Report Status 10/05/2023 FINAL  Final   Organism ID, Bacteria ESCHERICHIA COLI  Final      Susceptibility   Escherichia coli - MIC*    AMPICILLIN >=32 RESISTANT Resistant     CEFEPIME 4 INTERMEDIATE Intermediate     CEFTRIAXONE >=64 RESISTANT Resistant     CIPROFLOXACIN 0.5 INTERMEDIATE Intermediate     GENTAMICIN <=1 SENSITIVE Sensitive     IMIPENEM <=0.25 SENSITIVE Sensitive     TRIMETH/SULFA <=20 SENSITIVE Sensitive     AMPICILLIN/SULBACTAM >=32 RESISTANT Resistant     PIP/TAZO <=4 SENSITIVE Sensitive ug/mL    * ESCHERICHIA COLI  Resp panel by RT-PCR (RSV, Flu A&B, Covid) Anterior Nasal Swab     Status: None   Collection Time: 10/02/23  5:11 AM   Specimen: Anterior Nasal Swab  Result Value Ref Range Status   SARS Coronavirus 2 by RT PCR NEGATIVE NEGATIVE Final    Comment:  (NOTE) SARS-CoV-2 target nucleic acids are NOT DETECTED.  The SARS-CoV-2 RNA is generally detectable in upper respiratory specimens during the acute phase of infection. The lowest concentration of SARS-CoV-2 viral copies this assay can detect is 138 copies/mL. A negative result does not preclude SARS-Cov-2 infection and should not be used as the sole basis for treatment or other patient management decisions. A  negative result may occur with  improper specimen collection/handling, submission of specimen other than nasopharyngeal swab, presence of viral mutation(s) within the areas targeted by this assay, and inadequate number of viral copies(<138 copies/mL). A negative result must be combined with clinical observations, patient history, and epidemiological information. The expected result is Negative.  Fact Sheet for Patients:  BloggerCourse.com  Fact Sheet for Healthcare Providers:  SeriousBroker.it  This test is no t yet approved or cleared by the [place] FDA and  has been authorized for detection and/or diagnosis of SARS-CoV-2 by FDA under an Emergency Use Authorization (EUA). This EUA will remain  in effect (meaning this test can be used) for the duration of the COVID-19 declaration under Section 564(b)(1) of the Act, 21 U.S.C.section 360bbb-3(b)(1), unless the authorization is terminated  or revoked sooner.       Influenza A by PCR NEGATIVE NEGATIVE Final   Influenza B by PCR NEGATIVE NEGATIVE Final    Comment: (NOTE) The Xpert Xpress SARS-CoV-2/FLU/RSV plus assay is intended as an aid in the diagnosis of influenza from Nasopharyngeal swab specimens and should not be used as a sole basis for treatment. Nasal washings and aspirates are unacceptable for Xpert Xpress SARS-CoV-2/FLU/RSV testing.  Fact Sheet for Patients: BloggerCourse.com  Fact Sheet for Healthcare  Providers: SeriousBroker.it  This test is not yet approved or cleared by the [place] FDA and has been authorized for detection and/or diagnosis of SARS-CoV-2 by FDA under an Emergency Use Authorization (EUA). This EUA will remain in effect (meaning this test can be used) for the duration of the COVID-19 declaration under Section 564(b)(1) of the Act, 21 U.S.C. section 360bbb-3(b)(1), unless the authorization is terminated or revoked.     Resp Syncytial Virus by PCR NEGATIVE NEGATIVE Final    Comment: (NOTE) Fact Sheet for Patients: BloggerCourse.com  Fact Sheet for Healthcare Providers: SeriousBroker.it  This test is not yet approved or cleared by the [place] FDA and has been authorized for detection and/or diagnosis of SARS-CoV-2 by FDA under an Emergency Use Authorization (EUA). This EUA will remain in effect (meaning this test can be used) for the duration of the COVID-19 declaration under Section 564(b)(1) of the Act, 21 U.S.C. section 360bbb-3(b)(1), unless the authorization is terminated or revoked.  Performed at [facility], [address], [place], [place] [address]   Blood Culture (routine x 2)     Status: Abnormal   Collection Time: 10/02/23  5:30 AM   Specimen: BLOOD LEFT ARM  Result Value Ref Range Status   Specimen Description   Final    BLOOD LEFT ARM Performed at [facility], [address], [place], [place] [address]    Special Requests   Final    BOTTLES DRAWN AEROBIC AND ANAEROBIC Blood Culture results may not be optimal due to an inadequate volume of blood received in culture bottles Performed at [facility], [address], [place], [place] [address]    Culture  Setup Time   Final    GRAM NEGATIVE RODS ANAEROBIC BOTTLE ONLY CRITICAL RESULT CALLED TO, READ BACK BY AND VERIFIED WITH: PHARMD E JACKSON 10/03/2023 @ 0149 BY  AB    Culture (A)  Final    ESCHERICHIA COLI SUSCEPTIBILITIES PERFORMED ON PREVIOUS CULTURE WITHIN THE LAST 5 DAYS. Performed at [facility], [address], [place], [place] [address]    Report Status 10/05/2023 FINAL  Final  Blood Culture ID Panel (Reflexed)  Status: Abnormal   Collection Time: 10/02/23  5:30 AM  Result Value Ref Range Status   Enterococcus faecalis NOT DETECTED NOT DETECTED Final   Enterococcus Faecium NOT DETECTED NOT DETECTED Final   Listeria monocytogenes NOT DETECTED NOT DETECTED Final   Staphylococcus species NOT DETECTED NOT DETECTED Final   Staphylococcus aureus (BCID) NOT DETECTED NOT DETECTED Final   Staphylococcus epidermidis NOT DETECTED NOT DETECTED Final   Staphylococcus lugdunensis NOT DETECTED NOT DETECTED Final   Streptococcus species NOT DETECTED NOT DETECTED Final   Streptococcus agalactiae NOT DETECTED NOT DETECTED Final   Streptococcus pneumoniae NOT DETECTED NOT DETECTED Final   Streptococcus pyogenes NOT DETECTED NOT DETECTED Final   A.calcoaceticus-baumannii NOT DETECTED NOT DETECTED Final   Bacteroides fragilis NOT DETECTED NOT DETECTED Final   Enterobacterales DETECTED (A) NOT DETECTED Final    Comment: Enterobacterales represent a large order of gram negative bacteria, not a single organism. CRITICAL RESULT CALLED TO, READ BACK BY AND VERIFIED WITH: PHARMD E JACKSON 10/03/2023 @ 0149 BY AB    Enterobacter cloacae complex NOT DETECTED NOT DETECTED Final   Escherichia coli DETECTED (A) NOT DETECTED Final    Comment: CRITICAL RESULT CALLED TO, READ BACK BY AND VERIFIED WITH: PHARMD E JACKSON 10/03/2023 @ 0149 BY AB    Klebsiella aerogenes NOT DETECTED NOT DETECTED Final   Klebsiella oxytoca NOT DETECTED NOT DETECTED Final   Klebsiella pneumoniae NOT DETECTED NOT DETECTED Final   Proteus species NOT DETECTED NOT DETECTED Final   Salmonella species NOT DETECTED NOT DETECTED Final   Serratia marcescens NOT DETECTED NOT DETECTED  Final   Haemophilus influenzae NOT DETECTED NOT DETECTED Final   Neisseria meningitidis NOT DETECTED NOT DETECTED Final   Pseudomonas aeruginosa NOT DETECTED NOT DETECTED Final   Stenotrophomonas maltophilia NOT DETECTED NOT DETECTED Final   Candida albicans NOT DETECTED NOT DETECTED Final   Candida auris NOT DETECTED NOT DETECTED Final   Candida glabrata NOT DETECTED NOT DETECTED Final   Candida krusei NOT DETECTED NOT DETECTED Final   Candida parapsilosis NOT DETECTED NOT DETECTED Final   Candida tropicalis NOT DETECTED NOT DETECTED Final   Cryptococcus neoformans/gattii NOT DETECTED NOT DETECTED Final   CTX-M ESBL DETECTED (A) NOT DETECTED Final    Comment: CRITICAL RESULT CALLED TO, READ BACK BY AND VERIFIED WITH: PHARMD E JACKSON 10/03/2023 @ 0149 BY AB (NOTE) Extended spectrum beta-lactamase detected. Recommend a carbapenem as initial therapy.      Carbapenem resistance IMP NOT DETECTED NOT DETECTED Final   Carbapenem resistance KPC NOT DETECTED NOT DETECTED Final   Carbapenem resistance NDM NOT DETECTED NOT DETECTED Final   Carbapenem resist OXA 48 LIKE NOT DETECTED NOT DETECTED Final   Carbapenem resistance VIM NOT DETECTED NOT DETECTED Final    Comment: Performed at [facility], [address], [place], [place] [address]  Urine Culture     Status: Abnormal   Collection Time: 10/02/23  6:40 AM   Specimen: Urine, Random  Result Value Ref Range Status   Specimen Description   Final    URINE, RANDOM Performed at [facility], [address] [place], [place] [address]    Special Requests   Final    NONE Reflexed from [id number] Performed at [facility], [address] [place], [place] [address]    Culture (A)  Final    <10,000 COLONIES/mL INSIGNIFICANT GROWTH Performed at [facility], [address], [place], [place] [address]    Report Status  10/03/2023 FINAL  Final  Culture, blood (Routine X 2) w Reflex to ID Panel      Status: None (Preliminary result)   Collection Time: 10/05/23 11:10 AM   Specimen: BLOOD RIGHT ARM  Result Value Ref Range Status   Specimen Description   Final    BLOOD RIGHT ARM Performed at [facility], [address], [place], [place] [address]    Special Requests   Final    BOTTLES DRAWN AEROBIC AND ANAEROBIC Blood Culture results may not be optimal due to an inadequate volume of blood received in culture bottles Performed at [facility], [address], [place], [place] [address]    Culture   Final    NO GROWTH < 24 HOURS Performed at [facility], [address] [place], [place] [address]    Report Status PENDING  Incomplete  Culture, blood (Routine X 2) w Reflex to ID Panel     Status: None (Preliminary result)   Collection Time: 10/05/23 11:14 AM   Specimen: BLOOD LEFT HAND  Result Value Ref Range Status   Specimen Description   Final    BLOOD LEFT HAND Performed at [facility], [address] [place], [place] [address]    Special Requests   Final    BOTTLES DRAWN AEROBIC AND ANAEROBIC Blood Culture results may not be optimal due to an inadequate volume of blood received in culture bottles Performed at [facility], [address], [place], [place] [address]    Culture   Final    NO GROWTH < 24 HOURS Performed at [facility], [address] [place], [place] [address]    Report Status PENDING  Incomplete  Time coordinating discharge: 35 minutes SIGNED: Lanae Boast, MD  [facility] 10/06/2023, 3:13 PM  If 7PM-7AM, please contact night-coverage [email or web address]

## 2023-10-07 DIAGNOSIS — N12 Tubulo-interstitial nephritis, not specified as acute or chronic: Secondary | ICD-10-CM | POA: Diagnosis not present

## 2023-10-07 DIAGNOSIS — A499 Bacterial infection, unspecified: Secondary | ICD-10-CM | POA: Diagnosis not present

## 2023-10-08 DIAGNOSIS — N12 Tubulo-interstitial nephritis, not specified as acute or chronic: Secondary | ICD-10-CM | POA: Diagnosis not present

## 2023-10-09 DIAGNOSIS — N12 Tubulo-interstitial nephritis, not specified as acute or chronic: Secondary | ICD-10-CM | POA: Diagnosis not present

## 2023-10-10 DIAGNOSIS — N12 Tubulo-interstitial nephritis, not specified as acute or chronic: Secondary | ICD-10-CM | POA: Diagnosis not present

## 2023-10-10 LAB — CULTURE, BLOOD (ROUTINE X 2)
Culture: NO GROWTH
Culture: NO GROWTH

## 2023-10-11 DIAGNOSIS — N12 Tubulo-interstitial nephritis, not specified as acute or chronic: Secondary | ICD-10-CM | POA: Diagnosis not present

## 2023-10-12 DIAGNOSIS — N12 Tubulo-interstitial nephritis, not specified as acute or chronic: Secondary | ICD-10-CM | POA: Diagnosis not present

## 2023-10-13 DIAGNOSIS — A499 Bacterial infection, unspecified: Secondary | ICD-10-CM | POA: Diagnosis not present

## 2023-10-13 DIAGNOSIS — N12 Tubulo-interstitial nephritis, not specified as acute or chronic: Secondary | ICD-10-CM | POA: Diagnosis not present

## 2023-10-18 ENCOUNTER — Inpatient Hospital Stay: Payer: Self-pay | Admitting: Infectious Diseases

## 2023-10-18 DIAGNOSIS — Z87442 Personal history of urinary calculi: Secondary | ICD-10-CM | POA: Diagnosis not present

## 2023-10-24 ENCOUNTER — Encounter: Payer: Self-pay | Admitting: Infectious Diseases

## 2023-10-24 ENCOUNTER — Ambulatory Visit (INDEPENDENT_AMBULATORY_CARE_PROVIDER_SITE_OTHER): Admitting: Infectious Diseases

## 2023-10-24 ENCOUNTER — Other Ambulatory Visit: Payer: Self-pay

## 2023-10-24 VITALS — BP 130/83 | HR 89 | Temp 98.1°F | Ht 67.0 in | Wt 142.0 lb

## 2023-10-24 DIAGNOSIS — Z1612 Extended spectrum beta lactamase (ESBL) resistance: Secondary | ICD-10-CM

## 2023-10-24 DIAGNOSIS — A498 Other bacterial infections of unspecified site: Secondary | ICD-10-CM | POA: Diagnosis not present

## 2023-10-24 DIAGNOSIS — Z452 Encounter for adjustment and management of vascular access device: Secondary | ICD-10-CM | POA: Insufficient documentation

## 2023-10-24 DIAGNOSIS — N12 Tubulo-interstitial nephritis, not specified as acute or chronic: Secondary | ICD-10-CM

## 2023-10-24 DIAGNOSIS — Z79899 Other long term (current) drug therapy: Secondary | ICD-10-CM | POA: Insufficient documentation

## 2023-10-24 NOTE — Progress Notes (Addendum)
 Patient Active Problem List   Diagnosis Date Noted   Pyelonephritis 10/02/2023   Hypokalemia 10/02/2023   Prolonged QT interval 10/02/2023   Mild protein malnutrition (HCC) 10/02/2023   Hyperglycemia 10/02/2023   Hypomagnesemia 10/02/2023   Malaise and fatigue 02/22/2022   B12 deficiency 09/09/2021   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   Gastric bypass status for obesity 09/08/2020   S/P gastric bypass 09/08/2020   Mediastinal adenopathy 08/27/2016   Anxiety 08/18/2016   GERD (gastroesophageal reflux disease) 08/18/2016   Hypocalcemia 08/18/2016   Postoperative hypothyroidism 08/18/2016   Lytic bone lesion of hip 08/08/2016   Pulmonary nodules 08/08/2016   Nodule of left lobe of thyroid  gland 08/04/2016   Hematuria 07/29/2016   Nephrolithiasis 07/19/2016   Other microscopic hematuria 06/22/2016   Hydronephrosis, bilateral 06/03/2016   S/P laparoscopic sleeve gastrectomy Dec 2016 06/09/2015   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     Cyanocobalamin  (VITAMIN B-12 PO) Take 1 tablet by mouth daily.     levothyroxine  (SYNTHROID ) 112 MCG tablet Take 112 mcg by mouth every evening.     LORazepam  (ATIVAN ) 0.5 MG tablet Take 0.5 mg by mouth every 8 (eight) hours as needed for anxiety.     lubiprostone (AMITIZA) 24 MCG capsule Take 24 mcg by mouth 2 (two) times daily.     pantoprazole  (PROTONIX ) 40 MG tablet Take 1 tablet (40 mg total) by mouth daily. 90 tablet 3   tirzepatide (MOUNJARO) 7.5 MG/0.5ML Pen Inject 7.5 mg into the skin once a week.     traZODone  (DESYREL ) 50 MG tablet Take 50 mg by mouth at bedtime.     venlafaxine  XR (EFFEXOR -XR) 150 MG 24 hr capsule Take 150 mg by mouth daily. with food     No current facility-administered medications on file prior to visit.   Subjective: 58 year old female with  prior history of sarcoidosis, pulmonary nodules, hypothyroidism, kidney stones, GERD, basal cell carcinoma, anxiety/depression, B12 deficiency, obesity s/p gastric sleeve resection, recurrent UTIs who is here for HFU after recent admission 4/6-4/10 for ESBL E coli bacteremia/Complicated UTI. Discharged on 4/10 to complete 10 days of IV meropenem /ertapenem , EOT 10/12/23  4/28 She has completed course of IV ertapenem  without missing doses or concerns. Midline has been removed. Prior symptoms have significantly improved. Seen by Urology 4/22 with UA done + for LE and some WBCs but many squamous epi cells. She is scheduled for lithotripsy in May. No concerns today.   Review of Systems: all systems reviewed with pertinent positives and negatives as listed above. Denies fevers, chills, nausea, vomiting or diarrhea.   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   bladder tack  GASTRIC ROUX-EN-Y N/A 09/08/2020   Procedure: LAPAROSCOPIC ROUX-EN-Y GASTRIC BYPASS WITH UPPER ENDOSCOPY, CONVERSION FROM LAPAROSCOPIC SLEEVE GASTECTOMY;  Surgeon: Jacolyn Matar, MD;  Location: [facility];  Service: General;  Laterality: N/A;  3.5 HOURS TOTAL PLEASE   HIATAL HERNIA REPAIR N/A 09/08/2020   Procedure: HERNIA REPAIR HIATAL;  Surgeon: Jacolyn Matar, MD;  Location: [facility];  Service: General;  Laterality: N/A;   LAPAROSCOPIC GASTRIC  SLEEVE RESECTION N/A 06/09/2015   Procedure: LAPAROSCOPIC GASTRIC SLEEVE RESECTION;  Surgeon: Jacolyn Matar, MD;  Location: [facility];  Service: General;  Laterality: N/A;   NECK SURGERY     Cervial fusion '12-[facility] - Dr. Adonis Alamin   SINUSOTOMY     THYROIDECTOMY  2019   TUBAL LIGATION     UPPER GI ENDOSCOPY N/A 06/09/2015   Procedure: UPPER GI ENDOSCOPY;  Surgeon: Jacolyn Matar, MD;  Location: [facility];  Service: General;  Laterality: N/A;   UPPER GI ENDOSCOPY N/A 09/08/2020   Procedure: UPPER GI ENDOSCOPY;  Surgeon: Jacolyn Matar, MD;  Location: [facility];  Service: General;  Laterality: N/A;   Social History   Tobacco Use   Smoking status: Never   Smokeless tobacco: Never  Vaping Use   Vaping status: Never Used  Substance Use Topics   Alcohol use: Yes    Comment: very rare   Drug use: No    Family History  Problem Relation Age of Onset   Heart disease Mother    Rheum arthritis Mother    Colon cancer Neg Hx    Esophageal cancer Neg Hx    Stomach cancer Neg Hx    Rectal cancer Neg Hx     Allergies  Allergen Reactions   Dilaudid [Hydromorphone Hcl] Palpitations   Sulfa Antibiotics Itching    Possible rash     Health Maintenance  Topic Date Due   Hepatitis C Screening  Never done   Zoster Vaccines- Shingrix (1 of 2) Never done   Cervical Cancer Screening (HPV/Pap Cotest)  Never done   COVID-19 Vaccine (3 - Pfizer risk series) 09/02/2019   MAMMOGRAM  05/10/2021   INFLUENZA VACCINE  01/27/2024   DTaP/Tdap/Td (2 - Td or Tdap) 05/03/2029   Colonoscopy  05/09/2033   HIV Screening  Completed   HPV VACCINES  Aged Out   Meningococcal B Vaccine  Aged Out    Objective: BP 130/83   Pulse 89   Temp 98.1 F (36.7 C) (Temporal)   Ht 5\' 7"  (1.702 m)   Wt 142 lb (64.4 kg)   SpO2 98%   BMI 22.24 kg/m    Physical Exam Constitutional:      Appearance: Normal appearance.  HENT:     Head: Normocephalic and atraumatic.      Mouth: Mucous membranes are moist.  Eyes:     Conjunctiva/sclera: Conjunctivae normal.     Pupils: Pupils are equal, round, and b/l symmetrical   Cardiovascular:     Rate and Rhythm: Normal rate and regular rhythm.     Heart sounds: s1s2.   Pulmonary:     Effort: Pulmonary effort is normal.     Breath sounds: Normal breath sounds.   Abdominal:     General: Non distended     Palpations: soft.   Musculoskeletal:        General: Normal range of motion.   Skin:    General: Skin is warm and dry.     Comments:  Neurological:     General: grossly non focal  Mental Status: awake, alert and oriented to person, place, and time.   Psychiatric:        Mood and Affect: Mood normal.   Lab Results Lab Results  Component Value Date   WBC 6.7 10/03/2023   HGB 11.4 (L) 10/03/2023   HCT 36.3 10/03/2023   MCV 93.3 10/03/2023   PLT 196 10/03/2023    Lab Results  Component Value Date   CREATININE 0.62 10/03/2023   BUN 12 10/03/2023   NA 138 10/03/2023   K 3.8 10/03/2023   CL 106 10/03/2023   CO2 24 10/03/2023    Lab Results  Component Value Date   ALT 22 10/03/2023   AST 41 10/03/2023   ALKPHOS 56 10/03/2023   BILITOT 0.4 10/03/2023    No results found for: "CHOL", "HDL", "LDLCALC", "LDLDIRECT", "TRIG", "CHOLHDL" No results found for: "LABRPR", "RPRTITER" No results found for: "HIV1RNAQUANT", "HIV1RNAVL", "CD4TABS"   Assessment/plan # ESBL E coli bacteremia # Complicated UTI  # Nephrolithiasis - sp completion of meropenem > ertapenem  on 10/12/23 - Fu with Urology as instructed for definitive stone management   # Medication Monitoring - 4/17 CBC and BMP unremarkable    # Midline  - removed   I have personally spent 25 minutes involved in face-to-face and non-face-to-face activities for this patient on the day of the visit. Professional time spent includes the following activities: Preparing to see the patient (review of tests), Obtaining and/or reviewing separately obtained history (admission/discharge record),  Performing a medically appropriate examination and/or evaluation , Ordering medications/tests/procedures, referring and communicating with other health care professionals, Documenting clinical information in the EMR, Independently interpreting results (not separately reported), Communicating results to the patient/family/caregiver, Counseling and educating the patient/family/caregiver and Care coordination (not separately reported).   Of note, portions of this note may have been created with voice recognition software. While this note has been edited for accuracy, occasional wrong-word or 'sound-a-like' substitutions may have occurred due to the inherent limitations of voice recognition software.   Melvina Stage, MD [facility] 10/24/2023, 3:44 PM

## 2023-11-07 DIAGNOSIS — N200 Calculus of kidney: Secondary | ICD-10-CM | POA: Diagnosis not present

## 2023-11-07 DIAGNOSIS — Z79899 Other long term (current) drug therapy: Secondary | ICD-10-CM | POA: Diagnosis not present

## 2023-11-16 DIAGNOSIS — N200 Calculus of kidney: Secondary | ICD-10-CM | POA: Diagnosis not present

## 2023-11-16 DIAGNOSIS — N390 Urinary tract infection, site not specified: Secondary | ICD-10-CM | POA: Diagnosis not present

## 2023-11-29 DIAGNOSIS — M7989 Other specified soft tissue disorders: Secondary | ICD-10-CM | POA: Diagnosis not present

## 2023-11-29 DIAGNOSIS — M25572 Pain in left ankle and joints of left foot: Secondary | ICD-10-CM | POA: Diagnosis not present

## 2023-11-29 DIAGNOSIS — W109XXA Fall (on) (from) unspecified stairs and steps, initial encounter: Secondary | ICD-10-CM | POA: Diagnosis not present

## 2023-11-29 DIAGNOSIS — S92152A Displaced avulsion fracture (chip fracture) of left talus, initial encounter for closed fracture: Secondary | ICD-10-CM | POA: Diagnosis not present

## 2023-11-29 DIAGNOSIS — S8262XA Displaced fracture of lateral malleolus of left fibula, initial encounter for closed fracture: Secondary | ICD-10-CM | POA: Diagnosis not present

## 2023-11-29 DIAGNOSIS — X501XXA Overexertion from prolonged static or awkward postures, initial encounter: Secondary | ICD-10-CM | POA: Diagnosis not present

## 2023-11-29 DIAGNOSIS — Y9301 Activity, walking, marching and hiking: Secondary | ICD-10-CM | POA: Diagnosis not present

## 2023-11-29 DIAGNOSIS — S99912A Unspecified injury of left ankle, initial encounter: Secondary | ICD-10-CM | POA: Diagnosis not present

## 2024-01-26 ENCOUNTER — Other Ambulatory Visit: Payer: Self-pay | Admitting: Internal Medicine

## 2024-01-26 DIAGNOSIS — Z1231 Encounter for screening mammogram for malignant neoplasm of breast: Secondary | ICD-10-CM

## 2024-02-03 ENCOUNTER — Other Ambulatory Visit: Payer: Self-pay | Admitting: Family Medicine

## 2024-02-03 ENCOUNTER — Encounter: Payer: Self-pay | Admitting: Family Medicine

## 2024-02-03 DIAGNOSIS — R921 Mammographic calcification found on diagnostic imaging of breast: Secondary | ICD-10-CM

## 2024-02-06 DIAGNOSIS — R490 Dysphonia: Secondary | ICD-10-CM | POA: Diagnosis not present

## 2024-02-06 DIAGNOSIS — R1310 Dysphagia, unspecified: Secondary | ICD-10-CM | POA: Diagnosis not present

## 2024-02-07 ENCOUNTER — Other Ambulatory Visit: Payer: Self-pay | Admitting: Physician Assistant

## 2024-02-07 DIAGNOSIS — R1311 Dysphagia, oral phase: Secondary | ICD-10-CM

## 2024-02-08 ENCOUNTER — Encounter

## 2024-02-08 DIAGNOSIS — Z1231 Encounter for screening mammogram for malignant neoplasm of breast: Secondary | ICD-10-CM

## 2024-02-14 ENCOUNTER — Ambulatory Visit
Admission: RE | Admit: 2024-02-14 | Discharge: 2024-02-14 | Disposition: A | Discharge status: Home / Self Care | Source: Ambulatory Visit | Attending: Family Medicine | Admitting: Family Medicine

## 2024-02-14 DIAGNOSIS — R921 Mammographic calcification found on diagnostic imaging of breast: Secondary | ICD-10-CM

## 2024-02-16 NOTE — Progress Notes (Deleted)
 Tammy Console, PA-C [address] [place], [place]  [address] Phone: [phone]   Primary Care Physician: Jackolyn Darice BROCKS, FNP  Primary Gastroenterologist:  Tammy Console, PA-C /Dr. Albertus  Chief Complaint: Dysphagia, GERD, hoarseness       HPI:   Tammy Wilson is a 58 y.o. female presents for recurrent dysphagia.  She last saw Dr. Albertus to evaluate dysphagia in 2024.  Patient saw ENT specialist 02/06/2024 to evaluate choking and voice hoarseness.  Intermittent nonproductive cough associated with dysphagia.  Choking occurs mainly with solid foods, not liquids, saliva, or pills.  She has not had any episodes of aspiration pneumonia.  Currently taking Protonix  40 Mg every morning.  Prior thyroidectomy and cervical spine surgery.  Non-smoker.  Laryngoscopy showed mild post glottic edema, otherwise normal.  Barium swallow with tablet test has been ordered and is scheduled for 03/08/2024.  Current symptoms:  07/2023 Enteroscopy at [facility]: Normal esophagus.  Roux-en-Y gastrojejunostomy with gastrojejunal anastomosis characterized by healthy-appearing mucosa.  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  Prolapse of the pylorus into the duodenal bulb.  Normal jejunum.  Biopsies showed reactive gastropathy.  Negative for H. pylori.  04/2023 EGD by Dr. Albertus:  2 cm hiatal hernia.  Lower third of esophagus mildly dilated and torturous.  Lower esophageal sphincter and GE junction widely patent.  Evidence of Roux-en-Y gastrojejunostomy.  Normal gastric pouch.  Healthy appearing mucosa at the anastomosis.  Healthy mucosa throughout.  No biopsies.  04/2023 Colonoscopy by Dr. Albertus: 2 small 5 mm hyperplastic polyps removed.  Diverticulosis.  Medium internal hemorrhoids.  10-year repeat (due 04/2033).  PMH: GERD, obesity, gastric sleeve in 2016, Roux-en-Y in 2022, hypothyroidism, sarcoidosis.  Current Outpatient Medications  Medication Sig Dispense Refill   buPROPion  (WELLBUTRIN  XL) 300 MG  24 hr tablet Take 300 mg by mouth daily.     Cyanocobalamin  (VITAMIN B-12 PO) Take 1 tablet by mouth daily.     levothyroxine  (SYNTHROID ) 112 MCG tablet Take 112 mcg by mouth every evening.     LORazepam  (ATIVAN ) 0.5 MG tablet Take 0.5 mg by mouth every 8 (eight) hours as needed for anxiety.     pantoprazole  (PROTONIX ) 40 MG tablet Take 1 tablet (40 mg total) by mouth daily. 90 tablet 3   tirzepatide (MOUNJARO) 7.5 MG/0.5ML Pen Inject 7.5 mg into the skin once a week.     traZODone  (DESYREL ) 50 MG tablet Take 50 mg by mouth at bedtime.     venlafaxine  XR (EFFEXOR -XR) 150 MG 24 hr capsule Take 150 mg by mouth daily. with food     No current facility-administered medications for this visit.    Allergies as of 02/17/2024 - Review Complete 10/24/2023  Allergen Reaction Noted   Dilaudid [hydromorphone hcl] Palpitations 04/07/2015   Sulfa antibiotics Itching 10/02/2023    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   bladder tack     GASTRIC ROUX-EN-Y N/A 09/08/2020   Procedure: LAPAROSCOPIC ROUX-EN-Y GASTRIC BYPASS WITH UPPER ENDOSCOPY, CONVERSION FROM LAPAROSCOPIC SLEEVE GASTECTOMY;  Surgeon: Gladis Cough, MD;  Location: [facility];  Service: General;  Laterality: N/A;  3.5 HOURS TOTAL PLEASE   HIATAL HERNIA REPAIR N/A 09/08/2020   Procedure: HERNIA REPAIR HIATAL;  Surgeon: Gladis Cough, MD;  Location: [facility];  Service: General;  Laterality: N/A;   LAPAROSCOPIC GASTRIC SLEEVE RESECTION N/A 06/09/2015   Procedure: LAPAROSCOPIC GASTRIC SLEEVE RESECTION;  Surgeon: Cough Gladis, MD;  Location: [facility];  Service: General;  Laterality: N/A;   NECK SURGERY     Cervial fusion '12-[facility] - Dr. Louis   SINUSOTOMY     THYROIDECTOMY  2019   TUBAL LIGATION     UPPER GI ENDOSCOPY N/A 06/09/2015   Procedure: UPPER GI ENDOSCOPY;  Surgeon: Cough Gladis, MD;  Location: [facility];  Service: General;  Laterality: N/A;   UPPER GI ENDOSCOPY N/A 09/08/2020   Procedure: UPPER GI ENDOSCOPY;  Surgeon: Gladis Cough, MD;  Location: [facility];  Service: General;  Laterality: N/A;    Review of Systems:    All systems reviewed and negative except where noted in HPI.    Physical Exam:  There were no vitals taken for this visit. No LMP recorded. Patient has had a hysterectomy.  General: Well-nourished, well-developed in no acute distress.  Lungs: Clear to auscultation bilaterally. Non-labored. Heart: Regular rate and rhythm, no murmurs rubs or gallops.  Abdomen: Bowel sounds are normal; Abdomen is Soft; No hepatosplenomegaly, masses or hernias;  No Abdominal Tenderness; No guarding or rebound tenderness. Neuro: Alert and oriented x 3.  Grossly intact.  Psych: Alert and cooperative, normal mood and affect.   Imaging Studies: MM 3D DIAGNOSTIC MAMMOGRAM BILATERAL BREAST Result Date: 02/14/2024 CLINICAL DATA:  Delayed follow-up after a benign biopsy of left breast calcifications. Follow-up was for adjacent residual calcifications. No current breast complaints. Patient has had a 50-100 pound weight loss since her prior mammograms. EXAM: DIGITAL DIAGNOSTIC BILATERAL MAMMOGRAM WITH TOMOSYNTHESIS AND CAD TECHNIQUE: Bilateral digital diagnostic mammography and breast tomosynthesis was performed. The images were evaluated with computer-aided detection. COMPARISON:  Previous exam(s). ACR Breast Density Category b: There are  scattered areas of fibroglandular density. FINDINGS: Residual calcifications adjacent to the coil shaped post biopsy marker clip in the lateral left breast are unchanged. There are no breast masses, areas of significant asymmetry, areas of architectural distortion or new or suspicious calcifications. IMPRESSION: 1. No evidence of breast malignancy. 2. Small group of benign residual calcifications adjacent to the coil shaped post biopsy marker clip in the left breast. RECOMMENDATION: Screening mammogram in one year.(Code:SM-B-01Y) I have discussed the findings and recommendations with the patient. If applicable, a reminder letter will be sent to the patient regarding the next appointment. BI-RADS CATEGORY  2: Benign. Electronically Signed   By: Alm Parkins M.D.   On: 02/14/2024 14:22    Labs: CBC    Component Value Date/Time   WBC 6.7 10/03/2023 0349   RBC 3.89 10/03/2023 0349   HGB 11.4 (L) 10/03/2023 0349   HCT 36.3 10/03/2023 0349   PLT 196 10/03/2023 0349   MCV 93.3 10/03/2023 0349   MCH 29.3 10/03/2023 0349   MCHC 31.4 10/03/2023 0349   RDW 13.2 10/03/2023 0349   LYMPHSABS 0.6 (L) 10/02/2023 0440   MONOABS 1.5 (H) 10/02/2023 0440   EOSABS 0.0 10/02/2023 0440   BASOSABS 0.0  10/02/2023 0440    CMP     Component Value Date/Time   NA 138 10/03/2023 0349   K 3.8 10/03/2023 0349   CL 106 10/03/2023 0349   CO2 24 10/03/2023 0349   GLUCOSE 86 10/03/2023 0349   BUN 12 10/03/2023 0349   CREATININE 0.62 10/03/2023 0349   CALCIUM 8.3 (L) 10/03/2023 0349   PROT 6.2 (L) 10/03/2023 0349   ALBUMIN  2.8 (L) 10/03/2023 0349   AST 41 10/03/2023 0349   ALT 22 10/03/2023 0349   ALKPHOS 56 10/03/2023 0349   BILITOT 0.4 10/03/2023 0349   GFRNONAA >60 10/03/2023 0349   GFRAA >60 06/09/2015 1336       Assessment and Plan:   DAISJA KESSINGER is a 58 y.o. y/o female presents for evaluation of:  1.  Recurrent dysphagia - Continue with plan for barium swallow with tablet  2.  History  of gastric sleeve and Roux-en-Y gastric bypass. - Follow-up with PCP to monitor for vitamin deficiencies and labs.  3.  GERD - Continue pantoprazole  40 Mg once daily. - Increase pantoprazole  to twice daily?  Changed to different PI?  Try vocalize no? - GERD diet.    Tammy Console, PA-C  Follow up ***

## 2024-02-17 ENCOUNTER — Encounter: Payer: Self-pay | Admitting: Physician Assistant

## 2024-02-17 ENCOUNTER — Other Ambulatory Visit (INDEPENDENT_AMBULATORY_CARE_PROVIDER_SITE_OTHER)

## 2024-02-17 ENCOUNTER — Ambulatory Visit: Admitting: Physician Assistant

## 2024-02-17 ENCOUNTER — Ambulatory Visit: Payer: Self-pay | Admitting: Physician Assistant

## 2024-02-17 VITALS — BP 118/68 | HR 72 | Ht 67.0 in | Wt 133.6 lb

## 2024-02-17 DIAGNOSIS — R1310 Dysphagia, unspecified: Secondary | ICD-10-CM | POA: Diagnosis not present

## 2024-02-17 DIAGNOSIS — K219 Gastro-esophageal reflux disease without esophagitis: Secondary | ICD-10-CM | POA: Diagnosis not present

## 2024-02-17 DIAGNOSIS — R634 Abnormal weight loss: Secondary | ICD-10-CM | POA: Diagnosis not present

## 2024-02-17 DIAGNOSIS — Z9884 Bariatric surgery status: Secondary | ICD-10-CM

## 2024-02-17 DIAGNOSIS — E890 Postprocedural hypothyroidism: Secondary | ICD-10-CM | POA: Diagnosis not present

## 2024-02-17 LAB — COMPREHENSIVE METABOLIC PANEL WITH GFR
ALT: 20 U/L (ref 0–35)
AST: 34 U/L (ref 0–37)
Albumin: 4 g/dL (ref 3.5–5.2)
Alkaline Phosphatase: 62 U/L (ref 39–117)
BUN: 19 mg/dL (ref 6–23)
CO2: 29 meq/L (ref 19–32)
Calcium: 8.6 mg/dL (ref 8.4–10.5)
Chloride: 106 meq/L (ref 96–112)
Creatinine, Ser: 0.78 mg/dL (ref 0.40–1.20)
GFR: 84.28 mL/min (ref >60.00)
Glucose, Bld: 89 mg/dL (ref 70–99)
Potassium: 3.9 meq/L (ref 3.5–5.1)
Sodium: 144 meq/L (ref 135–145)
Total Bilirubin: 0.3 mg/dL (ref 0.2–1.2)
Total Protein: 7.3 g/dL (ref 6.0–8.3)

## 2024-02-17 LAB — CBC WITH DIFFERENTIAL/PLATELET
Basophils Absolute: 0.1 K/uL (ref 0.0–0.1)
Basophils Relative: 1 % (ref 0.0–3.0)
Eosinophils Absolute: 0.2 K/uL (ref 0.0–0.7)
Eosinophils Relative: 3.7 % (ref 0.0–5.0)
HCT: 38.8 % (ref 36.0–46.0)
Hemoglobin: 12.5 g/dL (ref 12.0–15.0)
Lymphocytes Relative: 15.8 % (ref 12.0–46.0)
Lymphs Abs: 0.9 K/uL (ref 0.7–4.0)
MCHC: 32.3 g/dL (ref 30.0–36.0)
MCV: 90.7 fl (ref 78.0–100.0)
Monocytes Absolute: 0.6 K/uL (ref 0.1–1.0)
Monocytes Relative: 10.2 % (ref 3.0–12.0)
Neutro Abs: 4.1 K/uL (ref 1.4–7.7)
Neutrophils Relative %: 69.3 % (ref 43.0–77.0)
Platelets: 326 K/uL (ref 150.0–400.0)
RBC: 4.28 Mil/uL (ref 3.87–5.11)
RDW: 14 % (ref 11.5–15.5)
WBC: 5.9 K/uL (ref 4.0–10.5)

## 2024-02-17 LAB — TSH: TSH: 0.7 u[IU]/mL (ref 0.35–5.50)

## 2024-02-17 MED ORDER — PANTOPRAZOLE SODIUM 40 MG PO TBEC: 40.0000 mg | DELAYED_RELEASE_TABLET | Freq: Two times a day (BID) | ORAL | 2 refills | Status: DC

## 2024-02-17 NOTE — Progress Notes (Signed)
 Tammy Console, PA-C [address] [place], [place]  [address] Phone: [phone]   Primary Care Physician: Jackolyn Darice BROCKS, FNP  Primary Gastroenterologist:  Tammy Console, PA-C / Dr. Albertus  Chief Complaint: Dysphagia, GERD, hoarseness       HPI:   Tammy Wilson is a 58 y.o. female presents for recurrent dysphagia.  She last saw Dr. Albertus to evaluate dysphagia in 2024.  Patient saw ENT specialist 02/06/2024 to evaluate choking and voice hoarseness.  Intermittent nonproductive cough associated with dysphagia.  Choking occurs mainly with solid foods, not liquids, saliva, or pills.  She has not had any episodes of aspiration pneumonia.  Currently taking Protonix  40 Mg every morning.  Prior thyroidectomy and cervical spine surgery.  Non-smoker.  Laryngoscopy showed mild post glottic edema, otherwise normal.  Barium swallow with tablet test has been ordered and is scheduled for 03/08/2024.  Current symptoms: Patient states for several months she she has had excessive coughing, voice hoarseness, belching, and globus sensation.  Feels like it is difficult to swallow liquids in her throat.  Occasionally gets choked.  She is not having any solid food dysphagia, food bolus, or vomiting episodes.  Denies abdominal pain.  Was started on Mounjaro January 2025 to help with intentional weight loss.  She stopped Mounjaro a month ago because she had reached her goal.  She feels occasionally dizzy.  Last labs showed hypokalemia with potassium 3.2.  07/2023 Enteroscopy at [facility]: Normal esophagus.  Roux-en-Y gastrojejunostomy with gastrojejunal anastomosis characterized by healthy-appearing mucosa.  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  Prolapse of the pylorus into the duodenal bulb.  Normal jejunum.  Biopsies showed reactive gastropathy.  Negative for H. pylori.  04/2023 EGD by Dr. Albertus:  2 cm hiatal hernia.  Lower third of esophagus mildly dilated and torturous.  Lower esophageal sphincter and  GE junction widely patent.  Evidence of Roux-en-Y gastrojejunostomy.  Normal gastric pouch.  Healthy appearing mucosa at the anastomosis.  Healthy mucosa throughout.  No biopsies.  04/2023 Colonoscopy by Dr. Albertus: 2 small 5 mm hyperplastic polyps removed.  Diverticulosis.  Medium internal hemorrhoids.  10-year repeat (due 04/2033).  PMH: GERD, obesity, gastric sleeve in 2016, Roux-en-Y in 2022, hypothyroidism, sarcoidosis.  Current Outpatient Medications  Medication Sig Dispense Refill   buPROPion  (WELLBUTRIN  XL) 300 MG 24 hr tablet Take 300 mg by mouth daily.     Cyanocobalamin  (VITAMIN B-12 PO) Take 1 tablet by mouth daily.     levothyroxine  (SYNTHROID ) 112 MCG tablet Take 112 mcg by mouth every evening.     LORazepam  (ATIVAN ) 0.5 MG tablet Take 0.5 mg by mouth every 8 (eight) hours as needed for anxiety.     pantoprazole  (PROTONIX ) 40 MG tablet Take 1 tablet (40 mg total) by mouth daily. 90 tablet 3   tirzepatide (MOUNJARO) 7.5 MG/0.5ML Pen Inject 7.5 mg into the skin once a week.     traZODone  (DESYREL ) 50 MG tablet Take 50 mg by mouth at bedtime.     venlafaxine  XR (EFFEXOR -XR) 150 MG 24 hr capsule Take 150 mg by mouth daily. with food     No current facility-administered medications for this visit.    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   bladder tack     GASTRIC ROUX-EN-Y N/A 09/08/2020   Procedure: LAPAROSCOPIC ROUX-EN-Y GASTRIC BYPASS WITH UPPER ENDOSCOPY, CONVERSION FROM LAPAROSCOPIC SLEEVE GASTECTOMY;  Surgeon: Gladis Cough, MD;  Location: [facility];  Service: General;  Laterality: N/A;  3.5 HOURS TOTAL PLEASE   HIATAL HERNIA REPAIR N/A 09/08/2020   Procedure: HERNIA REPAIR HIATAL;  Surgeon: Gladis Cough, MD;  Location: [facility];  Service: General;  Laterality: N/A;   LAPAROSCOPIC GASTRIC SLEEVE RESECTION N/A 06/09/2015   Procedure: LAPAROSCOPIC GASTRIC SLEEVE RESECTION;  Surgeon: Cough Gladis, MD;  Location: [facility];  Service: General;  Laterality: N/A;   NECK SURGERY     Cervial fusion '12-[facility] - Dr. Louis   SINUSOTOMY     THYROIDECTOMY  2019   TUBAL LIGATION     UPPER GI ENDOSCOPY N/A 06/09/2015   Procedure: UPPER GI ENDOSCOPY;  Surgeon: Cough Gladis, MD;  Location: [facility];  Service: General;  Laterality: N/A;   UPPER GI ENDOSCOPY N/A 09/08/2020   Procedure: UPPER GI ENDOSCOPY;  Surgeon: Gladis Cough, MD;  Location: [facility];  Service: General;  Laterality: N/A;    Review of Systems:    All systems reviewed and negative except where noted in HPI.    Physical Exam:  BP 118/68   Pulse 72   Ht 5' 7 (1.702 m)   Wt 133 lb 9.6 oz (60.6 kg)   SpO2 96%   BMI 20.92 kg/m  No LMP recorded. Patient has had a hysterectomy.  General: Well-nourished, well-developed in no acute distress.  Lungs: Clear to auscultation bilaterally. Non-labored. Heart: Regular rate and rhythm, no murmurs rubs or gallops.  Abdomen: Bowel sounds are normal; Abdomen is Soft; No hepatosplenomegaly, masses or hernias;  No Abdominal Tenderness; No guarding or rebound tenderness. Neuro: Alert and oriented x 3.  Grossly intact.   Psych: Alert and cooperative, normal mood and affect.   Imaging Studies: MM 3D DIAGNOSTIC MAMMOGRAM BILATERAL BREAST Result Date: 02/14/2024 CLINICAL DATA:  Delayed follow-up after a benign biopsy of left breast calcifications. Follow-up was for adjacent residual calcifications. No current breast complaints. Patient has had a 50-100 pound weight loss since her prior mammograms. EXAM: DIGITAL DIAGNOSTIC BILATERAL MAMMOGRAM WITH TOMOSYNTHESIS AND CAD TECHNIQUE: Bilateral digital diagnostic mammography and breast tomosynthesis was performed. The images were evaluated with computer-aided detection. COMPARISON:  Previous exam(s). ACR Breast Density Category b: There are scattered areas of fibroglandular density. FINDINGS: Residual calcifications adjacent to the coil shaped post biopsy marker clip in the lateral left breast are unchanged. There are no breast masses, areas of significant asymmetry, areas of architectural distortion or new or suspicious calcifications. IMPRESSION: 1. No evidence of breast malignancy. 2. Small group of benign residual calcifications adjacent to the coil shaped post biopsy marker clip in the left breast. RECOMMENDATION: Screening mammogram in one year.(Code:SM-B-01Y) I have discussed the findings and recommendations with  the patient. If applicable, a reminder letter will be sent to the patient regarding the next appointment. BI-RADS CATEGORY  2: Benign. Electronically Signed   By: Alm Parkins M.D.   On: 02/14/2024 14:22    Labs: CBC    Component Value Date/Time   WBC 6.7 10/03/2023 0349   RBC 3.89 10/03/2023 0349   HGB 11.4 (L) 10/03/2023 0349   HCT 36.3 10/03/2023 0349   PLT 196 10/03/2023 0349   MCV 93.3 10/03/2023 0349   MCH 29.3 10/03/2023 0349   MCHC 31.4 10/03/2023 0349   RDW 13.2 10/03/2023 0349   LYMPHSABS 0.6 (L) 10/02/2023 0440   MONOABS 1.5 (H) 10/02/2023 0440   EOSABS 0.0 10/02/2023 0440   BASOSABS 0.0 10/02/2023 0440    CMP     Component Value  Date/Time   NA 138 10/03/2023 0349   K 3.8 10/03/2023 0349   CL 106 10/03/2023 0349   CO2 24 10/03/2023 0349   GLUCOSE 86 10/03/2023 0349   BUN 12 10/03/2023 0349   CREATININE 0.62 10/03/2023 0349   CALCIUM 8.3 (L) 10/03/2023 0349   PROT 6.2 (L) 10/03/2023 0349   ALBUMIN  2.8 (L) 10/03/2023 0349   AST 41 10/03/2023 0349   ALT 22 10/03/2023 0349   ALKPHOS 56 10/03/2023 0349   BILITOT 0.4 10/03/2023 0349   GFRNONAA >60 10/03/2023 0349   GFRAA >60 06/09/2015 1336     Assessment and Plan:   Tammy Wilson is a 58 y.o. y/o female presents for evaluation of:  1.  Recurrent dysphagia: Mild vague symptoms, mostly pharyngeal. - Continue with plan for barium swallow with tablet as scheduled.  2.  History of gastric sleeve and Roux-en-Y gastric bypass.  History of hypokalemia and hypothyroidism. - CBC, BMP, TSH  3.  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 - Increase pantoprazole  to 40 Mg twice daily, #60, 2 refills. - Recommend aggressive lifestyle Modifications to prevent Acid Reflux.  Rec. Avoid coffee, sodas, peppermint, garlic, onions, alcohol, citrus fruits, chocolate, tomatoes, fatty and spicey foods.  Avoid eating 2-3 hours before bedtime.     Tammy Console, PA-C  Follow up in 3 months.

## 2024-02-17 NOTE — Patient Instructions (Addendum)
 Your provider has requested that you go to the basement level for lab work before leaving today. Press B on the elevator. The lab is located at the first door on the left as you exit the elevator.  We have sent the following medications to your pharmacy for you to pick up at your convenience: Pantoprazole  40 mg twice daily   Please follow up sooner if symptoms increase or worsen  Due to recent changes in healthcare laws, you may see the results of your imaging and laboratory studies on MyChart before your provider has had a chance to review them.  We understand that in some cases there may be results that are confusing or concerning to you. Not all laboratory results come back in the same time frame and the provider may be waiting for multiple results in order to interpret others.  Please give us  48 hours in order for your provider to thoroughly review all the results before contacting the office for clarification of your results.   Thank you for trusting me with your gastrointestinal care!   Ellouise Console, PA-C _______________________________________________________  If your blood pressure at your visit was 140/90 or greater, please contact your primary care physician to follow up on this.  _______________________________________________________  If you are age 57 or older, your body mass index should be between 23-30. Your Body mass index is 20.92 kg/m. If this is out of the aforementioned range listed, please consider follow up with your Primary Care Provider.  If you are age 57 or younger, your body mass index should be between 19-25. Your Body mass index is 20.92 kg/m. If this is out of the aformentioned range listed, please consider follow up with your Primary Care Provider.   ________________________________________________________  The [facility] providers would like to encourage you to use MYCHART to communicate with providers for non-urgent requests or questions.  Due to long hold  times on the telephone, sending your provider a message by [facility] may be a faster and more efficient way to get a response.  Please allow 48 business hours for a response.  Please remember that this is for non-urgent requests.  _______________________________________________________

## 2024-03-05 DIAGNOSIS — E538 Deficiency of other specified B group vitamins: Secondary | ICD-10-CM | POA: Diagnosis not present

## 2024-03-05 DIAGNOSIS — E039 Hypothyroidism, unspecified: Secondary | ICD-10-CM | POA: Diagnosis not present

## 2024-03-05 DIAGNOSIS — K219 Gastro-esophageal reflux disease without esophagitis: Secondary | ICD-10-CM | POA: Diagnosis not present

## 2024-03-05 DIAGNOSIS — R5381 Other malaise: Secondary | ICD-10-CM | POA: Diagnosis not present

## 2024-03-05 DIAGNOSIS — F321 Major depressive disorder, single episode, moderate: Secondary | ICD-10-CM | POA: Diagnosis not present

## 2024-03-05 DIAGNOSIS — R5383 Other fatigue: Secondary | ICD-10-CM | POA: Diagnosis not present

## 2024-03-08 ENCOUNTER — Ambulatory Visit
Admission: RE | Admit: 2024-03-08 | Discharge: 2024-03-08 | Disposition: A | Discharge status: Home / Self Care | Source: Ambulatory Visit | Attending: Physician Assistant | Admitting: Physician Assistant

## 2024-03-08 DIAGNOSIS — M21372 Foot drop, left foot: Secondary | ICD-10-CM | POA: Diagnosis not present

## 2024-03-08 DIAGNOSIS — K449 Diaphragmatic hernia without obstruction or gangrene: Secondary | ICD-10-CM | POA: Diagnosis not present

## 2024-03-08 DIAGNOSIS — K219 Gastro-esophageal reflux disease without esophagitis: Secondary | ICD-10-CM | POA: Diagnosis not present

## 2024-03-08 DIAGNOSIS — R1311 Dysphagia, oral phase: Secondary | ICD-10-CM

## 2024-03-08 DIAGNOSIS — M4714 Other spondylosis with myelopathy, thoracic region: Secondary | ICD-10-CM | POA: Diagnosis not present

## 2024-03-08 DIAGNOSIS — M21371 Foot drop, right foot: Secondary | ICD-10-CM | POA: Diagnosis not present

## 2024-03-09 DIAGNOSIS — R6883 Chills (without fever): Secondary | ICD-10-CM | POA: Diagnosis not present

## 2024-03-09 DIAGNOSIS — R61 Generalized hyperhidrosis: Secondary | ICD-10-CM | POA: Diagnosis not present

## 2024-03-09 DIAGNOSIS — E161 Other hypoglycemia: Secondary | ICD-10-CM | POA: Diagnosis not present

## 2024-03-14 DIAGNOSIS — M4803 Spinal stenosis, cervicothoracic region: Secondary | ICD-10-CM | POA: Diagnosis not present

## 2024-03-14 DIAGNOSIS — M21371 Foot drop, right foot: Secondary | ICD-10-CM | POA: Diagnosis not present

## 2024-03-14 DIAGNOSIS — M5104 Intervertebral disc disorders with myelopathy, thoracic region: Secondary | ICD-10-CM | POA: Diagnosis not present

## 2024-03-14 DIAGNOSIS — M5136 Other intervertebral disc degeneration, lumbar region with discogenic back pain only: Secondary | ICD-10-CM | POA: Diagnosis not present

## 2024-03-14 DIAGNOSIS — M47814 Spondylosis without myelopathy or radiculopathy, thoracic region: Secondary | ICD-10-CM | POA: Diagnosis not present

## 2024-03-14 DIAGNOSIS — M4807 Spinal stenosis, lumbosacral region: Secondary | ICD-10-CM | POA: Diagnosis not present

## 2024-03-14 DIAGNOSIS — M4804 Spinal stenosis, thoracic region: Secondary | ICD-10-CM | POA: Diagnosis not present

## 2024-03-14 DIAGNOSIS — M4714 Other spondylosis with myelopathy, thoracic region: Secondary | ICD-10-CM | POA: Diagnosis not present

## 2024-03-14 DIAGNOSIS — R29898 Other symptoms and signs involving the musculoskeletal system: Secondary | ICD-10-CM | POA: Diagnosis not present

## 2024-03-14 DIAGNOSIS — M48061 Spinal stenosis, lumbar region without neurogenic claudication: Secondary | ICD-10-CM | POA: Diagnosis not present

## 2024-03-15 DIAGNOSIS — K219 Gastro-esophageal reflux disease without esophagitis: Secondary | ICD-10-CM | POA: Diagnosis not present

## 2024-03-15 DIAGNOSIS — R1314 Dysphagia, pharyngoesophageal phase: Secondary | ICD-10-CM | POA: Diagnosis not present

## 2024-03-21 ENCOUNTER — Other Ambulatory Visit: Payer: Self-pay | Admitting: Neurosurgery

## 2024-04-03 DIAGNOSIS — E890 Postprocedural hypothyroidism: Secondary | ICD-10-CM | POA: Diagnosis not present

## 2024-04-13 ENCOUNTER — Encounter (HOSPITAL_COMMUNITY): Payer: Self-pay | Admitting: *Deleted

## 2024-04-18 ENCOUNTER — Ambulatory Visit: Admitting: Physician Assistant

## 2024-04-19 NOTE — Progress Notes (Signed)
 Surgical Instructions   Your procedure is scheduled on Monday, October 27th. Report to [facility] Main Entrance A at 9:00 A.M., then check in with the Admitting office. Any questions or running late day of surgery: call [phone]  Questions prior to your surgery date: call [phone], Monday-Friday, 8am-4pm. If you experience any cold or flu symptoms such as cough, fever, chills, shortness of breath, etc. between now and your scheduled surgery, please notify us  at the above number.     Remember:  Do not eat or drink after midnight the night before your surgery. This includes no gum, mints, or hard candy.  Take these medicines the morning of surgery with A SIP OF WATER   buPROPion  (WELLBUTRIN  XL)  levothyroxine  (SYNTHROID )  pantoprazole  (PROTONIX )  venlafaxine  XR (EFFEXOR -XR)   May take these medicines IF NEEDED: LORazepam  (ATIVAN )    One week prior to surgery, STOP taking any Aspirin (unless otherwise instructed by your surgeon) Aleve, Naproxen, Ibuprofen, Motrin, Advil, Goody's, BC's, all herbal medications, fish oil, and non-prescription vitamins.  As of today, per your surgeon's instructions, HOLD your tirzepatide [facility]).                       Do NOT Smoke (Tobacco/Vaping) for 24 hours prior to your procedure.  If you use a CPAP at night, you may bring your mask/headgear for your overnight stay.   You will be asked to remove any contacts, glasses, piercing's, hearing aid's, dentures/partials prior to surgery. Please bring cases for these items if needed.    Patients discharged the day of surgery will not be allowed to drive home, and someone needs to stay with them for 24 hours.  SURGICAL WAITING ROOM VISITATION Patients may have no more than 2 support people in the waiting area - these visitors may rotate.   Pre-op nurse will coordinate an appropriate time for 1 ADULT support person, who may not rotate, to accompany patient in pre-op.  Children under the age of  45 must have an adult with them who is not the patient and must remain in the main waiting area with an adult.  If the patient needs to stay at the hospital during part of their recovery, the visitor guidelines for inpatient rooms apply.  Please refer to the [facility] website for the visitor guidelines for any additional information.   If you received a COVID test during your pre-op visit  it is requested that you wear a mask when out in public, stay away from anyone that may not be feeling well and notify your surgeon if you develop symptoms. If you have been in contact with anyone that has tested positive in the last 10 days please notify you surgeon.      Pre-operative 4 CHG Bathing Instructions   You can play a key role in reducing the risk of infection after surgery. Your skin needs to be as free of germs as possible. You can reduce the number of germs on your skin by washing with CHG (chlorhexidine  gluconate) soap before surgery. CHG is an antiseptic soap that kills germs and continues to kill germs even after washing.   DO NOT use if you have an allergy to chlorhexidine /CHG or antibacterial soaps. If your skin becomes reddened or irritated, stop using the CHG and notify one of our RNs at [phone].   Please shower with the CHG soap starting 4 days before surgery using the following schedule:     Please keep in mind the  following:  DO NOT shave, including legs and underarms, starting the day of your first shower.   Place clean sheets on your bed the day you start using CHG soap. Use a clean washcloth (not used since being washed) for each shower. DO NOT sleep with pets once you start using the CHG.   CHG Shower Instructions:  Wash your face and private area with normal soap. If you choose to wash your hair, wash first with your normal shampoo.  After you use shampoo/soap, rinse your hair and body thoroughly to remove shampoo/soap residue.  Turn the water  OFF and apply   bottle of CHG soap to a CLEAN washcloth.  Apply CHG soap ONLY FROM YOUR NECK DOWN TO YOUR TOES (washing for 3-5 minutes)  DO NOT use CHG soap on face, private areas, open wounds, or sores.  Pay special attention to the area where your surgery is being performed.  If you are having back surgery, having someone wash your back for you may be helpful. Wait 2 minutes after CHG soap is applied, then you may rinse off the CHG soap.  Pat dry with a clean towel  Put on clean clothes/pajamas   If you choose to wear lotion, please use ONLY the CHG-compatible lotions that are listed below.  Additional instructions for the day of surgery:  If you choose, you may shower the morning of surgery with an antibacterial soap.  DO NOT APPLY any lotions, deodorants, powders, or perfumes.   Do not bring valuables to the hospital. [facility] is not responsible for any belongings/valuables. Do not wear nail polish, gel polish, artificial nails, or any other type of covering on natural nails (fingers and toes) Do not wear jewelry or makeup Put on clean/comfortable clothes.  Please brush your teeth.  Ask your nurse before applying any prescription medications to the skin.     CHG Compatible Lotions   Aveeno Moisturizing lotion  Cetaphil Moisturizing Cream  Cetaphil Moisturizing Lotion  Clairol Herbal Essence Moisturizing Lotion, Dry Skin  Clairol Herbal Essence Moisturizing Lotion, Extra Dry Skin  Clairol Herbal Essence Moisturizing Lotion, Normal Skin  Curel Age Defying Therapeutic Moisturizing Lotion with Alpha Hydroxy  Curel Extreme Care Body Lotion  Curel Soothing Hands Moisturizing Hand Lotion  Curel Therapeutic Moisturizing Cream, Fragrance-Free  Curel Therapeutic Moisturizing Lotion, Fragrance-Free  Curel Therapeutic Moisturizing Lotion, Original Formula  Eucerin Daily Replenishing Lotion  Eucerin Dry Skin Therapy Plus Alpha Hydroxy Crme  Eucerin Dry Skin Therapy Plus Alpha Hydroxy Lotion   Eucerin Original Crme  Eucerin Original Lotion  Eucerin Plus Crme Eucerin Plus Lotion  Eucerin TriLipid Replenishing Lotion  Keri Anti-Bacterial Hand Lotion  Keri Deep Conditioning Original Lotion Dry Skin Formula Softly Scented  Keri Deep Conditioning Original Lotion, Fragrance Free Sensitive Skin Formula  Keri Lotion Fast Absorbing Fragrance Free Sensitive Skin Formula  Keri Lotion Fast Absorbing Softly Scented Dry Skin Formula  Keri Original Lotion  Keri Skin Renewal Lotion Keri Silky Smooth Lotion  Keri Silky Smooth Sensitive Skin Lotion  Nivea Body Creamy Conditioning Oil  Nivea Body Extra Enriched Lotion  Nivea Body Original Lotion  Nivea Body Sheer Moisturizing Lotion Nivea Crme  Nivea Skin Firming Lotion  NutraDerm 30 Skin Lotion  NutraDerm Skin Lotion  NutraDerm Therapeutic Skin Cream  NutraDerm Therapeutic Skin Lotion  ProShield Protective Hand Cream  Provon moisturizing lotion  Please read over the following fact sheets that you were given.

## 2024-04-20 ENCOUNTER — Encounter (HOSPITAL_COMMUNITY): Payer: Self-pay

## 2024-04-20 ENCOUNTER — Other Ambulatory Visit: Payer: Self-pay

## 2024-04-20 ENCOUNTER — Encounter (HOSPITAL_COMMUNITY)
Admission: RE | Admit: 2024-04-20 | Discharge: 2024-04-20 | Disposition: A | Discharge status: Home / Self Care | Source: Ambulatory Visit | Attending: Neurosurgery | Admitting: Neurosurgery

## 2024-04-20 VITALS — BP 127/84 | HR 71 | Temp 98.2°F | Resp 16 | Ht 67.0 in | Wt 139.3 lb

## 2024-04-20 DIAGNOSIS — J982 Interstitial emphysema: Secondary | ICD-10-CM | POA: Diagnosis not present

## 2024-04-20 DIAGNOSIS — E890 Postprocedural hypothyroidism: Secondary | ICD-10-CM | POA: Diagnosis present

## 2024-04-20 DIAGNOSIS — J9819 Other pulmonary collapse: Secondary | ICD-10-CM | POA: Diagnosis not present

## 2024-04-20 DIAGNOSIS — Z981 Arthrodesis status: Secondary | ICD-10-CM | POA: Diagnosis not present

## 2024-04-20 DIAGNOSIS — Z885 Allergy status to narcotic agent status: Secondary | ICD-10-CM | POA: Diagnosis not present

## 2024-04-20 DIAGNOSIS — Z01818 Encounter for other preprocedural examination: Secondary | ICD-10-CM

## 2024-04-20 DIAGNOSIS — J9811 Atelectasis: Secondary | ICD-10-CM | POA: Diagnosis not present

## 2024-04-20 DIAGNOSIS — K219 Gastro-esophageal reflux disease without esophagitis: Secondary | ICD-10-CM | POA: Diagnosis present

## 2024-04-20 DIAGNOSIS — J95811 Postprocedural pneumothorax: Secondary | ICD-10-CM | POA: Diagnosis not present

## 2024-04-20 DIAGNOSIS — Z8261 Family history of arthritis: Secondary | ICD-10-CM | POA: Diagnosis not present

## 2024-04-20 DIAGNOSIS — R918 Other nonspecific abnormal finding of lung field: Secondary | ICD-10-CM | POA: Diagnosis not present

## 2024-04-20 DIAGNOSIS — Z01812 Encounter for preprocedural laboratory examination: Secondary | ICD-10-CM | POA: Insufficient documentation

## 2024-04-20 DIAGNOSIS — Z0189 Encounter for other specified special examinations: Secondary | ICD-10-CM | POA: Diagnosis not present

## 2024-04-20 DIAGNOSIS — F32A Depression, unspecified: Secondary | ICD-10-CM | POA: Diagnosis present

## 2024-04-20 DIAGNOSIS — Z9889 Other specified postprocedural states: Secondary | ICD-10-CM | POA: Diagnosis not present

## 2024-04-20 DIAGNOSIS — Z882 Allergy status to sulfonamides status: Secondary | ICD-10-CM | POA: Diagnosis not present

## 2024-04-20 DIAGNOSIS — D869 Sarcoidosis, unspecified: Secondary | ICD-10-CM | POA: Diagnosis present

## 2024-04-20 DIAGNOSIS — T797XXA Traumatic subcutaneous emphysema, initial encounter: Secondary | ICD-10-CM | POA: Diagnosis not present

## 2024-04-20 DIAGNOSIS — Z7985 Long-term (current) use of injectable non-insulin antidiabetic drugs: Secondary | ICD-10-CM | POA: Diagnosis not present

## 2024-04-20 DIAGNOSIS — Z79899 Other long term (current) drug therapy: Secondary | ICD-10-CM | POA: Diagnosis not present

## 2024-04-20 DIAGNOSIS — Z85828 Personal history of other malignant neoplasm of skin: Secondary | ICD-10-CM | POA: Diagnosis not present

## 2024-04-20 DIAGNOSIS — Z79890 Hormone replacement therapy (postmenopausal): Secondary | ICD-10-CM | POA: Diagnosis not present

## 2024-04-20 DIAGNOSIS — Z8249 Family history of ischemic heart disease and other diseases of the circulatory system: Secondary | ICD-10-CM | POA: Diagnosis not present

## 2024-04-20 DIAGNOSIS — J939 Pneumothorax, unspecified: Secondary | ICD-10-CM | POA: Diagnosis not present

## 2024-04-20 DIAGNOSIS — M4714 Other spondylosis with myelopathy, thoracic region: Secondary | ICD-10-CM | POA: Diagnosis present

## 2024-04-20 DIAGNOSIS — F419 Anxiety disorder, unspecified: Secondary | ICD-10-CM | POA: Diagnosis present

## 2024-04-20 DIAGNOSIS — M5104 Intervertebral disc disorders with myelopathy, thoracic region: Secondary | ICD-10-CM | POA: Diagnosis present

## 2024-04-20 DIAGNOSIS — Z9884 Bariatric surgery status: Secondary | ICD-10-CM | POA: Diagnosis not present

## 2024-04-20 LAB — SURGICAL PCR SCREEN
MRSA, PCR: NEGATIVE
Staphylococcus aureus: POSITIVE — AB

## 2024-04-20 LAB — CBC
HCT: 37.9 % (ref 36.0–46.0)
Hemoglobin: 12.2 g/dL (ref 12.0–15.0)
MCH: 29 pg (ref 26.0–34.0)
MCHC: 32.2 g/dL (ref 30.0–36.0)
MCV: 90.2 fL (ref 80.0–100.0)
Platelets: 292 K/uL (ref 150–400)
RBC: 4.2 MIL/uL (ref 3.87–5.11)
RDW: 13.3 % (ref 11.5–15.5)
WBC: 4.7 K/uL (ref 4.0–10.5)
nRBC: 0 % (ref 0.0–0.2)

## 2024-04-20 LAB — BASIC METABOLIC PANEL WITH GFR
Anion gap: 11 (ref 5–15)
BUN: 15 mg/dL (ref 6–20)
CO2: 25 mmol/L (ref 22–32)
Calcium: 8.7 mg/dL — ABNORMAL LOW (ref 8.9–10.3)
Chloride: 103 mmol/L (ref 98–111)
Creatinine, Ser: 0.7 mg/dL (ref 0.44–1.00)
GFR, Estimated: >60 mL/min (ref >60)
Glucose, Bld: 81 mg/dL (ref 70–99)
Potassium: 3.9 mmol/L (ref 3.5–5.1)
Sodium: 139 mmol/L (ref 135–145)

## 2024-04-20 NOTE — Progress Notes (Signed)
 PCP - Darice Sprague Cardiologist - Denies  PPM/ICD - Denies Device Orders - n/a Rep Notified - n/a  Chest x-ray - n/a EKG - 10-05-23 Stress Test - denies ECHO - 10-03-23 Cardiac Cath - denies  Sleep Study - denies CPAP - n/a  Non-diabetic  Last dose of GLP1 agonist-  Mounjaro for weight loss GLP1 instructions: patient has not taken in over month, instructed not to take prior to surgery.  Blood Thinner Instructions: denies Aspirin Instructions:denies  ERAS Protcol - npo PRE-SURGERY Ensure or G2- none  COVID TEST- n/a   Anesthesia review: no  Patient denies shortness of breath, fever, cough and chest pain at PAT appointment   All instructions explained to the patient, with a verbal understanding of the material. Patient agrees to go over the instructions while at home for a better understanding. Patient also instructed to self quarantine after being tested for COVID-19. The opportunity to ask questions was provided.

## 2024-04-20 NOTE — Progress Notes (Signed)
 Will need to recollect type and screen on day of surgery.

## 2024-04-23 ENCOUNTER — Inpatient Hospital Stay (HOSPITAL_COMMUNITY)

## 2024-04-23 ENCOUNTER — Other Ambulatory Visit: Payer: Self-pay

## 2024-04-23 ENCOUNTER — Encounter (HOSPITAL_COMMUNITY)
Admission: RE | Disposition: A | Discharge status: Home Health | Payer: Self-pay | Source: Home / Self Care | Attending: Neurosurgery

## 2024-04-23 ENCOUNTER — Encounter (HOSPITAL_COMMUNITY): Payer: Self-pay | Admitting: Neurosurgery

## 2024-04-23 ENCOUNTER — Inpatient Hospital Stay (HOSPITAL_COMMUNITY): Admitting: Anesthesiology

## 2024-04-23 ENCOUNTER — Inpatient Hospital Stay (HOSPITAL_COMMUNITY)
Admission: RE | Admit: 2024-04-23 | Discharge: 2024-04-26 | DRG: 448 | Disposition: A | Discharge status: Home Health | Attending: Neurosurgery | Admitting: Neurosurgery

## 2024-04-23 DIAGNOSIS — Z882 Allergy status to sulfonamides status: Secondary | ICD-10-CM | POA: Diagnosis not present

## 2024-04-23 DIAGNOSIS — Z981 Arthrodesis status: Secondary | ICD-10-CM | POA: Diagnosis not present

## 2024-04-23 DIAGNOSIS — D869 Sarcoidosis, unspecified: Secondary | ICD-10-CM | POA: Diagnosis present

## 2024-04-23 DIAGNOSIS — E890 Postprocedural hypothyroidism: Secondary | ICD-10-CM | POA: Diagnosis present

## 2024-04-23 DIAGNOSIS — F419 Anxiety disorder, unspecified: Secondary | ICD-10-CM | POA: Diagnosis present

## 2024-04-23 DIAGNOSIS — Z85828 Personal history of other malignant neoplasm of skin: Secondary | ICD-10-CM

## 2024-04-23 DIAGNOSIS — K219 Gastro-esophageal reflux disease without esophagitis: Secondary | ICD-10-CM | POA: Diagnosis present

## 2024-04-23 DIAGNOSIS — Z79899 Other long term (current) drug therapy: Secondary | ICD-10-CM | POA: Diagnosis not present

## 2024-04-23 DIAGNOSIS — F32A Depression, unspecified: Secondary | ICD-10-CM | POA: Diagnosis present

## 2024-04-23 DIAGNOSIS — Z0189 Encounter for other specified special examinations: Secondary | ICD-10-CM | POA: Diagnosis not present

## 2024-04-23 DIAGNOSIS — Z8261 Family history of arthritis: Secondary | ICD-10-CM | POA: Diagnosis not present

## 2024-04-23 DIAGNOSIS — Z9884 Bariatric surgery status: Secondary | ICD-10-CM | POA: Diagnosis not present

## 2024-04-23 DIAGNOSIS — J9811 Atelectasis: Secondary | ICD-10-CM | POA: Diagnosis not present

## 2024-04-23 DIAGNOSIS — Z8249 Family history of ischemic heart disease and other diseases of the circulatory system: Secondary | ICD-10-CM

## 2024-04-23 DIAGNOSIS — M5104 Intervertebral disc disorders with myelopathy, thoracic region: Secondary | ICD-10-CM | POA: Diagnosis present

## 2024-04-23 DIAGNOSIS — Z7985 Long-term (current) use of injectable non-insulin antidiabetic drugs: Secondary | ICD-10-CM

## 2024-04-23 DIAGNOSIS — M4714 Other spondylosis with myelopathy, thoracic region: Secondary | ICD-10-CM | POA: Diagnosis present

## 2024-04-23 DIAGNOSIS — Z79890 Hormone replacement therapy (postmenopausal): Secondary | ICD-10-CM | POA: Diagnosis not present

## 2024-04-23 DIAGNOSIS — Z885 Allergy status to narcotic agent status: Secondary | ICD-10-CM

## 2024-04-23 LAB — TYPE AND SCREEN
ABO/RH(D): A POS
Antibody Screen: NEGATIVE

## 2024-04-23 SURGERY — THORACIC FUSION 2 LEVEL
Anesthesia: General | Site: Spine Thoracic | Laterality: Right

## 2024-04-23 MED ORDER — GABAPENTIN 300 MG PO CAPS
300.0000 mg | ORAL_CAPSULE | Freq: Three times a day (TID) | ORAL | Status: DC
  Administered 2024-04-23 – 2024-04-26 (×6): 300 mg via ORAL
  Filled 2024-04-23 (×8): qty 1

## 2024-04-23 MED ORDER — LEVOTHYROXINE SODIUM 100 MCG PO TABS
100.0000 ug | ORAL_TABLET | Freq: Every day | ORAL | Status: DC
  Administered 2024-04-24 – 2024-04-26 (×3): 100 ug via ORAL
  Filled 2024-04-23 (×3): qty 1

## 2024-04-23 MED ORDER — PHENYLEPHRINE HCL-NACL 20-0.9 MG/250ML-% IV SOLN
INTRAVENOUS | Status: DC | PRN
  Administered 2024-04-23: 20 ug/min via INTRAVENOUS

## 2024-04-23 MED ORDER — THROMBIN 20000 UNITS EX SOLR
CUTANEOUS | Status: AC
  Filled 2024-04-23: qty 20000

## 2024-04-23 MED ORDER — VENLAFAXINE HCL ER 150 MG PO CP24
150.0000 mg | ORAL_CAPSULE | Freq: Every day | ORAL | Status: DC
  Filled 2024-04-23: qty 1

## 2024-04-23 MED ORDER — CHLORHEXIDINE GLUCONATE 0.12 % MT SOLN
15.0000 mL | Freq: Once | OROMUCOSAL | Status: AC
  Administered 2024-04-23: 15 mL via OROMUCOSAL

## 2024-04-23 MED ORDER — BUPROPION HCL ER (XL) 300 MG PO TB24
300.0000 mg | ORAL_TABLET | Freq: Every day | ORAL | Status: DC
  Administered 2024-04-24 – 2024-04-26 (×3): 300 mg via ORAL
  Filled 2024-04-23 (×3): qty 1

## 2024-04-23 MED ORDER — ONDANSETRON HCL 4 MG/2ML IJ SOLN
INTRAMUSCULAR | Status: AC
  Filled 2024-04-23: qty 2

## 2024-04-23 MED ORDER — PROPOFOL 10 MG/ML IV BOLUS
INTRAVENOUS | Status: AC
Start: 2024-04-23 — End: 2024-04-23
  Filled 2024-04-23: qty 20

## 2024-04-23 MED ORDER — LIDOCAINE 2% (20 MG/ML) 5 ML SYRINGE
INTRAMUSCULAR | Status: DC | PRN
  Administered 2024-04-23: 100 mg via INTRAVENOUS

## 2024-04-23 MED ORDER — HYDROCODONE-ACETAMINOPHEN 10-325 MG PO TABS
1.0000 | ORAL_TABLET | ORAL | Status: DC | PRN
  Administered 2024-04-23 – 2024-04-26 (×9): 1 via ORAL
  Filled 2024-04-23 (×9): qty 1

## 2024-04-23 MED ORDER — CHLORHEXIDINE GLUCONATE 4 % EX SOLN: 1.0000 | CUTANEOUS | 1 refills | Status: DC

## 2024-04-23 MED ORDER — ACETAMINOPHEN 10 MG/ML IV SOLN
1000.0000 mg | Freq: Once | INTRAVENOUS | Status: AC
  Administered 2024-04-23: 1000 mg via INTRAVENOUS

## 2024-04-23 MED ORDER — MUPIROCIN 2 % EX OINT: 1.0000 | TOPICAL_OINTMENT | Freq: Two times a day (BID) | CUTANEOUS | 0 refills | Status: AC

## 2024-04-23 MED ORDER — VENLAFAXINE HCL ER 75 MG PO CP24: 75.0000 mg | ORAL_CAPSULE | Freq: Every day | ORAL | Status: DC

## 2024-04-23 MED ORDER — CHLORHEXIDINE GLUCONATE CLOTH 2 % EX PADS: 6.0000 | MEDICATED_PAD | Freq: Once | CUTANEOUS | Status: DC

## 2024-04-23 MED ORDER — ROCURONIUM BROMIDE 100 MG/10ML IV SOLN
INTRAVENOUS | Status: DC | PRN
  Administered 2024-04-23: 70 mg via INTRAVENOUS

## 2024-04-23 MED ORDER — FLEET ENEMA RE ENEM: 1.0000 | ENEMA | Freq: Once | RECTAL | Status: DC | PRN

## 2024-04-23 MED ORDER — CEFAZOLIN SODIUM-DEXTROSE 1-4 GM/50ML-% IV SOLN
1.0000 g | Freq: Three times a day (TID) | INTRAVENOUS | Status: AC
  Administered 2024-04-23 – 2024-04-24 (×2): 1 g via INTRAVENOUS
  Filled 2024-04-23 (×2): qty 50

## 2024-04-23 MED ORDER — THROMBIN 5000 UNITS EX KIT
PACK | CUTANEOUS | Status: AC
  Filled 2024-04-23: qty 1

## 2024-04-23 MED ORDER — CEFAZOLIN SODIUM-DEXTROSE 2-4 GM/100ML-% IV SOLN
2.0000 g | INTRAVENOUS | Status: AC
  Administered 2024-04-23: 2 g via INTRAVENOUS
  Filled 2024-04-23: qty 100

## 2024-04-23 MED ORDER — VENLAFAXINE HCL ER 75 MG PO CP24
225.0000 mg | ORAL_CAPSULE | Freq: Every day | ORAL | Status: DC
  Administered 2024-04-24 – 2024-04-26 (×3): 225 mg via ORAL
  Filled 2024-04-23 (×4): qty 3

## 2024-04-23 MED ORDER — ACETAMINOPHEN 325 MG PO TABS: 650.0000 mg | ORAL_TABLET | ORAL | Status: DC | PRN

## 2024-04-23 MED ORDER — 0.9 % SODIUM CHLORIDE (POUR BTL) OPTIME
TOPICAL | Status: DC | PRN
  Administered 2024-04-23: 1000 mL

## 2024-04-23 MED ORDER — MORPHINE SULFATE (PF) 2 MG/ML IV SOLN
2.0000 mg | INTRAVENOUS | Status: DC | PRN
  Administered 2024-04-23 – 2024-04-24 (×3): 2 mg via INTRAVENOUS
  Filled 2024-04-23 (×3): qty 1

## 2024-04-23 MED ORDER — ACETAMINOPHEN 650 MG RE SUPP: 650.0000 mg | RECTAL | Status: DC | PRN

## 2024-04-23 MED ORDER — DEXAMETHASONE SOD PHOSPHATE PF 10 MG/ML IJ SOLN
INTRAMUSCULAR | Status: DC | PRN
  Administered 2024-04-23: 10 mg via INTRAVENOUS

## 2024-04-23 MED ORDER — THROMBIN 20000 UNITS EX SOLR
CUTANEOUS | Status: DC | PRN
  Administered 2024-04-23: 20 mL via TOPICAL

## 2024-04-23 MED ORDER — LIDOCAINE 2% (20 MG/ML) 5 ML SYRINGE
INTRAMUSCULAR | Status: AC
  Filled 2024-04-23: qty 5

## 2024-04-23 MED ORDER — VITAMIN B-12 1000 MCG PO TABS
500.0000 ug | ORAL_TABLET | Freq: Every day | ORAL | Status: DC
Start: 2024-04-24 — End: 2024-04-26
  Administered 2024-04-24 – 2024-04-26 (×3): 500 ug via ORAL
  Filled 2024-04-23 (×4): qty 1

## 2024-04-23 MED ORDER — MENTHOL 3 MG MT LOZG: 1.0000 | LOZENGE | OROMUCOSAL | Status: DC | PRN

## 2024-04-23 MED ORDER — FENTANYL CITRATE (PF) 100 MCG/2ML IJ SOLN
INTRAMUSCULAR | Status: DC | PRN
  Administered 2024-04-23: 50 ug via INTRAVENOUS
  Administered 2024-04-23: 100 ug via INTRAVENOUS
  Administered 2024-04-23 (×2): 50 ug via INTRAVENOUS

## 2024-04-23 MED ORDER — ROCURONIUM BROMIDE 10 MG/ML (PF) SYRINGE
PREFILLED_SYRINGE | INTRAVENOUS | Status: AC
  Filled 2024-04-23: qty 10

## 2024-04-23 MED ORDER — TRAZODONE HCL 50 MG PO TABS
50.0000 mg | ORAL_TABLET | Freq: Every evening | ORAL | Status: DC | PRN
  Administered 2024-04-25: 50 mg via ORAL
  Filled 2024-04-23: qty 1

## 2024-04-23 MED ORDER — PROPOFOL 10 MG/ML IV BOLUS
INTRAVENOUS | Status: DC | PRN
Start: 2024-04-23 — End: 2024-04-23
  Administered 2024-04-23: 100 ug/kg/min via INTRAVENOUS
  Administered 2024-04-23: 120 mg via INTRAVENOUS

## 2024-04-23 MED ORDER — SODIUM CHLORIDE 0.9 % IV SOLN
250.0000 mL | INTRAVENOUS | Status: AC
  Administered 2024-04-23: 250 mL via INTRAVENOUS

## 2024-04-23 MED ORDER — METHOCARBAMOL 500 MG PO TABS
500.0000 mg | ORAL_TABLET | Freq: Four times a day (QID) | ORAL | Status: DC | PRN
Start: 2024-04-23 — End: 2024-04-26
  Administered 2024-04-24 – 2024-04-26 (×2): 500 mg via ORAL
  Filled 2024-04-23 (×3): qty 1

## 2024-04-23 MED ORDER — SODIUM CHLORIDE 0.9% FLUSH
3.0000 mL | Freq: Two times a day (BID) | INTRAVENOUS | Status: DC
  Administered 2024-04-23 – 2024-04-26 (×5): 3 mL via INTRAVENOUS

## 2024-04-23 MED ORDER — MIDAZOLAM HCL 2 MG/2ML IJ SOLN
INTRAMUSCULAR | Status: AC
  Filled 2024-04-23: qty 2

## 2024-04-23 MED ORDER — METHOCARBAMOL 1000 MG/10ML IJ SOLN: 500.0000 mg | Freq: Four times a day (QID) | INTRAMUSCULAR | Status: DC | PRN

## 2024-04-23 MED ORDER — FENTANYL CITRATE (PF) 250 MCG/5ML IJ SOLN
INTRAMUSCULAR | Status: AC
  Filled 2024-04-23: qty 5

## 2024-04-23 MED ORDER — PANTOPRAZOLE SODIUM 40 MG PO TBEC
40.0000 mg | DELAYED_RELEASE_TABLET | Freq: Two times a day (BID) | ORAL | Status: DC
  Administered 2024-04-24 – 2024-04-26 (×5): 40 mg via ORAL
  Filled 2024-04-23 (×5): qty 1

## 2024-04-23 MED ORDER — SODIUM CHLORIDE 0.9% FLUSH: 3.0000 mL | INTRAVENOUS | Status: DC | PRN

## 2024-04-23 MED ORDER — FENTANYL CITRATE (PF) 100 MCG/2ML IJ SOLN: 25.0000 ug | INTRAMUSCULAR | Status: DC | PRN

## 2024-04-23 MED ORDER — BISACODYL 10 MG RE SUPP
10.0000 mg | Freq: Every day | RECTAL | Status: DC | PRN
  Filled 2024-04-23: qty 1

## 2024-04-23 MED ORDER — BUPIVACAINE HCL (PF) 0.25 % IJ SOLN
INTRAMUSCULAR | Status: DC | PRN
  Administered 2024-04-23: 30 mL

## 2024-04-23 MED ORDER — PHENOL 1.4 % MT LIQD: 1.0000 | OROMUCOSAL | Status: DC | PRN

## 2024-04-23 MED ORDER — OXYCODONE HCL 5 MG PO TABS
10.0000 mg | ORAL_TABLET | ORAL | Status: DC | PRN
  Administered 2024-04-24 – 2024-04-26 (×7): 10 mg via ORAL
  Filled 2024-04-23 (×9): qty 2

## 2024-04-23 MED ORDER — BUPIVACAINE HCL (PF) 0.25 % IJ SOLN
INTRAMUSCULAR | Status: AC
  Filled 2024-04-23: qty 30

## 2024-04-23 MED ORDER — PHENYLEPHRINE 80 MCG/ML (10ML) SYRINGE FOR IV PUSH (FOR BLOOD PRESSURE SUPPORT)
PREFILLED_SYRINGE | INTRAVENOUS | Status: DC | PRN
  Administered 2024-04-23: 160 ug via INTRAVENOUS
  Administered 2024-04-23 (×2): 80 ug via INTRAVENOUS
  Administered 2024-04-23: 160 ug via INTRAVENOUS
  Administered 2024-04-23 (×3): 80 ug via INTRAVENOUS

## 2024-04-23 MED ORDER — LORAZEPAM 1 MG PO TABS: 0.5000 mg | ORAL_TABLET | Freq: Three times a day (TID) | ORAL | Status: DC | PRN

## 2024-04-23 MED ORDER — ONDANSETRON HCL 4 MG/2ML IJ SOLN
INTRAMUSCULAR | Status: DC | PRN
  Administered 2024-04-23: 4 mg via INTRAVENOUS

## 2024-04-23 MED ORDER — POLYETHYLENE GLYCOL 3350 17 G PO PACK
17.0000 g | PACK | Freq: Every day | ORAL | Status: DC | PRN
  Administered 2024-04-24 – 2024-04-25 (×2): 17 g via ORAL
  Filled 2024-04-23 (×3): qty 1

## 2024-04-23 MED ORDER — SENNOSIDES-DOCUSATE SODIUM 8.6-50 MG PO TABS
2.0000 | ORAL_TABLET | Freq: Two times a day (BID) | ORAL | Status: DC
  Administered 2024-04-23 – 2024-04-26 (×6): 2 via ORAL
  Filled 2024-04-23 (×6): qty 2

## 2024-04-23 MED ORDER — ACETAMINOPHEN 10 MG/ML IV SOLN
INTRAVENOUS | Status: AC
  Filled 2024-04-23: qty 100

## 2024-04-23 MED ORDER — ORAL CARE MOUTH RINSE: 15.0000 mL | Freq: Once | OROMUCOSAL | Status: AC

## 2024-04-23 MED ORDER — LACTATED RINGERS IV SOLN: INTRAVENOUS | Status: DC

## 2024-04-23 MED ORDER — MIDAZOLAM HCL (PF) 2 MG/2ML IJ SOLN
INTRAMUSCULAR | Status: DC | PRN
Start: 2024-04-23 — End: 2024-04-23
  Administered 2024-04-23: 1 mg via INTRAVENOUS

## 2024-04-23 SURGICAL SUPPLY — 52 items
BAG COUNTER SPONGE SURGICOUNT (BAG) ×1 IMPLANT
BENZOIN TINCTURE PRP APPL 2/3 (GAUZE/BANDAGES/DRESSINGS) ×1 IMPLANT
BLADE CLIPPER SURG (BLADE) IMPLANT
BUR MATCHSTICK NEURO 3.0 LAGG (BURR) ×1 IMPLANT
CAGE COROENT XLT 8X16X30 (Cage) IMPLANT
CANISTER SUCTION 3000ML PPV (SUCTIONS) ×1 IMPLANT
CATH ROBINSON RED A/P 16FR (CATHETERS) IMPLANT
CLSR STERI-STRIP ANTIMIC 1/2X4 (GAUZE/BANDAGES/DRESSINGS) IMPLANT
CNTNR URN SCR LID CUP LEK RST (MISCELLANEOUS) ×1 IMPLANT
COVER BACK TABLE 60X90IN (DRAPES) ×1 IMPLANT
DERMABOND ADVANCED .7 DNX12 (GAUZE/BANDAGES/DRESSINGS) ×1 IMPLANT
DRAPE C-ARM 42X72 X-RAY (DRAPES) ×2 IMPLANT
DRAPE HALF SHEET 40X57 (DRAPES) IMPLANT
DRAPE LAPAROTOMY 100X72X124 (DRAPES) ×1 IMPLANT
DRAPE SURG 17X23 STRL (DRAPES) ×4 IMPLANT
DRSG OPSITE POSTOP 4X6 (GAUZE/BANDAGES/DRESSINGS) IMPLANT
ELECT NVM5 SURFACE MEP/EMG (ELECTRODE) IMPLANT
ELECTRODE REM PT RTRN 9FT ADLT (ELECTROSURGICAL) ×1 IMPLANT
EVACUATOR 1/8 PVC DRAIN (DRAIN) ×1 IMPLANT
GAUZE 4X4 16PLY ~~LOC~~+RFID DBL (SPONGE) IMPLANT
GAUZE SPONGE 4X4 12PLY STRL (GAUZE/BANDAGES/DRESSINGS) ×1 IMPLANT
GLOVE BIO SURGEON STRL SZ 6 (GLOVE) ×1 IMPLANT
GLOVE BIOGEL PI IND STRL 6.5 (GLOVE) ×1 IMPLANT
GLOVE ECLIPSE 9.0 STRL (GLOVE) ×2 IMPLANT
GOWN STRL REUS W/ TWL LRG LVL3 (GOWN DISPOSABLE) IMPLANT
GOWN STRL REUS W/ TWL XL LVL3 (GOWN DISPOSABLE) ×2 IMPLANT
GOWN STRL REUS W/TWL 2XL LVL3 (GOWN DISPOSABLE) IMPLANT
KIT BASIN OR (CUSTOM PROCEDURE TRAY) ×1 IMPLANT
KIT DILATOR XLIF 5 (KITS) IMPLANT
KIT SURGICAL ACCESS MAXCESS 4 (KITS) IMPLANT
KIT TURNOVER KIT B (KITS) ×1 IMPLANT
MILL MEDIUM DISP (BLADE) IMPLANT
MODULE EMG NDL SSEP NVM5 (NEUROSURGERY SUPPLIES) IMPLANT
MODULE EMG NEEDLE SSEP NVM5 (NEUROSURGERY SUPPLIES) ×1 IMPLANT
NDL HYPO 22X1.5 SAFETY MO (MISCELLANEOUS) ×1 IMPLANT
NEEDLE HYPO 22X1.5 SAFETY MO (MISCELLANEOUS) ×1 IMPLANT
PACK LAMINECTOMY NEURO (CUSTOM PROCEDURE TRAY) ×1 IMPLANT
PENCIL BUTTON HOLSTER BLD 10FT (ELECTRODE) ×1 IMPLANT
PLATE TRAVERSE 30MM (Plate) IMPLANT
SCREW TRAVERSE 5.5X35MM (Screw) IMPLANT
SOLN 0.9% NACL POUR BTL 1000ML (IV SOLUTION) ×1 IMPLANT
SOLN STERILE WATER BTL 1000 ML (IV SOLUTION) ×1 IMPLANT
SPIKE FLUID TRANSFER (MISCELLANEOUS) ×1 IMPLANT
SPONGE INTESTINAL PEANUT (DISPOSABLE) IMPLANT
SPONGE SURGIFOAM ABS GEL 100 (HEMOSTASIS) ×1 IMPLANT
STRIP CLOSURE SKIN 1/2X4 (GAUZE/BANDAGES/DRESSINGS) ×2 IMPLANT
SUT VIC AB 0 CT1 18XCR BRD8 (SUTURE) ×2 IMPLANT
SUT VIC AB 2-0 CT1 18 (SUTURE) ×1 IMPLANT
SUT VIC AB 3-0 SH 8-18 (SUTURE) ×2 IMPLANT
TOWEL GREEN STERILE (TOWEL DISPOSABLE) ×1 IMPLANT
TOWEL GREEN STERILE FF (TOWEL DISPOSABLE) ×1 IMPLANT
TRAY FOLEY MTR SLVR 16FR STAT (SET/KITS/TRAYS/PACK) ×1 IMPLANT

## 2024-04-23 NOTE — Anesthesia Procedure Notes (Signed)
 Procedure Name: Intubation Date/Time: 04/23/2024 1:07 PM  Performed by: Quantrell Splitt C, CRNAPre-anesthesia Checklist: Patient identified, Emergency Drugs available, Suction available and Patient being monitored Patient Re-evaluated:Patient Re-evaluated prior to induction Oxygen Delivery Method: Circle system utilized Preoxygenation: Pre-oxygenation with 100% oxygen Induction Type: IV induction Ventilation: Mask ventilation without difficulty Laryngoscope Size: Mac and 3 Grade View: Grade I Tube type: Oral Endobronchial tube: Double lumen EBT and 37 Fr Number of attempts: 1 Airway Equipment and Method: Stylet and Oral airway Placement Confirmation: ETT inserted through vocal cords under direct vision, positive ETCO2 and breath sounds checked- equal and bilateral Secured at: 21 cm Tube secured with: Tape Dental Injury: Teeth and Oropharynx as per pre-operative assessment

## 2024-04-23 NOTE — Anesthesia Procedure Notes (Signed)
 Arterial Line Insertion Start/End10/27/2025 1:22 PM, 04/23/2024 1:27 PM Performed by: Jamare Vanatta C, CRNA, CRNA  Patient location: OR. Preanesthetic checklist: patient identified, IV checked, site marked, risks and benefits discussed, surgical consent, monitors and equipment checked, pre-op evaluation, timeout performed and anesthesia consent Lidocaine  1% used for infiltration Left, radial was placed Catheter size: 20 G Hand hygiene performed  and maximum sterile barriers used   Attempts: 1 Ultrasound Notes:relevant anatomy identified, ultrasound used to visualize needle entry and vessel patent under ultrasound. Following insertion, dressing applied. Post procedure assessment: normal and unchanged  Patient tolerated the procedure well with no immediate complications.

## 2024-04-23 NOTE — Progress Notes (Signed)
 Dr. Louis made aware of report from [facility] Radiology- Moderate to large right pneumothorax. MD states nothing to do at this time, fluid will absorb.

## 2024-04-23 NOTE — Brief Op Note (Signed)
 04/23/2024  5:59 PM  PATIENT:  Tammy Wilson  58 y.o. female  PRE-OPERATIVE DIAGNOSIS:  Spondylosis with myelopathy, thoractic region  POST-OPERATIVE DIAGNOSIS:  Spondylosis with myelopathy, thoracic region  PROCEDURE:  Procedure(s): THORACIC FUSION RIGHT Thoracic six-Thoracic seven-Thoracic seven-Thoracic eight transthoracic microdiscectomy with lateral interbody fusion (Right)  SURGEON:  Surgeons and Role:    DEWAINE Louis Shove, MD - Primary  PHYSICIAN ASSISTANT:   ASSISTANTSBETHA Jennetta PIETY   ANESTHESIA:   general  EBL:  200 mL   BLOOD ADMINISTERED:none  DRAINS: none   LOCAL MEDICATIONS USED:  MARCAINE      SPECIMEN:  No Specimen  DISPOSITION OF SPECIMEN:  N/A  COUNTS:  YES  TOURNIQUET:  * No tourniquets in log *  DICTATION: .Dragon Dictation  PLAN OF CARE: Admit to inpatient   PATIENT DISPOSITION:  PACU - hemodynamically stable.   Delay start of Pharmacological VTE agent (>24hrs) due to surgical blood loss or risk of bleeding: yes

## 2024-04-23 NOTE — Transfer of Care (Signed)
 Immediate Anesthesia Transfer of Care Note  Patient: Tammy Wilson  Procedure(s) Performed: THORACIC FUSION RIGHT Thoracic six-Thoracic seven-Thoracic seven-Thoracic eight transthoracic microdiscectomy with lateral interbody fusion (Right: Spine Thoracic)  Patient Location: PACU  Anesthesia Type:General  Level of Consciousness: sedated  Airway & Oxygen Therapy: Patient Spontanous Breathing and Patient connected to face mask oxygen  Post-op Assessment: Report given to RN and Post -op Vital signs reviewed and stable  Post vital signs: Reviewed and stable  Last Vitals:  Vitals Value Taken Time  BP 91/59 04/23/24 18:30  Temp 98   Pulse 64 04/23/24 18:37  Resp 13 04/23/24 18:37  SpO2 95 % 04/23/24 18:37  Vitals shown include unfiled device data.  Last Pain:  Vitals:   04/23/24 0953  TempSrc:   PainSc: 0-No pain         Complications: No notable events documented.

## 2024-04-23 NOTE — Anesthesia Preprocedure Evaluation (Addendum)
 Anesthesia Evaluation  Patient identified by MRN, date of birth, ID band Patient awake    Reviewed: Allergy & Precautions, NPO status , Patient's Chart, lab work & pertinent test results, reviewed documented beta blocker date and time   History of Anesthesia Complications (+) PONV and history of anesthetic complications  Airway Mallampati: II       Dental no notable dental hx.    Pulmonary neg COPD   breath sounds clear to auscultation       Cardiovascular  Rhythm:Regular Rate:Normal  IMPRESSIONS     1. Left ventricular ejection fraction, by estimation, is 55 to 60%. The  left ventricle has normal function. The left ventricle has no regional  wall motion abnormalities. There is mild asymmetric left ventricular  hypertrophy of the septal segment. Left  ventricular diastolic parameters are consistent with Grade I diastolic  dysfunction (impaired relaxation).   2. Right ventricular systolic function is normal. The right ventricular  size is normal. The estimated right ventricular systolic pressure is 26.2  mmHg.   3. Mild bileaflet mitral valve prolapse. The mitral valve is  degenerative. Mild mitral valve regurgitation. No evidence of mitral  stenosis.   4. The aortic valve is tricuspid. There is mild calcification of the  aortic valve. Aortic valve regurgitation is trivial. No aortic stenosis is  present.   5. The inferior vena cava is normal in size with greater than 50%  respiratory variability, suggesting right atrial pressure of 3 mmHg.     Neuro/Psych neg Seizures PSYCHIATRIC DISORDERS Anxiety Depression     Neuromuscular disease    GI/Hepatic ,GERD  Medicated,,  Endo/Other  Hypothyroidism    Renal/GU Renal disease     Musculoskeletal   Abdominal   Peds  Hematology   Anesthesia Other Findings   Reproductive/Obstetrics                              Anesthesia  Physical Anesthesia Plan  ASA: 2  Anesthesia Plan: General   Post-op Pain Management: Ofirmev  IV (intra-op)*   Induction: Intravenous  PONV Risk Score and Plan: 4 or greater and Ondansetron , Dexamethasone , Treatment may vary due to age or medical condition, Midazolam  and Scopolamine  patch - Pre-op  Airway Management Planned: Double Lumen EBT  Additional Equipment: Arterial line  Intra-op Plan:   Post-operative Plan: Extubation in OR  Informed Consent: I have reviewed the patients History and Physical, chart, labs and discussed the procedure including the risks, benefits and alternatives for the proposed anesthesia with the patient or authorized representative who has indicated his/her understanding and acceptance.     Dental advisory given  Plan Discussed with: CRNA  Anesthesia Plan Comments:          Anesthesia Quick Evaluation

## 2024-04-23 NOTE — Op Note (Signed)
 Date of procedure: 04/23/2024  Date of dictation: Same  Service: Neurosurgery  Preoperative diagnosis: T6-T7 herniated nucleus pulposus with myelopathy, T7-T8 herniated nucleus pulposus with myelopathy  Postoperative diagnosis: Same  Procedure Name: Right trans thoracic microdiscectomy T6-T7 and T7-T8, microdissection  T6-T7 lateral interbody fusion utilizing interbody cage and autograft  T7-T8 lateral interbody fusion utilizing interbody cage and autograft  Lateral plate fixation from T6-T8  Right rib harvest for autograft  Intraoperative SSEPs and motor evoked potentials  Surgeon:Aliani Caccavale A.Sameer Teeple, M.D.  Asst. Surgeon: Jennetta, NP  Assistant utilized for exposure, decompression, fusion, instrumentation and closure  Anesthesia: General  Indication: After induction of anesthesia, patient positioned in the left lateral decubitus position and appropriately padded.  Localizing fluoroscopy was used to localize the T6-7 and T7-T8 disc spaces.  Planned incision was marked.  Chest was prepped and draped sterilely.  Incision was made overlying a rib just below the scapula on the right side.  This carried down sharply  Operative note: The T8 rib.  The rib was dissected free and cleared of periosteum.  Rib shears were used to harvest the rib for autograft.  Proximal rib was further resected using Leksell rongeur's.  The parietal pleura membrane was dissected free and identified.  The parietal pleura was carefully mobilized from the chest wall and swept down along the rib heads of T6-T7 and T8.  The pleura was further mobilized medially exposing the disc spaces at T7-8 and T6-7.  X-ray was taken with fluoroscopy and levels were confirmed.  NuVasive self-retaining retractor was placed and the right long and pleura was retracted medially providing exposure of the lateral thoracic spine.  Disc bases at T6-7 and T7-8 were incised and discectomies were performed using pituitary rongeurs and Kerrison  rongeurs.  The rib heads of T7 and T8 were resected.  Microscope brought to the field using microsuction the spinal canal starting first at T6-7 free disc herniation was dissected free and resected.  Decompression continued dorsally exposing the posterior logical ligament.  The posterior logical ligament and epidural space was dissected free.  There was a large disc herniation enveloped within the posterior logical ligament which was dissected free and removed using pituitary rongeurs and Kerrison rongeurs.  At this point a very thorough decompression of the T6-7 disc base had been achieved.  There is no evidence of injury to the thecal sac or nerve roots.  Intraoperative monitoring demonstrated no events.  Gelfoam was placed over the discectomy site.  Attention was then placed to the T7-T8 level which was performed in a similar fashion with similar findings as the T6-7 level.  Again there was no evidence of injury to the thecal sac or nerve roots.  No evidence of any events on monitoring.  At this point very thorough decompression of the spinal canal and been achieved.  Preparation is then made for interbody fusion.  Microscope was removed from the field.  Disc spaces were prepared for interbody fusion.  A gentle contralateral release was performed with Cobb elevators as the aorta was lying adjacent to the left side of the spine.  The disc bases were measured and 8 mm x 14 mm x 30 mm cages were found to be appropriate.  NuVasive interbody peek cages were then packed with the morselized rib which had been taken earlier and harvest.  Cage was first impacted at the T6-7 level with good appearance in both the AP and lateral plane.  Cage was then impacted at the T7-T8 level again using 8 mm  x 14 mm x 30 mm implant packed with autograft rib.  Each cage was found to be at the right operative level with good position both the AP and lateral plane.  Lateral titanium plate was then placed over the T6-T7 and T8 levels.  This  was then anchored into the spine using a bicortical screws to each at T6 and T8.  Screws, final tightening and locking mechanisms were engaged.  Final images reveal good position of the cages and the hardware at the proper level with normal alignment of the spine.  Retractor system was removed.  Parietal pleura was examined and there was no evidence of any violation.  The chest cavity was filled with irrigation and there is no evidence of air leak.  Most of the irrigation was suctioned free.  The thoracic fascia was reapproximated using 0 Vicryl suture.  Prior to placement of the last suture a red rubber catheter was placed in the pleural space and exited into a basin of water .  No evidence of air leak.  The chest reexpanded.  The red rubber was removed and the suture was tightened around it.  Wound was then closed in layers with Vicryl sutures.  Steri-Strips and sterile dressing were applied.  No apparent complications.  Patient tolerated the procedure well and she returns to the recovery room postop.

## 2024-04-23 NOTE — H&P (Signed)
 Tammy Wilson is an 58 y.o. female.   Chief Complaint: Weakness   HPI: 58 year old female with bilateral lower extremity weakness sensory loss and gait instability.  Workup demonstrates evidence of a large T6-T7 central disc herniation with severe cord compression.  Patient also with a T7-T8 central disc herniation with moderately severe stenosis and early cord compression.  Patient presents now for right sided T6-7 and right-sided T7-T8 transthoracic microdiscectomy with interbody fusion utilizing interbody cage, locally harvested autograft, and lateral plate fixation.  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   bladder tack     GASTRIC ROUX-EN-Y N/A 09/08/2020   Procedure: LAPAROSCOPIC ROUX-EN-Y GASTRIC BYPASS WITH UPPER ENDOSCOPY, CONVERSION FROM LAPAROSCOPIC SLEEVE GASTECTOMY;  Surgeon: Tammy Cough, MD;  Location: [facility];  Service: General;  Laterality: N/A;  3.5 HOURS TOTAL PLEASE   HIATAL HERNIA REPAIR N/A 09/08/2020   Procedure: HERNIA REPAIR HIATAL;  Surgeon: Tammy Cough, MD;  Location: [facility];  Service: General;  Laterality: N/A;   LAPAROSCOPIC GASTRIC SLEEVE RESECTION N/A 06/09/2015   Procedure: LAPAROSCOPIC GASTRIC SLEEVE RESECTION;  Surgeon: Wilson Gladis, MD;  Location: [facility];  Service: General;  Laterality: N/A;   NECK SURGERY     Cervial fusion '12-[facility] - Dr. Louis    SINUSOTOMY     THYROIDECTOMY  2019   TUBAL LIGATION     UPPER GI ENDOSCOPY N/A 06/09/2015   Procedure: UPPER GI ENDOSCOPY;  Surgeon: Wilson Gladis, MD;  Location: [facility];  Service: General;  Laterality: N/A;   UPPER GI ENDOSCOPY N/A 09/08/2020   Procedure: UPPER GI ENDOSCOPY;  Surgeon: Tammy Cough, MD;  Location: [facility];  Service: General;  Laterality: N/A;    Family History  Problem Relation Age of Onset   Heart disease Mother    Rheum arthritis Mother    Colon cancer Neg Hx    Esophageal cancer Neg Hx    Stomach cancer Neg Hx    Rectal cancer Neg Hx    Social History:  reports that she has never smoked. She has never used smokeless tobacco. She reports that she does not currently use alcohol. She reports that she does not use drugs.  Allergies:  Allergies  Allergen Reactions   Hydromorphone Other (See Comments)   Dilaudid [Hydromorphone Hcl] Palpitations   Sulfa Antibiotics Itching and Other (See Comments)    Possible rash    Medications Prior to Admission  Medication Sig Dispense Refill   buPROPion  (WELLBUTRIN  XL) 300 MG 24 hr tablet Take 300 mg by mouth daily.     Cyanocobalamin  (VITAMIN B-12 PO) Take 1 tablet by mouth daily.     FIBER PO Take 4-5 Doses by mouth at bedtime.     levothyroxine  (SYNTHROID ) 100 MCG tablet Take 100 mcg by mouth at bedtime.     LORazepam  (ATIVAN ) 0.5 MG tablet Take 0.5 mg  by mouth every 8 (eight) hours as needed for anxiety.     pantoprazole  (PROTONIX ) 40 MG tablet Take 1 tablet (40 mg total) by mouth 2 (two) times daily before a meal. 60 tablet 2   senna-docusate (SENOKOT-S) 8.6-50 MG tablet Take 2 tablets by mouth 2 (two) times daily.     tirzepatide (MOUNJARO) 5 MG/0.5ML Pen Inject 5 mg into the skin once a week.     traZODone  (DESYREL ) 50 MG tablet Take 50 mg by mouth at bedtime as needed for sleep.     venlafaxine  XR (EFFEXOR -XR) 150 MG 24 hr capsule Take 150 mg by mouth daily. with food     venlafaxine  XR (EFFEXOR -XR) 75 MG 24 hr  capsule Take 75 mg by mouth daily.      Results for orders placed or performed during the hospital encounter of 04/23/24 (from the past 48 hours)  Type and screen [facility]     Status: None   Collection Time: 04/23/24 10:08 AM  Result Value Ref Range   ABO/RH(D) A POS    Antibody Screen NEG    Sample Expiration      04/26/2024,2359 Performed at [facility], [address] [place], [place] [address]    No results found.  Pertinent items noted in HPI and remainder of comprehensive ROS otherwise negative.  Blood pressure (!) 143/77, pulse 72, temperature 98.6 F (37 C), temperature source Oral, resp. rate 17, height 5' 7 (1.702 m), weight 61.2 kg, SpO2 96%.  Patient is awake and alert.  She is oriented and appropriate.  Speech is fluent.  Judgment insight are intact.  Cranial nerve function normal bilateral.  Motor examination reveals 5/5 strength in both upper extremities.  Sensory examination normal in both upper extremities.  Motor examination of the lower extremities reveals 4/5 strength with some spasticity.  Sensory examination demonstrates a significant sensory level at the T7 level.  Reflexes are normal active in her upper extremities and brisk in both lower extremities.  Toes are upgoing to plantar stimulation.  Gait is spastic.  Examination head ears eyes nose and throat is unremarkable chest and abdomen are benign.  Extremities are free from injury or deformity. Assessment/Plan T6-T7 and T7-T8 herniated nucleus pulposus with stenosis and myelopathy.  Plan right sided T6-7 and right sided T7-T8 transthoracic microdiscectomy with interbody fusion utilizing interbody cages, locally harvested autograft, and lateral plate fixation.  Risks and benefits been explained.  Patient wishes to proceed.  [name] 04/23/2024, 11:54 AM

## 2024-04-24 ENCOUNTER — Inpatient Hospital Stay (HOSPITAL_COMMUNITY)

## 2024-04-24 DIAGNOSIS — J95811 Postprocedural pneumothorax: Secondary | ICD-10-CM | POA: Diagnosis not present

## 2024-04-24 DIAGNOSIS — J9811 Atelectasis: Secondary | ICD-10-CM | POA: Diagnosis not present

## 2024-04-24 DIAGNOSIS — Z9889 Other specified postprocedural states: Secondary | ICD-10-CM | POA: Diagnosis not present

## 2024-04-24 DIAGNOSIS — R918 Other nonspecific abnormal finding of lung field: Secondary | ICD-10-CM | POA: Diagnosis not present

## 2024-04-24 LAB — CBC
HCT: 29.4 % — ABNORMAL LOW (ref 36.0–46.0)
Hemoglobin: 9.7 g/dL — ABNORMAL LOW (ref 12.0–15.0)
MCH: 29.4 pg (ref 26.0–34.0)
MCHC: 33 g/dL (ref 30.0–36.0)
MCV: 89.1 fL (ref 80.0–100.0)
Platelets: 236 K/uL (ref 150–400)
RBC: 3.3 MIL/uL — ABNORMAL LOW (ref 3.87–5.11)
RDW: 13.3 % (ref 11.5–15.5)
WBC: 7.9 K/uL (ref 4.0–10.5)
nRBC: 0 % (ref 0.0–0.2)

## 2024-04-24 LAB — BASIC METABOLIC PANEL WITH GFR
Anion gap: 5 (ref 5–15)
BUN: 12 mg/dL (ref 6–20)
CO2: 26 mmol/L (ref 22–32)
Calcium: 7.9 mg/dL — ABNORMAL LOW (ref 8.9–10.3)
Chloride: 107 mmol/L (ref 98–111)
Creatinine, Ser: 0.64 mg/dL (ref 0.44–1.00)
GFR, Estimated: >60 mL/min (ref >60)
Glucose, Bld: 98 mg/dL (ref 70–99)
Potassium: 3.8 mmol/L (ref 3.5–5.1)
Sodium: 138 mmol/L (ref 135–145)

## 2024-04-24 NOTE — Progress Notes (Signed)
   04/23/24 2200  Vitals  Temp 98.7 F (37.1 C)  Temp Source Oral  BP 98/61  MAP (mmHg) 72  BP Location Right Arm  BP Method Automatic  Patient Position (if appropriate) Lying  Pulse Rate 77  Pulse Rate Source Monitor  ECG Heart Rate 76  Resp 20  Level of Consciousness  Level of Consciousness Alert  MEWS COLOR  MEWS Score Color Green  Oxygen Therapy  SpO2 98 %  O2 Device Nasal Cannula  O2 Flow Rate (L/min) 2 L/min  MEWS Score  MEWS Temp 0  MEWS Systolic 1  MEWS Pulse 0  MEWS RR 0  MEWS LOC 0  MEWS Score 1   Patient arrived to the Unit via stretcher from PACU. Patient is A&Ox4 and is able to follow commands. Patient c/o moderate pain to the surgical incision. No acute distress noted, remains on 2L . Patient was oriented to unit with call light within reach, no longer needs bair hugger blanket. Temp WNL.

## 2024-04-24 NOTE — Progress Notes (Signed)
 Postop day 1.  Patient with incisional pain and some chest wall pain.  No lower extremity symptoms.  Denies numbness paresthesias dysesthesias or weakness into her lower extremities.  She is afebrile.  Her vital signs are stable.  Her O2 sats are 100% on 2 L.  She is awake and alert.  She is oriented and.  Speech is fluent.  Judgment and insight are intact.  Motor examination reveals 5/5 strength in both lower extremities.  Sensory examination normal.  Wound clean and dry.  Chest clear bilaterally.  Abdomen soft.  Follow-up chest x-ray this morning demonstrates improved aeration of the right lung with minimal right sided pneumothorax.  Hardware intact.  Progressing well.  Mobilize.  Work with incentive spirometry.  Work with pain control.  Okay to mobilize without brace.

## 2024-04-24 NOTE — Evaluation (Signed)
 Occupational Therapy Evaluation Patient Details Name: Tammy Wilson MRN: [id number] DOB: 1966-04-30 Today's Date: 04/24/2024   History of Present Illness   58 yo female s/p 10/27 T6-7 T7-8 microdiscectomy  with cord decompression T6-T7 lateral interbody fusion utilizing interbody cage and autograft T7-T8 lateral interbody fusion utilizing interbody cage and autograft    Lateral plate fixation from T6-T8 Right rib harvest for autograft chest xray (+) R PTX PMH anxiety, CA, cervical fusion, gastric bypass 2022.     Clinical Impressions Patient is s/p T6-7 T7-8 lateral interbody fusion with cage and autograft surgery resulting in functional limitations due to the deficits listed below (see OT problem list). 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 Pt watches 4 and 2 yo grandsons as well. Spouse and daughter plan to give (A) upon d/c. Pt currently with R UE pain and neck pain.  Patient will benefit from skilled OT acutely to increase independence and safety with ADLS to allow discharge HHOT.      If plan is discharge home, recommend the following:   A little help with walking and/or transfers;A little help with bathing/dressing/bathroom;Assist for transportation     Functional Status Assessment   Patient has had a recent decline in their functional status and demonstrates the ability to make significant improvements in function in a reasonable and predictable amount of time.     Equipment Recommendations   BSC/3in1;Other (comment) (RW)     Recommendations for Other Services         Precautions/Restrictions   Precautions Precautions: Back Recall of Precautions/Restrictions: Intact Required Braces or Orthoses: Spinal Brace Spinal Brace: Lumbar corset;Applied in sitting position Restrictions Weight Bearing Restrictions Per Provider Order: No     Mobility Bed Mobility Overal bed mobility: Needs Assistance Bed Mobility: Rolling,  Supine to Sit Rolling: Min assist   Supine to sit: Mod assist, HOB elevated     General bed mobility comments: pt with two present to help elevate from bed surface. pt with HOB 35 degrees able to power up to L side of the bed. Pt with incision on R side    Transfers Overall transfer level: Needs assistance Equipment used: Rolling walker (2 wheels) Transfers: Sit to/from Stand, Bed to chair/wheelchair/BSC Sit to Stand: Min assist           General transfer comment: heavy use of hands      Balance Overall balance assessment: Needs assistance Sitting-balance support: Bilateral upper extremity supported, Feet supported Sitting balance-Leahy Scale: Fair     Standing balance support: Bilateral upper extremity supported, During functional activity, Reliant on assistive device for balance Standing balance-Leahy Scale: Poor                             ADL either performed or assessed with clinical judgement   ADL Overall ADL's : Needs assistance/impaired Eating/Feeding: Set up;Sitting   Grooming: Wash/dry face;Oral care;Sitting;Set up   Upper Body Bathing: Minimal assistance;Sitting   Lower Body Bathing: Moderate assistance   Upper Body Dressing : Minimal assistance   Lower Body Dressing: Moderate assistance   Toilet Transfer: Minimal assistance;Rolling walker (2 wheels)           Functional mobility during ADLs: Minimal assistance;Rolling walker (2 wheels)       Vision Baseline Vision/History: 1 Wears glasses Patient Visual Report: No change from baseline Vision Assessment?: No apparent visual deficits     Perception  Praxis         Pertinent Vitals/Pain Pain Assessment Pain Assessment: Faces Faces Pain Scale: Hurts even more Pain Location: R arm and neck Pain Descriptors / Indicators: Grimacing, Discomfort Pain Intervention(s): Monitored during session, Premedicated before session, Repositioned, Limited activity within patient's  tolerance     Extremity/Trunk Assessment Upper Extremity Assessment Upper Extremity Assessment: Right hand dominant;RUE deficits/detail RUE Deficits / Details: reports pain with any shoulder flexion, pt with functional grasp RUE Coordination: decreased fine motor   Lower Extremity Assessment Lower Extremity Assessment: Defer to PT evaluation   Cervical / Trunk Assessment Cervical / Trunk Assessment: Back Surgery   Communication Communication Communication: No apparent difficulties   Cognition Arousal: Alert Behavior During Therapy: WFL for tasks assessed/performed Cognition: No apparent impairments                               Following commands: Intact       Cueing  General Comments   Cueing Techniques: Verbal cues;Tactile cues  BP monitored with MAP (74-80) 2L [place] 02 initially   Exercises     Shoulder Instructions      Home Living Family/patient expects to be discharged to:: Private residence Living Arrangements: Spouse/significant other Available Help at Discharge: Family;Available PRN/intermittently Type of Home: House Home Access: Stairs to enter [employer] of Steps: 3 Entrance Stairs-Rails: None Home Layout: One level     Bathroom Shower/Tub: Chief Strategy Officer: Handicapped height (1 standard)     Home Equipment: Crutches;Hand held shower head;Adaptive equipment Adaptive Equipment: Reacher Additional Comments: spouse works, great dane called Callie ( 31 yo) daughter will also assist upon d/c      Prior Functioning/Environment Prior Level of Function : Working/employed               ADLs Comments: 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    OT Problem List: Decreased strength;Decreased activity tolerance;Impaired balance (sitting and/or standing);Decreased safety awareness;Decreased knowledge of use of DME or AE;Decreased knowledge of precautions;Cardiopulmonary status  limiting activity   OT Treatment/Interventions: Self-care/ADL training;Therapeutic exercise;Energy conservation;DME and/or AE instruction;Manual therapy;Therapeutic activities;Patient/family education;Balance training      OT Goals(Current goals can be found in the care plan section)   Acute Rehab OT Goals Patient Stated Goal: to get some sleep OT Goal Formulation: With patient Time For Goal Achievement: 05/08/24 Potential to Achieve Goals: Good   OT Frequency:  Min 2X/week    Co-evaluation PT/OT/SLP Co-Evaluation/Treatment: Yes Reason for Co-Treatment: Complexity of the patient's impairments (multi-system involvement);For patient/therapist safety   OT goals addressed during session: ADL's and self-care      AM-PAC OT 6 Clicks Daily Activity     Outcome Measure Help from another person eating meals?: A Little Help from another person taking care of personal grooming?: A Little Help from another person toileting, which includes using toliet, bedpan, or urinal?: A Lot Help from another person bathing (including washing, rinsing, drying)?: A Lot Help from another person to put on and taking off regular upper body clothing?: A Little Help from another person to put on and taking off regular lower body clothing?: A Lot 6 Click Score: 15   End of Session Equipment Utilized During Treatment: Gait belt;Rolling walker (2 wheels);Back brace Nurse Communication: Mobility status;Precautions  Activity Tolerance: Patient tolerated treatment well Patient left: Other (comment) (up in hall with PT)  OT Visit Diagnosis: Unsteadiness on feet (R26.81);Muscle weakness (generalized) (  M62.81)                Time: 8988-8961 OT Time Calculation (min): 27 min Charges:  OT General Charges $OT Visit: 1 Visit OT Evaluation $OT Eval Moderate Complexity: 1 Mod   Brynn, OTR/L  Acute Rehabilitation Services Office: [phone] .   Ely Molt 04/24/2024, 10:45 AM

## 2024-04-24 NOTE — Progress Notes (Signed)
 Orthopedic Tech Progress Note Patient Details:  Tammy Wilson 1966/05/26 [id number]  Ortho Devices Type of Ortho Device: Lumbar corsett Ortho Device/Splint Interventions: Ordered      Efrain A Laquandra Carrillo 04/24/2024, 8:39 AM

## 2024-04-24 NOTE — Evaluation (Signed)
 Physical Therapy Evaluation Patient Details Name: Tammy Wilson MRN: [id number] DOB: 1965/07/21 Today's Date: 04/24/2024  History of Present Illness  58 yo female s/p 10/27 T6-7 T7-8 microdiscectomy  with cord decompression T6-T7 lateral interbody fusion utilizing interbody cage and autograft T7-T8 lateral interbody fusion utilizing interbody cage and autograft    Lateral plate fixation from T6-T8 Right rib harvest for autograft chest xray (+) R PTX PMH anxiety, CA, cervical fusion, gastric bypass 2022.  Clinical Impression   Pt presents with LE weakness, impaired balance, severe post-op back and sternal/rib pain, impaired activity tolerance. Pt to benefit from acute PT to address deficits. Pt ambulated hallway distance with use of RW, overall requiring light assist for mobility at this time. Pt with softer BP vs normal, 90s/60s during session, but stable. Pt instructed in incentive spirometer use. PT to progress mobility as tolerated, and will continue to follow acutely.          If plan is discharge home, recommend the following: A little help with walking and/or transfers;A little help with bathing/dressing/bathroom   Can travel by private vehicle        Equipment Recommendations Rolling walker (2 wheels);BSC/3in1  Recommendations for Other Services       Functional Status Assessment Patient has had a recent decline in their functional status and demonstrates the ability to make significant improvements in function in a reasonable and predictable amount of time.     Precautions / Restrictions Precautions Precautions: Back Recall of Precautions/Restrictions: Intact Precaution/Restrictions Comments: reviewed BLT rules, log roll in/out of bed Required Braces or Orthoses: Spinal Brace Spinal Brace: Lumbar corset;Applied in sitting position Restrictions Weight Bearing Restrictions Per Provider Order: No      Mobility  Bed Mobility Overal bed mobility: Needs Assistance Bed  Mobility: Rolling, Supine to Sit Rolling: Min assist   Supine to sit: Mod assist, HOB elevated, +2 for physical assistance     General bed mobility comments: pt with two present to help elevate from bed surface. pt with HOB 35 degrees able to power up to L side of the bed. Pt with incision on R side    Transfers Overall transfer level: Needs assistance Equipment used: Rolling walker (2 wheels) Transfers: Sit to/from Stand, Bed to chair/wheelchair/BSC Sit to Stand: Min assist           General transfer comment: rise and steady assist, cues for correct hand placement when rising and sitting    Ambulation/Gait Ambulation/Gait assistance: Contact guard assist Gait Distance (Feet): 100 Feet Assistive device: Rolling walker (2 wheels) Gait Pattern/deviations: Step-through pattern, Decreased stride length, Trunk flexed Gait velocity: decr     General Gait Details: close guard for safety, cues for upright posture  Stairs            Wheelchair Mobility     Tilt Bed    Modified Rankin (Stroke Patients Only)       Balance Overall balance assessment: Needs assistance Sitting-balance support: Bilateral upper extremity supported, Feet supported Sitting balance-Leahy Scale: Fair     Standing balance support: Bilateral upper extremity supported, During functional activity, Reliant on assistive device for balance Standing balance-Leahy Scale: Poor                               Pertinent Vitals/Pain Pain Assessment Pain Assessment: Faces Faces Pain Scale: Hurts even more Pain Location: R arm and neck Pain Descriptors / Indicators: Grimacing, Discomfort, Aching Pain Intervention(s):  Monitored during session, Limited activity within patient's tolerance, Repositioned    Home Living Family/patient expects to be discharged to:: Private residence Living Arrangements: Spouse/significant other Available Help at Discharge: Family;Available  PRN/intermittently Type of Home: House Home Access: Stairs to enter Entrance Stairs-Rails: None Entrance Stairs-Number of Steps: 3   Home Layout: One level Home Equipment: Crutches;Hand held shower head;Adaptive equipment Additional Comments: spouse works, great dane called Callie ( 85 yo). 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     Extremity/Trunk Assessment   Upper Extremity Assessment Upper Extremity Assessment: Defer to OT evaluation RUE Deficits / Details: reports pain with any shoulder flexion    Lower Extremity Assessment Lower Extremity Assessment: Generalized weakness    Cervical / Trunk Assessment Cervical / Trunk Assessment: Back Surgery  Communication   Communication Communication: No apparent difficulties    Cognition Arousal: Alert Behavior During Therapy: WFL for tasks assessed/performed                             Following commands: Intact       Cueing Cueing Techniques: Verbal cues, Tactile cues     General Comments General comments (skin integrity, edema, etc.): BP 90s/60s throughout, initially dizzy but resolved with continued mobility    Exercises     Assessment/Plan    PT Assessment Patient needs continued PT services  PT Problem List Decreased strength;Decreased mobility;Decreased activity tolerance;Decreased balance;Decreased knowledge of use of DME;Pain;Decreased knowledge of precautions;Decreased safety awareness;Decreased range of motion       PT Treatment Interventions DME instruction;Therapeutic activities;Gait training;Therapeutic exercise;Patient/family education;Balance training;Stair training;Functional mobility training;Neuromuscular re-education    PT Goals (Current goals can be found in the Care Plan section)  Acute Rehab PT Goals PT Goal  Formulation: With patient Time For Goal Achievement: 05/08/24 Potential to Achieve Goals: Good    Frequency Min 5X/week     Co-evaluation   Reason for Co-Treatment: Complexity of the patient's impairments (multi-system involvement);For patient/therapist safety PT goals addressed during session: Balance;Mobility/safety with mobility OT goals addressed during session: ADL's and self-care       AM-PAC PT 6 Clicks Mobility  Outcome Measure Help needed turning from your back to your side while in a flat bed without using bedrails?: A Little Help needed moving from lying on your back to sitting on the side of a flat bed without using bedrails?: A Little Help needed moving to and from a bed to a chair (including a wheelchair)?: A Little Help needed standing up from a chair using your arms (e.g., wheelchair or bedside chair)?: A Little Help needed to walk in hospital room?: A Little Help needed climbing 3-5 steps with a railing? : A Little 6 Click Score: 18    End of Session Equipment Utilized During Treatment: Back brace Activity Tolerance: Patient tolerated treatment well;Patient limited by pain Patient left: in chair;with call bell/phone within reach;with family/visitor present Nurse Communication: Mobility status PT Visit Diagnosis: Other abnormalities of gait and mobility (R26.89);Muscle weakness (generalized) (M62.81)    Time: 8988-8961 PT Time Calculation (min) (ACUTE ONLY): 27 min   Charges:   PT Evaluation $PT Eval Low Complexity: 1 Low   PT General Charges $$ ACUTE PT VISIT: 1 Visit  Jackson Coffield S, PT DPT Acute Rehabilitation Services Secure Chat Preferred  Office [phone]   Johana FORBES Kingdom 04/24/2024, 2:09 PM

## 2024-04-24 NOTE — Anesthesia Postprocedure Evaluation (Signed)
 Anesthesia Post Note  Patient: Tammy Wilson  Procedure(s) Performed: THORACIC FUSION RIGHT Thoracic six-Thoracic seven-Thoracic seven-Thoracic eight transthoracic microdiscectomy with lateral interbody fusion (Right: Spine Thoracic)     Patient location during evaluation: PACU Anesthesia Type: General Level of consciousness: awake and alert Pain management: pain level controlled Vital Signs Assessment: post-procedure vital signs reviewed and stable Respiratory status: spontaneous breathing, nonlabored ventilation, respiratory function stable and patient connected to nasal cannula oxygen Cardiovascular status: blood pressure returned to baseline and stable Postop Assessment: no apparent nausea or vomiting Anesthetic complications: no   No notable events documented.  Last Vitals:  Vitals:   04/23/24 2300 04/24/24 0300  BP: 113/66 111/66  Pulse: 71 77  Resp: 16 16  Temp: 37.1 C 37.2 C  SpO2: 99% 99%    Last Pain:  Vitals:   04/24/24 0427  TempSrc:   PainSc: 5                  Lynwood MARLA Cornea

## 2024-04-24 NOTE — TOC Initial Note (Signed)
 Transition of Care (TOC) - Initial/Assessment Note  Rayfield Gobble RN,BSN Inpatient Care Management Unit 4NP (Non Trauma)- RN Case Manager See Treatment Team for direct Phone #   Patient Details  Name: Tammy Wilson MRN: [id number] Date of Birth: [birth year]  Transition of Care [facility]) CM/SW Contact:    Gobble Rayfield Hurst, RN Phone Number: 04/24/2024, 2:51 PM  Clinical Narrative:                 Cm spoke with pt at bedside, spouse also present. Discussed potential transition needs for [facility] and DME per therapy recommendations.  List provided for choice- Per CMS guidelines from [email or web address] website with star ratings (copy placed in shadow chart)- pt voiced she would like time to review list- CM will plan to follow up in am.   Discussed DME- RW and BSC - pt would also like to think on these and see how she does moving about tomorrow before making final decision on DME.  (Orders in for RW/BSC).   Address, phone # and PCP all confirmed in Epic, spouse will transport home.   Pt will need orders for HHPT/OT prior to discharge.    Inpatient Care Management will follow and check in with pt in am.   Expected Discharge Plan: Home w Home Health Services Barriers to Discharge: Continued Medical Work up   Patient Goals and CMS Choice Patient states their goals for this hospitalization and ongoing recovery are:: return home CMS Medicare.gov Compare Post Acute Care list provided to:: Patient Choice offered to / list presented to : Patient, Spouse      Expected Discharge Plan and Services   Discharge Planning Services: CM Consult Post Acute Care Choice: Home Health, Durable Medical Equipment Living arrangements for the past 2 months: Single Family Home                 DME Arranged: Bedside commode, Walker rolling         HH Arranged: PT, OT          Prior Living Arrangements/Services Living arrangements for the past 2 months: Single Family Home Lives with::  Spouse Patient language and need for interpreter reviewed:: Yes Do you feel safe going back to the place where you live?: Yes      Need for Family Participation in Patient Care: Yes (Comment) Care giver support system in place?: Yes (comment)   Criminal Activity/Legal Involvement Pertinent to Current Situation/Hospitalization: No - Comment as needed  Activities of Daily Living      Permission Sought/Granted Permission sought to share information with : Facility Industrial/product Designer granted to share information with : Yes, Verbal Permission Granted              Emotional Assessment Appearance:: Appears stated age Attitude/Demeanor/Rapport: Engaged Affect (typically observed): Accepting, Calm, Pleasant Orientation: : Oriented to Self, Oriented to Place, Oriented to  Time, Oriented to Situation Alcohol / Substance Use: Not Applicable Psych Involvement: No (comment)  Admission diagnosis:  Spondylosis with myelopathy, 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] Thoracic myelopathy [M47.14] Patient Active Problem List   Diagnosis Date Noted   Thoracic myelopathy 04/23/2024   Medication management 10/24/2023   PICC (peripherally inserted central catheter) removal 10/24/2023   Infection due to ESBL-producing Escherichia coli 10/24/2023   Pyelonephritis 10/02/2023   Hypokalemia 10/02/2023   Prolonged QT interval 10/02/2023   Mild protein malnutrition 10/02/2023   Hyperglycemia 10/02/2023   Hypomagnesemia 10/02/2023   Malaise and fatigue 02/22/2022   B12 deficiency 09/09/2021   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   Gastric bypass status for obesity 09/08/2020   S/P gastric bypass 09/08/2020   Mediastinal adenopathy 08/27/2016   Anxiety 08/18/2016   GERD (gastroesophageal reflux disease) 08/18/2016   Hypocalcemia 08/18/2016   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, [place] - [address] [facility] [place] [address] Phone: [phone] Fax: [phone]     Social Drivers of Health (SDOH) Social History: SDOH Screenings   Food Insecurity: Low Risk  (10/10/2023)   Received from [facility]  Housing: Low Risk  (10/10/2023)   Received from [facility]  Transportation Needs: No Transportation Needs (10/10/2023)   Received from [facility]  Utilities: Low Risk  (10/10/2023)   Received from [facility]  Depression (PHQ2-9): Low Risk  (07/29/2020)  Tobacco Use: Low Risk  (04/23/2024)   SDOH Interventions:     Readmission Risk Interventions     No data to display

## 2024-04-25 ENCOUNTER — Inpatient Hospital Stay (HOSPITAL_COMMUNITY)

## 2024-04-25 NOTE — TOC Progression Note (Signed)
 Transition of Care (TOC) - Progression Note  Rayfield Gobble RN,BSN Inpatient Care Management Unit 4NP (Non Trauma)- RN Case Manager See Treatment Team for direct Phone #   Patient Details  Name: Tammy Wilson MRN: [id number] Date of Birth: 1966/06/19  Transition of Care [facility]) CM/SW Contact  Gobble Rayfield Hurst, RN Phone Number: 04/25/2024, 11:55 AM  Clinical Narrative:    Follow up done with pt at bedside for [facility] choice and DME needs.   Per pt she has decided that she will not need BSC, and she also voiced that she has RW that she use- DME declined for discharge .  HH choice is [facility] with [facility] as backup- CM will make referral once orders placed.   Orders needed for HHPT/OT   CM will continue to follow for [facility] referral pending orders.    Expected Discharge Plan: Home w Home Health Services Barriers to Discharge: Continued Medical Work up               Expected Discharge Plan and Services   Discharge Planning Services: CM Consult Post Acute Care Choice: Home Health, Durable Medical Equipment Living arrangements for the past 2 months: Single Family Home                 DME Arranged: Patient refused services DME Agency: NA       HH Arranged: PT, OT           Social Drivers of Health (SDOH) Interventions SDOH Screenings   Food Insecurity: Low Risk  (10/10/2023)   Received from [facility]  Housing: Low Risk  (10/10/2023)   Received from [facility]  Transportation Needs: No Transportation Needs (10/10/2023)   Received from [facility]  Utilities: Low Risk  (10/10/2023)   Received from [facility]  Depression (PHQ2-9): Low Risk  (07/29/2020)  Tobacco Use: Low Risk  (04/23/2024)    Readmission Risk Interventions     No data to display

## 2024-04-25 NOTE — Progress Notes (Signed)
 Postop day 2.  Overall doing well.  Still with quite a bit of incisional pain and pain around her right shoulder.  Patient is mobilizing.  Able to walk in the hall yesterday.  No lower extremity symptoms.  Afebrile.  Vital signs are stable.  She is awake and alert.  She is oriented and appropriate.  Wound is clean and dry.  Motor and sensory function intact.  Follow-up chest x-ray with a tiny residual right apical pneumothorax.  Some atelectasis but overall things look good.  No need to continue with chest x-rays.  Overall progressing well.  Continue efforts at pain control and mobilization.  Hopefully home over the next couple days.

## 2024-04-25 NOTE — Treatment Plan (Signed)
 Occupational Therapy Treatment Patient Details Name: Tammy Wilson MRN: [id number] DOB: 01-Dec-1965 Today's Date: 04/25/2024   History of present illness 58 yo female s/p 10/27 T6-7 T7-8 microdiscectomy  with cord decompression T6-T7 lateral interbody fusion utilizing interbody cage and autograft T7-T8 lateral interbody fusion utilizing interbody cage and autograft    Lateral plate fixation from T6-T8 Right rib harvest for autograft chest xray (+) R PTX PMH anxiety, CA, cervical fusion, gastric bypass 2022.   OT comments  Pt progressing well toward goals and no home health recommended at this time. Pt meeting several goals. Pt verbalized MD has d/c the LSO brace. Pt and spouse present for all back precautions with adls. Ot to follow acutely.       If plan is discharge home, recommend the following:  A little help with walking and/or transfers;A little help with bathing/dressing/bathroom;Assist for transportation   Equipment Recommendations  BSC/3in1;Other (comment) (RW)    Recommendations for Other Services      Precautions / Restrictions Precautions Precautions: Back       Mobility Bed Mobility               General bed mobility comments: oob on arrival    Transfers Overall transfer level: Needs assistance Equipment used: Rolling walker (2 wheels) Transfers: Sit to/from Stand Sit to Stand: Supervision                 Balance Overall balance assessment: Needs assistance   Sitting balance-Leahy Scale: Good       Standing balance-Leahy Scale: Fair                             ADL either performed or assessed with clinical judgement   ADL Overall ADL's : Needs assistance/impaired                     Lower Body Dressing: Modified independent;Sit to/from stand (figure 4 cross)   Statistician: Supervision/safety;Rolling walker (2 wheels)   Toileting- Architect and Hygiene: Supervision/safety Toileting - Clothing  Manipulation Details (indicate cue type and reason): educated on bidet attachement for peri care            Extremity/Trunk Assessment Upper Extremity Assessment Upper Extremity Assessment: RUE deficits/detail RUE Deficits / Details: pain with shoulder flexion at ribs. pt is able to reach head   Lower Extremity Assessment Lower Extremity Assessment: Defer to PT evaluation        Vision   Vision Assessment?: No apparent visual deficits   Perception     Praxis     Communication Communication Communication: No apparent difficulties   Cognition Arousal: Alert Behavior During Therapy: WFL for tasks assessed/performed Cognition: No apparent impairments                                        Cueing      Exercises      Shoulder Instructions       General Comments VSS on RA- encourage spirometer    Pertinent Vitals/ Pain       Pain Assessment Pain Assessment: Faces Faces Pain Scale: Hurts little more Pain Location: ribs R shoulder flexion Pain Descriptors / Indicators: Sore Pain Intervention(s): Monitored during session, Premedicated before session, Repositioned, Limited activity within patient's tolerance  Home Living  Prior Functioning/Environment              Frequency  Min 2X/week        Progress Toward Goals  OT Goals(current goals can now be found in the care plan section)  Progress towards OT goals: Progressing toward goals  Acute Rehab OT Goals Patient Stated Goal: to be able to go home OT Goal Formulation: With patient Time For Goal Achievement: 05/08/24 Potential to Achieve Goals: Good ADL Goals Pt Will Perform Lower Body Dressing: with set-up;with adaptive equipment;sit to/from stand (met) Pt Will Transfer to Toilet: with set-up;ambulating;bedside commode Pt Will Perform Tub/Shower Transfer: Tub transfer;with min assist;ambulating;shower seat;rolling  walker Additional ADL Goal #1: pt will complete bed mobility supervision (A) as precursor to adls. Additional ADL Goal #2: pt will don doff back brace mod I (brace d/c so goal met)  Plan      Co-evaluation                 AM-PAC OT 6 Clicks Daily Activity     Outcome Measure   Help from another person eating meals?: None Help from another person taking care of personal grooming?: None Help from another person toileting, which includes using toliet, bedpan, or urinal?: A Little Help from another person bathing (including washing, rinsing, drying)?: A Little Help from another person to put on and taking off regular upper body clothing?: None Help from another person to put on and taking off regular lower body clothing?: A Little 6 Click Score: 21    End of Session Equipment Utilized During Treatment: Rolling walker (2 wheels)  OT Visit Diagnosis: Unsteadiness on feet (R26.81);Muscle weakness (generalized) (M62.81)   Activity Tolerance Patient tolerated treatment well   Patient Left in chair;with call bell/phone within reach;with family/visitor present   Nurse Communication Mobility status;Precautions        Time: 8575-8544 OT Time Calculation (min): 31 min  Charges: OT General Charges $OT Visit: 1 Visit OT Treatments $Self Care/Home Management : 23-37 mins   Brynn, OTR/L  Acute Rehabilitation Services Office: [phone] .   Ely Molt 04/25/2024, 3:03 PM

## 2024-04-25 NOTE — Progress Notes (Signed)
 Physical Therapy Treatment Patient Details Name: Tammy Wilson MRN: [id number] DOB: August 29, 1965 Today's Date: 04/25/2024   History of Present Illness 58 yo female s/p 10/27 T6-7 T7-8 microdiscectomy  with cord decompression T6-T7 lateral interbody fusion utilizing interbody cage and autograft T7-T8 lateral interbody fusion utilizing interbody cage and autograft    Lateral plate fixation from T6-T8 Right rib harvest for autograft chest xray (+) R PTX PMH anxiety, CA, cervical fusion, gastric bypass 2022.    PT Comments  Pt tolerates treatment well, ambulating for increased distances and progressing to stair negotiation. Pt requires reinforcement of back precautions to avoid twisting when attempting to dance in the hallway when ambulating. PT encourages frequent mobilization in an effort to return to independence and to further improve pulmonary function. No post-acute PT services appear indicated at this time.    If plan is discharge home, recommend the following: A little help with walking and/or transfers;A little help with bathing/dressing/bathroom   Can travel by private vehicle        Equipment Recommendations  Rolling walker (2 wheels);BSC/3in1    Recommendations for Other Services       Precautions / Restrictions Precautions Precautions: Back Precaution Booklet Issued: Yes (comment) Recall of Precautions/Restrictions: Impaired Precaution/Restrictions Comments: pt twisting when attempting to dance while walking. PT provides verbal reminder of back precautions Required Braces or Orthoses: Spinal Brace Spinal Brace: Lumbar corset;Applied in sitting position Restrictions Weight Bearing Restrictions Per Provider Order: No     Mobility  Bed Mobility                    Transfers Overall transfer level: Needs assistance Equipment used: Rolling walker (2 wheels) Transfers: Sit to/from Stand Sit to Stand: Supervision                 Ambulation/Gait Ambulation/Gait assistance: Supervision Gait Distance (Feet): 300 Feet Assistive device: Rolling walker (2 wheels) Gait Pattern/deviations: Step-through pattern Gait velocity: functional Gait velocity interpretation: 1.31 - 2.62 ft/sec, indicative of limited community ambulator   General Gait Details: steady step-through gait   Stairs Stairs: Yes Stairs assistance: Contact guard assist Stair Management: One rail Right, Alternating pattern Number of Stairs: 4     Wheelchair Mobility     Tilt Bed    Modified Rankin (Stroke Patients Only)       Balance Overall balance assessment: Needs assistance Sitting-balance support: No upper extremity supported, Feet supported Sitting balance-Leahy Scale: Good     Standing balance support: Single extremity supported, Reliant on assistive device for balance Standing balance-Leahy Scale: Poor                              Communication Communication Communication: No apparent difficulties  Cognition Arousal: Alert Behavior During Therapy: WFL for tasks assessed/performed   PT - Cognitive impairments: Memory                       PT - Cognition Comments: reduced recall of back precautions during mobility Following commands: Intact      Cueing Cueing Techniques: Verbal cues  Exercises      General Comments General comments (skin integrity, edema, etc.): VSS on RA      Pertinent Vitals/Pain Pain Assessment Pain Assessment: Faces Faces Pain Scale: Hurts little more Pain Location: ribs Pain Descriptors / Indicators: Sore Pain Intervention(s): Premedicated before session    Home Living  Prior Function            PT Goals (current goals can now be found in the care plan section) Acute Rehab PT Goals Patient Stated Goal: to return to independence Progress towards PT goals: Progressing toward goals    Frequency    Min 5X/week       PT Plan      Co-evaluation              AM-PAC PT 6 Clicks Mobility   Outcome Measure  Help needed turning from your back to your side while in a flat bed without using bedrails?: A Little Help needed moving from lying on your back to sitting on the side of a flat bed without using bedrails?: A Little Help needed moving to and from a bed to a chair (including a wheelchair)?: A Little Help needed standing up from a chair using your arms (e.g., wheelchair or bedside chair)?: A Little Help needed to walk in hospital room?: A Little Help needed climbing 3-5 steps with a railing? : A Little 6 Click Score: 18    End of Session Equipment Utilized During Treatment: Back brace Activity Tolerance: Patient tolerated treatment well Patient left: in chair;with call bell/phone within reach;with family/visitor present Nurse Communication: Mobility status PT Visit Diagnosis: Other abnormalities of gait and mobility (R26.89);Muscle weakness (generalized) (M62.81)     Time: 8877-8859 PT Time Calculation (min) (ACUTE ONLY): 18 min  Charges:    $Gait Training: 8-22 mins PT General Charges $$ ACUTE PT VISIT: 1 Visit                     Bernardino JINNY Ruth, PT, DPT Acute Rehabilitation Office [phone]    Bernardino JINNY Ruth 04/25/2024, 12:08 PM

## 2024-04-26 MED ORDER — OXYCODONE HCL 10 MG PO TABS: 10.0000 mg | ORAL_TABLET | ORAL | 0 refills | Status: DC | PRN

## 2024-04-26 MED ORDER — METHOCARBAMOL 500 MG PO TABS: 500.0000 mg | ORAL_TABLET | Freq: Four times a day (QID) | ORAL | 1 refills | Status: DC | PRN

## 2024-04-26 MED ORDER — GABAPENTIN 300 MG PO CAPS: 300.0000 mg | ORAL_CAPSULE | Freq: Three times a day (TID) | ORAL | 0 refills | Status: AC

## 2024-04-26 NOTE — Progress Notes (Signed)
 Order to discharge patient home. Family at bedside. Discharge instructions/AVS given to and reviewed with patient. Education provided as needed. Patient verbalized understanding. 2 PIVS removed by this RN. Personal belongings sent home with the patient. Home via private vehicle.

## 2024-04-26 NOTE — Progress Notes (Signed)
 Physical Therapy Treatment Patient Details Name: Tammy Wilson MRN: [id number] DOB: [birth year] Today's Date: 04/26/2024   History of Present Illness 58 yo female s/p 10/27 T6-7 T7-8 microdiscectomy  with cord decompression T6-T7 lateral interbody fusion utilizing interbody cage and autograft T7-T8 lateral interbody fusion utilizing interbody cage and autograft    Lateral plate fixation from T6-T8 Right rib harvest for autograft chest xray (+) R PTX PMH anxiety, CA, cervical fusion, gastric bypass 2022.    PT Comments  Pt endorsing continued R/mid chest discomfort and back pain post-op. Pt ambulatory for good hallway dsitance with use of RW, benefits from external support for energy conservation and pain. PT reviewed the importance of frequent mobility once d/c with supervision of spouse, pt expresses understanding. Pt plans to d/c home today, no further questions for PT.     If plan is discharge home, recommend the following: A little help with walking and/or transfers;A little help with bathing/dressing/bathroom   Can travel by private vehicle        Equipment Recommendations  Rolling walker (2 wheels);BSC/3in1    Recommendations for Other Services       Precautions / Restrictions Precautions Precautions: Back Precaution Booklet Issued: Yes (comment) Recall of Precautions/Restrictions: Intact Precaution/Restrictions Comments: pt verbalizing BLT rules, x1 verbal reminder to avoid twisting during gait Required Braces or Orthoses: Spinal Brace Spinal Brace: Lumbar corset;Applied in sitting position (not donned - per Dr. Louis Wilson to mobilize without brace. 10/28 note) Restrictions Weight Bearing Restrictions Per Provider Order: No     Mobility  Bed Mobility Overal bed mobility: Needs Assistance             General bed mobility comments: oob on arrival    Transfers Overall transfer level: Needs assistance Equipment used: Rolling walker (2 wheels) Transfers: Sit  to/from Stand Sit to Stand: Supervision                Ambulation/Gait Ambulation/Gait assistance: Supervision Gait Distance (Feet): 500 Feet Assistive device: Rolling walker (2 wheels) Gait Pattern/deviations: Step-through pattern, Decreased stride length, Trunk flexed Gait velocity: decr     General Gait Details: cues for upright posture, steady gait   Stairs             Wheelchair Mobility     Tilt Bed    Modified Rankin (Stroke Patients Only)       Balance Overall balance assessment: Needs assistance   Sitting balance-Leahy Scale: Good     Standing balance support: Bilateral upper extremity supported, During functional activity Standing balance-Leahy Scale: Fair                              Hotel Manager: No apparent difficulties  Cognition Arousal: Alert Behavior During Therapy: WFL for tasks assessed/performed   PT - Cognitive impairments: Safety/Judgement                       PT - Cognition Comments: min cues for precautions Following commands: Intact      Cueing Cueing Techniques: Verbal cues, Gestural cues  Exercises      General Comments  Home walking program: up and walking 1x/hour during waking hours for short household distances with supervision of family, to promote circulation, activity tolerance, and strength maintenance.        Pertinent Vitals/Pain Pain Assessment Pain Assessment: Faces Faces Pain Scale: Hurts even more Pain Location: chest wall towards R, R shoulder,  back Pain Descriptors / Indicators: Sore, Guarding, Grimacing Pain Intervention(s): Limited activity within patient's tolerance, Monitored during session, Repositioned, Premedicated before session    Home Living                          Prior Function            PT Goals (current goals can now be found in the care plan section) Acute Rehab PT Goals Patient Stated Goal: to return to  independence PT Goal Formulation: With patient Time For Goal Achievement: 05/08/24 Potential to Achieve Goals: Good Progress towards PT goals: Progressing toward goals    Frequency           PT Plan      Co-evaluation              AM-PAC PT 6 Clicks Mobility   Outcome Measure  Help needed turning from your back to your side while in a flat bed without using bedrails?: A Little Help needed moving from lying on your back to sitting on the side of a flat bed without using bedrails?: A Little Help needed moving to and from a bed to a chair (including a wheelchair)?: A Little Help needed standing up from a chair using your arms (e.g., wheelchair or bedside chair)?: A Little Help needed to walk in hospital room?: A Little Help needed climbing 3-5 steps with a railing? : A Little 6 Click Score: 18    End of Session   Activity Tolerance: Patient tolerated treatment well Patient left: in chair;with call bell/phone within reach;with family/visitor present Nurse Communication: Mobility status PT Visit Diagnosis: Other abnormalities of gait and mobility (R26.89);Muscle weakness (generalized) (M62.81)     Time: 8994-8974 PT Time Calculation (min) (ACUTE ONLY): 20 min  Charges:    $Gait Training: 8-22 mins PT General Charges $$ ACUTE PT VISIT: 1 Visit                     Tammy Wilson, PT DPT Acute Rehabilitation Services Secure Chat Preferred  Office [phone]    Tammy Wilson 04/26/2024, 11:06 AM

## 2024-04-26 NOTE — Discharge Instructions (Signed)

## 2024-04-26 NOTE — TOC Transition Note (Signed)
 Transition of Care (TOC) - Discharge Note Rayfield Gobble RN,BSN Inpatient Care Management Unit 4NP (Non Trauma)- RN Case Manager See Treatment Team for direct Phone #   Patient Details  Name: Tammy Wilson MRN: [id number] Date of Birth: 03-19-1966  Transition of Care [facility]) CM/SW Contact:  Gobble Rayfield Hurst, RN Phone Number: 04/26/2024, 11:58 AM   Clinical Narrative:    Pt stable for transition home today, HH orders have been placed per MD for PT/OT.   As per previous conversation with pt- referral made to [facility] per pt choice- Cm spoke with [facility] liaison and placed referral in the [facility]- referral has been accepted. They will try to do start of care visit by Monday 11/ 3  IP CM interventions have been completed no further needs noted.  Family will transport home   Final next level of care: Home w Home Health Services Barriers to Discharge: Barriers Resolved   Patient Goals and CMS Choice Patient states their goals for this hospitalization and ongoing recovery are:: return home CMS Medicare.gov Compare Post Acute Care list provided to:: Patient Choice offered to / list presented to : Patient, Spouse      Discharge Placement               Home  w/ [facility]        Discharge Plan and Services Additional resources added to the After Visit Summary for     Discharge Planning Services: CM Consult Post Acute Care Choice: Home Health, Durable Medical Equipment          DME Arranged: Patient refused services DME Agency: NA       HH Arranged: PT, OT HH Agency: [facility] Date [facility] Agency Contacted: 04/26/24 Time HH Agency Contacted: 1000 Representative spoke with at [facility] Agency: Arna  Social Drivers of Health (SDOH) Interventions SDOH Screenings   Food Insecurity: Low Risk  (10/10/2023)   Received from [facility]  Housing: Low Risk  (10/10/2023)   Received from [facility]  Transportation Needs: No Transportation Needs (10/10/2023)   Received  from [facility]  Utilities: Low Risk  (10/10/2023)   Received from [facility]  Depression (PHQ2-9): Low Risk  (07/29/2020)  Tobacco Use: Low Risk  (04/23/2024)     Readmission Risk Interventions    04/26/2024   11:58 AM  Readmission Risk Prevention Plan  Post Dischage Appt Complete  Medication Screening Complete  Transportation Screening Complete

## 2024-04-26 NOTE — Discharge Summary (Addendum)
 Physician Discharge Summary  Patient ID: Tammy Wilson MRN: [id number] DOB/AGE: Aug 02, 1965 58 y.o.  Admit date: 04/23/2024 Discharge date: 04/26/2024  Admission Diagnoses:  Discharge Diagnoses:  Principal Problem:   Thoracic myelopathy   Discharged Condition: good  Hospital Course: Patient admitted to the hospital where she underwent uncomplicated right sided transthoracic microdiscectomy at T6-7 and T7-8.  Postoperatively doing well.  Preoperative numbness and weakness in the lower extremities resolved.  Patient with expected postoperative pain which has gradually improved.  She is now ambulating.  She is voiding.  She is under good pain control.  She has no neurologic complaints.  She feels ready for discharge home.  Consults:   Significant Diagnostic Studies:   Treatments:   Discharge Exam: Blood pressure 113/66, pulse 75, temperature 98.4 F (36.9 C), temperature source Oral, resp. rate 16, height 5' 7 (1.702 m), weight 61.2 kg, SpO2 100%. Patient is awake and alert.  She is oriented and appropriate.  Speech is fluent.  Judgment insight are intact.  Cranial nerve function normal bilateral.  Motor examination 5/5 bilateral upper and lower extremities.  Wound clean and dry.  Chest clear.  Abdomen soft.  Extremities free from injury or deformity.  Disposition: Discharge disposition: 01-Home or Self Care        Allergies as of 04/26/2024       Reactions   Hydromorphone Other (See Comments)   Dilaudid [hydromorphone Hcl] Palpitations   Sulfa Antibiotics Itching, Other (See Comments)   Possible rash        Medication List     TAKE these medications    buPROPion  300 MG 24 hr tablet Commonly known as: WELLBUTRIN  XL Take 300 mg by mouth daily.   chlorhexidine  4 % external liquid Commonly known as: HIBICLENS  Apply 15 mLs (1 Application total) topically as directed for 30 doses. Use as directed daily for 5 days every other week for 6 weeks.   FIBER PO Take  4-5 Doses by mouth at bedtime.   gabapentin  300 MG capsule Commonly known as: NEURONTIN  Take 1 capsule (300 mg total) by mouth 3 (three) times daily.   levothyroxine  100 MCG tablet Commonly known as: SYNTHROID  Take 100 mcg by mouth at bedtime.   LORazepam  0.5 MG tablet Commonly known as: ATIVAN  Take 0.5 mg by mouth every 8 (eight) hours as needed for anxiety.   methocarbamol  500 MG tablet Commonly known as: ROBAXIN  Take 1 tablet (500 mg total) by mouth every 6 (six) hours as needed for muscle spasms.   mupirocin ointment 2 % Commonly known as: BACTROBAN Place 1 Application into the nose 2 (two) times daily for 60 doses. Use as directed 2 times daily for 5 days every other week for 6 weeks.   Oxycodone  HCl 10 MG Tabs Take 1 tablet (10 mg total) by mouth every 3 (three) hours as needed for severe pain (pain score 7-10).   pantoprazole  40 MG tablet Commonly known as: PROTONIX  Take 1 tablet (40 mg total) by mouth 2 (two) times daily before a meal.   senna-docusate 8.6-50 MG tablet Commonly known as: Senokot-S Take 2 tablets by mouth 2 (two) times daily.   tirzepatide 5 MG/0.5ML Pen Commonly known as: MOUNJARO Inject 5 mg into the skin once a week.   traZODone  50 MG tablet Commonly known as: DESYREL  Take 50 mg by mouth at bedtime as needed for sleep.   venlafaxine  XR 150 MG 24 hr capsule Commonly known as: EFFEXOR -XR Take 150 mg by mouth daily. with food  venlafaxine  XR 75 MG 24 hr capsule Commonly known as: EFFEXOR -XR Take 75 mg by mouth daily.   VITAMIN B-12 PO Take 1 tablet by mouth daily.               Durable Medical Equipment  (From admission, onward)           Start     Ordered   04/23/24 2127  DME Walker rolling  Once       Question:  Patient needs a walker to treat with the following condition  Answer:  Thoracic myelopathy   04/23/24 2127   04/23/24 2127  DME 3 n 1  Once        04/23/24 2127             Signed: [name] 04/26/2024, 8:34 AM

## 2024-04-27 MED FILL — Heparin Sodium (Porcine) Inj 1000 Unit/ML: INTRAMUSCULAR | Qty: 30 | Status: AC

## 2024-04-27 MED FILL — Sodium Chloride IV Soln 0.9%: INTRAVENOUS | Qty: 2000 | Status: AC

## 2024-04-27 NOTE — Progress Notes (Signed)
 Tammy Wilson,  I got patient an apt with you on Monday 11/3 @ 2:40 pm. She said yesterday when she left the hospital that they checked her urine due to odor. I couldn't see any thing about urine in the system. I may have missed it.  I did tell her if she feels like needs to be seen before Monday to go to [facility] over the weekend and can call and cancel apt. Thanks, Jon

## 2024-04-28 ENCOUNTER — Encounter (HOSPITAL_COMMUNITY): Payer: Self-pay

## 2024-04-28 ENCOUNTER — Inpatient Hospital Stay (HOSPITAL_COMMUNITY)
Admission: EM | Admit: 2024-04-28 | Discharge: 2024-05-03 | DRG: 200 | Disposition: A | Discharge status: Home / Self Care | Attending: Family Medicine | Admitting: Family Medicine

## 2024-04-28 ENCOUNTER — Emergency Department (HOSPITAL_COMMUNITY)

## 2024-04-28 ENCOUNTER — Other Ambulatory Visit: Payer: Self-pay

## 2024-04-28 DIAGNOSIS — G959 Disease of spinal cord, unspecified: Secondary | ICD-10-CM | POA: Diagnosis not present

## 2024-04-28 DIAGNOSIS — M4714 Other spondylosis with myelopathy, thoracic region: Secondary | ICD-10-CM | POA: Diagnosis present

## 2024-04-28 DIAGNOSIS — R0789 Other chest pain: Secondary | ICD-10-CM | POA: Diagnosis not present

## 2024-04-28 DIAGNOSIS — Z978 Presence of other specified devices: Secondary | ICD-10-CM | POA: Diagnosis not present

## 2024-04-28 DIAGNOSIS — Z85828 Personal history of other malignant neoplasm of skin: Secondary | ICD-10-CM

## 2024-04-28 DIAGNOSIS — Z885 Allergy status to narcotic agent status: Secondary | ICD-10-CM

## 2024-04-28 DIAGNOSIS — Z90710 Acquired absence of both cervix and uterus: Secondary | ICD-10-CM | POA: Diagnosis not present

## 2024-04-28 DIAGNOSIS — Z4682 Encounter for fitting and adjustment of non-vascular catheter: Secondary | ICD-10-CM | POA: Diagnosis not present

## 2024-04-28 DIAGNOSIS — J9811 Atelectasis: Secondary | ICD-10-CM | POA: Diagnosis present

## 2024-04-28 DIAGNOSIS — K219 Gastro-esophageal reflux disease without esophagitis: Secondary | ICD-10-CM | POA: Diagnosis present

## 2024-04-28 DIAGNOSIS — Z79890 Hormone replacement therapy (postmenopausal): Secondary | ICD-10-CM | POA: Diagnosis not present

## 2024-04-28 DIAGNOSIS — D649 Anemia, unspecified: Secondary | ICD-10-CM | POA: Diagnosis present

## 2024-04-28 DIAGNOSIS — Z882 Allergy status to sulfonamides status: Secondary | ICD-10-CM

## 2024-04-28 DIAGNOSIS — Z87442 Personal history of urinary calculi: Secondary | ICD-10-CM

## 2024-04-28 DIAGNOSIS — J95811 Postprocedural pneumothorax: Secondary | ICD-10-CM | POA: Diagnosis not present

## 2024-04-28 DIAGNOSIS — Z8249 Family history of ischemic heart disease and other diseases of the circulatory system: Secondary | ICD-10-CM

## 2024-04-28 DIAGNOSIS — T8182XA Emphysema (subcutaneous) resulting from a procedure, initial encounter: Secondary | ICD-10-CM | POA: Diagnosis not present

## 2024-04-28 DIAGNOSIS — E890 Postprocedural hypothyroidism: Secondary | ICD-10-CM | POA: Diagnosis present

## 2024-04-28 DIAGNOSIS — Z9884 Bariatric surgery status: Secondary | ICD-10-CM

## 2024-04-28 DIAGNOSIS — F419 Anxiety disorder, unspecified: Secondary | ICD-10-CM | POA: Diagnosis not present

## 2024-04-28 DIAGNOSIS — Z79899 Other long term (current) drug therapy: Secondary | ICD-10-CM | POA: Diagnosis not present

## 2024-04-28 DIAGNOSIS — R079 Chest pain, unspecified: Secondary | ICD-10-CM | POA: Diagnosis not present

## 2024-04-28 DIAGNOSIS — Y838 Other surgical procedures as the cause of abnormal reaction of the patient, or of later complication, without mention of misadventure at the time of the procedure: Secondary | ICD-10-CM | POA: Diagnosis not present

## 2024-04-28 DIAGNOSIS — Z981 Arthrodesis status: Secondary | ICD-10-CM | POA: Diagnosis not present

## 2024-04-28 DIAGNOSIS — Z7985 Long-term (current) use of injectable non-insulin antidiabetic drugs: Secondary | ICD-10-CM

## 2024-04-28 DIAGNOSIS — F321 Major depressive disorder, single episode, moderate: Secondary | ICD-10-CM | POA: Diagnosis not present

## 2024-04-28 DIAGNOSIS — S2241XA Multiple fractures of ribs, right side, initial encounter for closed fracture: Secondary | ICD-10-CM | POA: Diagnosis not present

## 2024-04-28 DIAGNOSIS — R918 Other nonspecific abnormal finding of lung field: Secondary | ICD-10-CM | POA: Diagnosis not present

## 2024-04-28 DIAGNOSIS — Z8261 Family history of arthritis: Secondary | ICD-10-CM | POA: Diagnosis not present

## 2024-04-28 DIAGNOSIS — J939 Pneumothorax, unspecified: Secondary | ICD-10-CM | POA: Diagnosis not present

## 2024-04-28 DIAGNOSIS — J90 Pleural effusion, not elsewhere classified: Secondary | ICD-10-CM | POA: Diagnosis not present

## 2024-04-28 DIAGNOSIS — J948 Other specified pleural conditions: Secondary | ICD-10-CM | POA: Diagnosis not present

## 2024-04-28 DIAGNOSIS — J982 Interstitial emphysema: Secondary | ICD-10-CM | POA: Diagnosis not present

## 2024-04-28 DIAGNOSIS — Z23 Encounter for immunization: Secondary | ICD-10-CM

## 2024-04-28 DIAGNOSIS — T797XXA Traumatic subcutaneous emphysema, initial encounter: Secondary | ICD-10-CM | POA: Diagnosis not present

## 2024-04-28 LAB — CBC WITH DIFFERENTIAL/PLATELET
Abs Immature Granulocytes: 0.03 K/uL (ref 0.00–0.07)
Basophils Absolute: 0 K/uL (ref 0.0–0.1)
Basophils Relative: 0 %
Eosinophils Absolute: 0.2 K/uL (ref 0.0–0.5)
Eosinophils Relative: 4 %
HCT: 29.6 % — ABNORMAL LOW (ref 36.0–46.0)
Hemoglobin: 9.3 g/dL — ABNORMAL LOW (ref 12.0–15.0)
Immature Granulocytes: 1 %
Lymphocytes Relative: 11 %
Lymphs Abs: 0.6 K/uL — ABNORMAL LOW (ref 0.7–4.0)
MCH: 29 pg (ref 26.0–34.0)
MCHC: 31.4 g/dL (ref 30.0–36.0)
MCV: 92.2 fL (ref 80.0–100.0)
Monocytes Absolute: 0.7 K/uL (ref 0.1–1.0)
Monocytes Relative: 12 %
Neutro Abs: 4.1 K/uL (ref 1.7–7.7)
Neutrophils Relative %: 72 %
Platelets: 346 K/uL (ref 150–400)
RBC: 3.21 MIL/uL — ABNORMAL LOW (ref 3.87–5.11)
RDW: 13.4 % (ref 11.5–15.5)
WBC: 5.6 K/uL (ref 4.0–10.5)
nRBC: 0 % (ref 0.0–0.2)

## 2024-04-28 LAB — LIPASE, BLOOD: Lipase: 45 U/L (ref 11–51)

## 2024-04-28 LAB — URINALYSIS, ROUTINE W REFLEX MICROSCOPIC
Bilirubin Urine: NEGATIVE
Glucose, UA: NEGATIVE mg/dL
Hgb urine dipstick: NEGATIVE
Ketones, ur: NEGATIVE mg/dL
Leukocytes,Ua: NEGATIVE
Nitrite: NEGATIVE
Protein, ur: NEGATIVE mg/dL
Specific Gravity, Urine: 1.035 — ABNORMAL HIGH (ref 1.005–1.030)
pH: 7 (ref 5.0–8.0)

## 2024-04-28 LAB — COMPREHENSIVE METABOLIC PANEL WITH GFR
ALT: 14 U/L (ref 0–44)
AST: 21 U/L (ref 15–41)
Albumin: 2.5 g/dL — ABNORMAL LOW (ref 3.5–5.0)
Alkaline Phosphatase: 48 U/L (ref 38–126)
Anion gap: 10 (ref 5–15)
BUN: 9 mg/dL (ref 6–20)
CO2: 27 mmol/L (ref 22–32)
Calcium: 8.1 mg/dL — ABNORMAL LOW (ref 8.9–10.3)
Chloride: 102 mmol/L (ref 98–111)
Creatinine, Ser: 0.57 mg/dL (ref 0.44–1.00)
GFR, Estimated: >60 mL/min (ref >60)
Glucose, Bld: 95 mg/dL (ref 70–99)
Potassium: 4 mmol/L (ref 3.5–5.1)
Sodium: 139 mmol/L (ref 135–145)
Total Bilirubin: 0.4 mg/dL (ref 0.0–1.2)
Total Protein: 6.2 g/dL — ABNORMAL LOW (ref 6.5–8.1)

## 2024-04-28 LAB — TROPONIN I (HIGH SENSITIVITY)
Troponin I (High Sensitivity): 22 ng/L — ABNORMAL HIGH (ref <18)
Troponin I (High Sensitivity): 19 ng/L — ABNORMAL HIGH (ref <18)

## 2024-04-28 MED ORDER — ONDANSETRON HCL 4 MG/2ML IJ SOLN: 4.0000 mg | Freq: Four times a day (QID) | INTRAMUSCULAR | Status: DC | PRN

## 2024-04-28 MED ORDER — IOHEXOL 350 MG/ML SOLN
75.0000 mL | Freq: Once | INTRAVENOUS | Status: AC | PRN
  Administered 2024-04-28: 75 mL via INTRAVENOUS

## 2024-04-28 MED ORDER — ALUM & MAG HYDROXIDE-SIMETH 200-200-20 MG/5ML PO SUSP
30.0000 mL | Freq: Four times a day (QID) | ORAL | Status: DC | PRN
  Administered 2024-04-28 – 2024-04-30 (×2): 30 mL via ORAL
  Filled 2024-04-28 (×2): qty 30

## 2024-04-28 MED ORDER — PANTOPRAZOLE SODIUM 40 MG PO TBEC
40.0000 mg | DELAYED_RELEASE_TABLET | Freq: Two times a day (BID) | ORAL | Status: DC
  Administered 2024-04-28 – 2024-05-03 (×10): 40 mg via ORAL
  Filled 2024-04-28 (×10): qty 1

## 2024-04-28 MED ORDER — MORPHINE SULFATE (PF) 4 MG/ML IV SOLN
4.0000 mg | INTRAVENOUS | Status: DC | PRN
  Administered 2024-04-29 – 2024-05-01 (×8): 4 mg via INTRAVENOUS
  Filled 2024-04-28 (×11): qty 1

## 2024-04-28 MED ORDER — AZITHROMYCIN 500 MG PO TABS
500.0000 mg | ORAL_TABLET | Freq: Every day | ORAL | Status: DC
  Administered 2024-04-28 – 2024-05-01 (×4): 500 mg via ORAL
  Filled 2024-04-28 (×4): qty 1

## 2024-04-28 MED ORDER — BUPROPION HCL ER (XL) 150 MG PO TB24
300.0000 mg | ORAL_TABLET | Freq: Every day | ORAL | Status: DC
  Administered 2024-04-29 – 2024-05-03 (×5): 300 mg via ORAL
  Filled 2024-04-28 (×5): qty 2

## 2024-04-28 MED ORDER — VENLAFAXINE HCL ER 75 MG PO CP24
150.0000 mg | ORAL_CAPSULE | Freq: Every day | ORAL | Status: DC
  Administered 2024-04-29 – 2024-05-03 (×5): 150 mg via ORAL
  Filled 2024-04-28 (×5): qty 2

## 2024-04-28 MED ORDER — ONDANSETRON HCL 4 MG PO TABS: 4.0000 mg | ORAL_TABLET | Freq: Four times a day (QID) | ORAL | Status: DC | PRN

## 2024-04-28 MED ORDER — ACETAMINOPHEN 325 MG PO TABS: 650.0000 mg | ORAL_TABLET | Freq: Four times a day (QID) | ORAL | Status: DC | PRN

## 2024-04-28 MED ORDER — SODIUM CHLORIDE 0.9 % IV SOLN
2.0000 g | INTRAVENOUS | Status: DC
  Administered 2024-04-28 – 2024-04-30 (×3): 2 g via INTRAVENOUS
  Filled 2024-04-28 (×3): qty 20

## 2024-04-28 MED ORDER — GABAPENTIN 300 MG PO CAPS
300.0000 mg | ORAL_CAPSULE | Freq: Three times a day (TID) | ORAL | Status: DC
  Administered 2024-04-28 – 2024-05-03 (×15): 300 mg via ORAL
  Filled 2024-04-28: qty 3
  Filled 2024-04-28 (×4): qty 1
  Filled 2024-04-28: qty 3
  Filled 2024-04-28 (×9): qty 1

## 2024-04-28 MED ORDER — ALUM & MAG HYDROXIDE-SIMETH 200-200-20 MG/5ML PO SUSP
30.0000 mL | Freq: Once | ORAL | Status: AC
  Administered 2024-04-28: 30 mL via ORAL
  Filled 2024-04-28: qty 30

## 2024-04-28 MED ORDER — SENNOSIDES-DOCUSATE SODIUM 8.6-50 MG PO TABS
2.0000 | ORAL_TABLET | Freq: Two times a day (BID) | ORAL | Status: DC
  Administered 2024-04-28 – 2024-05-03 (×10): 2 via ORAL
  Filled 2024-04-28 (×10): qty 2

## 2024-04-28 MED ORDER — LEVOTHYROXINE SODIUM 100 MCG PO TABS
100.0000 ug | ORAL_TABLET | Freq: Every day | ORAL | Status: DC
  Administered 2024-04-29 – 2024-05-03 (×5): 100 ug via ORAL
  Filled 2024-04-28 (×6): qty 1

## 2024-04-28 MED ORDER — VENLAFAXINE HCL ER 75 MG PO CP24
75.0000 mg | ORAL_CAPSULE | Freq: Every day | ORAL | Status: DC
  Administered 2024-04-29 – 2024-05-03 (×5): 75 mg via ORAL
  Filled 2024-04-28 (×5): qty 1

## 2024-04-28 MED ORDER — METHOCARBAMOL 500 MG PO TABS
500.0000 mg | ORAL_TABLET | Freq: Four times a day (QID) | ORAL | Status: DC | PRN
  Administered 2024-04-28 – 2024-05-03 (×4): 500 mg via ORAL
  Filled 2024-04-28 (×5): qty 1

## 2024-04-28 MED ORDER — OXYCODONE HCL 5 MG PO TABS
10.0000 mg | ORAL_TABLET | ORAL | Status: DC | PRN
  Administered 2024-04-28 – 2024-05-03 (×14): 10 mg via ORAL
  Filled 2024-04-28 (×18): qty 2

## 2024-04-28 MED ORDER — MORPHINE SULFATE (PF) 4 MG/ML IV SOLN
4.0000 mg | INTRAVENOUS | Status: DC | PRN
  Administered 2024-04-28: 4 mg via INTRAVENOUS
  Filled 2024-04-28 (×2): qty 1

## 2024-04-28 MED ORDER — TRAZODONE HCL 50 MG PO TABS
50.0000 mg | ORAL_TABLET | Freq: Every evening | ORAL | Status: DC | PRN
  Administered 2024-04-28 – 2024-05-02 (×5): 50 mg via ORAL
  Filled 2024-04-28 (×5): qty 1

## 2024-04-28 MED ORDER — LORAZEPAM 1 MG PO TABS
0.5000 mg | ORAL_TABLET | Freq: Three times a day (TID) | ORAL | Status: DC | PRN
  Administered 2024-04-29: 0.5 mg via ORAL
  Filled 2024-04-28: qty 1

## 2024-04-28 MED ORDER — ACETAMINOPHEN 650 MG RE SUPP: 650.0000 mg | Freq: Four times a day (QID) | RECTAL | Status: DC | PRN

## 2024-04-28 NOTE — ED Triage Notes (Signed)
 Pt c.o right sided lung pain since last night. Pt had thoracic back surgery last week and was discharged on Thursday. Pt c.o severe pain on the right side and sob

## 2024-04-28 NOTE — Plan of Care (Signed)

## 2024-04-28 NOTE — Assessment & Plan Note (Signed)
 Hemothorax vs infection -IR have been consulted for chest tube - Will need to send for culture, LDH, protein, hematocrit

## 2024-04-28 NOTE — H&P (Signed)
 History and Physical    Patient: Tammy Wilson FMW:[id number] DOB: [birth year] DOA: 04/28/2024 DOS: the patient was seen and examined on 04/28/2024 PCP: Jackolyn Darice BROCKS, FNP  Patient coming from: Home  Chief Complaint:  Chief Complaint  Patient presents with   Shortness of Breath   Chest Pain       HPI:  58 y.o. F with obesity, significant weight loss, hypothyroidism and recent thoracic discectomy/fusion by Dr. Louis due to thoracic myelopathy, who presented with right sided chest pain and SOB.  CTA ruled out PE but showed loculated right pleural effusion and subcutaneous emphtysema, small right basilar pneumothorax.    Was doing well at home post-op until day before admission, developed new severe right anterior CP, worse with coughing or movement.  Noted SOB with ambulation.  No orthopnea noted, nor significant LE swelling.  No fever or chills.        Review of Systems  Constitutional:  Negative for chills, fever and malaise/fatigue.  Respiratory:  Positive for shortness of breath.   Cardiovascular:  Positive for chest pain.  All other systems reviewed and are negative.    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   bladder tack     GASTRIC ROUX-EN-Y N/A 09/08/2020   Procedure: LAPAROSCOPIC ROUX-EN-Y GASTRIC BYPASS WITH UPPER ENDOSCOPY, CONVERSION FROM LAPAROSCOPIC SLEEVE GASTECTOMY;   Surgeon: Gladis Cough, MD;  Location: [facility];  Service: General;  Laterality: N/A;  3.5 HOURS TOTAL PLEASE   HIATAL HERNIA REPAIR N/A 09/08/2020   Procedure: HERNIA REPAIR HIATAL;  Surgeon: Gladis Cough, MD;  Location: [facility];  Service: General;  Laterality: N/A;   LAPAROSCOPIC GASTRIC SLEEVE RESECTION N/A 06/09/2015   Procedure: LAPAROSCOPIC GASTRIC SLEEVE RESECTION;  Surgeon: Cough Gladis, MD;  Location: [facility];  Service: General;  Laterality: N/A;   NECK SURGERY     Cervial fusion '12-[facility] - Dr. Louis   SINUSOTOMY     THYROIDECTOMY  2019   TUBAL LIGATION     UPPER GI ENDOSCOPY N/A 06/09/2015   Procedure: UPPER GI ENDOSCOPY;  Surgeon: Cough Gladis, MD;  Location: [facility];  Service: General;  Laterality: N/A;   UPPER GI ENDOSCOPY N/A 09/08/2020   Procedure: UPPER GI ENDOSCOPY;  Surgeon: Gladis Cough, MD;  Location: [facility];  Service: General;  Laterality: N/A;   Social History:  reports that she has never smoked. She has never used smokeless tobacco. She reports that she does not currently use alcohol. She reports that she does not use drugs.  Allergies  Allergen Reactions   Hydromorphone Other (See Comments)   Dilaudid [Hydromorphone Hcl] Palpitations   Sulfa Antibiotics Itching and Other (See Comments)    Possible rash    Family History  Problem Relation Age of Onset   Heart disease Mother    Rheum arthritis Mother    Colon cancer Neg  Hx    Esophageal cancer Neg Hx    Stomach cancer Neg Hx    Rectal cancer Neg Hx     Prior to Admission medications   Medication Sig Start Date End Date Taking? Authorizing Provider  buPROPion  (WELLBUTRIN  XL) 300 MG 24 hr tablet Take 300 mg by mouth daily. 08/06/20   [provider]  chlorhexidine  (HIBICLENS ) 4 % external liquid Apply 15 mLs (1 Application total) topically as directed for 30 doses. Use as directed daily for 5 days every other week for 6 weeks. 04/23/24   Louis Shove, MD  Cyanocobalamin  (VITAMIN B-12 PO) Take 1  tablet by mouth daily.    [provider]  FIBER PO Take 4-5 Doses by mouth at bedtime.    [provider]  gabapentin  (NEURONTIN ) 300 MG capsule Take 1 capsule (300 mg total) by mouth 3 (three) times daily. 04/26/24   Louis Shove, MD  levothyroxine  (SYNTHROID ) 100 MCG tablet Take 100 mcg by mouth at bedtime. 04/05/24   [provider]  LORazepam  (ATIVAN ) 0.5 MG tablet Take 0.5 mg by mouth every 8 (eight) hours as needed for anxiety. 09/09/21   [provider]  methocarbamol  (ROBAXIN ) 500 MG tablet Take 1 tablet (500 mg total) by mouth every 6 (six) hours as needed for muscle spasms. 04/26/24   Louis Shove, MD  mupirocin ointment (BACTROBAN) 2 % Place 1 Application into the nose 2 (two) times daily for 60 doses. Use as directed 2 times daily for 5 days every other week for 6 weeks. 04/23/24 05/23/24  Louis Shove, MD  oxyCODONE  10 MG TABS Take 1 tablet (10 mg total) by mouth every 3 (three) hours as needed for severe pain (pain score 7-10). 04/26/24   Louis Shove, MD  pantoprazole  (PROTONIX ) 40 MG tablet Take 1 tablet (40 mg total) by mouth 2 (two) times daily before a meal. 02/17/24 05/17/24  [place], PA-C  senna-docusate (SENOKOT-S) 8.6-50 MG tablet Take 2 tablets by mouth 2 (two) times daily.    [provider]  tirzepatide CLOYDE) 5 MG/0.5ML Pen Inject 5 mg into the skin once a week. 05/12/22   [provider]  traZODone  (DESYREL ) 50 MG tablet Take 50 mg by mouth at bedtime as needed for sleep.    [provider]  venlafaxine  XR (EFFEXOR -XR) 150 MG 24 hr capsule Take 150 mg by mouth daily. with food 08/06/20   [provider]  venlafaxine  XR (EFFEXOR -XR) 75 MG 24 hr capsule Take 75 mg by mouth daily. 04/12/24   [provider]    Physical Exam: Vitals:   04/28/24 1201 04/28/24 1510 04/28/24 1530 04/28/24 1543  BP:  127/77 120/71   Pulse:  72 75   Resp:  19 17   Temp:    98.1 F (36.7 C)  TempSrc:    Oral   SpO2:  97% 98%   Weight: 61.2 kg     Height: 5' 7 (1.702 m)      Adult female, lying in bed, no acute distress RRR no murmurs, no LE edema Respiratory rate normal, diminished on the right, no rales or wheezes appreciated, appears to have pain with deep inspiration Her right thoracic incision has a dressing over it, which is clean and dry Abdomen soft no tenderness palpation or guarding, ascites Attention normal, affect appropriate, judgment and insight appear normal, face symmetric, speech fluent      Data Reviewed: Discussed with neurosurgery and pulmonology Basic metabolic panel showsNormal electrolytes and renal function  CBC shows no anemia, no leukocytosis Chest x-ray, personally reviewed, shows right-sided pleural effusion CT angiogram shows no pulmonary embolism, does show loculated right-sided pleural effusion    Assessment and Plan: * Pleural effusion on right Hemothorax vs infection - Place chest tube - Send for culture, LDH, protein, hematocrit - Consult IR    Acute anemia Post-op - Trend Hgb - Foloow fluid hematocrit from pleural fluid  Thoracic myelopathy S/p recent T6-8 transthoracic discectomy, fusion, right rib harvest for autograft by Dr. Louis on 10/27 Discussed with Dr. Gillie, chest tube okay from post-surgical standpoint  Moderate major depression (HCC) - Continue venlafaxine , lorazepam , bupropion , trazodone   S/P gastric bypass History of obesity   Postoperative hypothyroidism - Continue levothyroxine          Advance Care Planning: Full code  Consults: Interventional radiology  Family Communication: Husband at the bedside  Severity of Illness: The appropriate patient status for this patient is INPATIENT. Inpatient status is judged to be reasonable and necessary in order to provide the required intensity of service to ensure the patient's safety. The patient's presenting symptoms, physical exam findings, and initial radiographic  and laboratory data in the context of their chronic comorbidities is felt to place them at high risk for further clinical deterioration. Furthermore, it is not anticipated that the patient will be medically stable for discharge from the hospital within 2 midnights of admission.   * I certify that at the point of admission it is my clinical judgment that the patient will require inpatient hospital care spanning beyond 2 midnights from the point of admission due to high intensity of service, high risk for further deterioration and high frequency of surveillance required.*  Author: Lonni SHAUNNA Dalton, MD 04/28/2024 5:44 PM  For on call review [email or web address].

## 2024-04-28 NOTE — Assessment & Plan Note (Signed)
 S/p recent T6-8 transthoracic discectomy, fusion, right rib harvest for autograft by Dr. Louis on 10/27 Discussed with Dr. Gillie, chest tube okay from post-surgical standpoint

## 2024-04-28 NOTE — ED Notes (Signed)
 Patient ambulated to the restroom with assistance from husband.

## 2024-04-28 NOTE — Assessment & Plan Note (Signed)
 Continue levothyroxine 

## 2024-04-28 NOTE — ED Notes (Signed)
 CCMD called for continuous cardiac monitoring.

## 2024-04-28 NOTE — Assessment & Plan Note (Addendum)
 History of obesity

## 2024-04-28 NOTE — ED Provider Notes (Signed)
 [place] EMERGENCY DEPARTMENT AT [facility] Provider Note   CSN: [id number] Arrival date & time: 04/28/24  1152     Patient presents with: Shortness of Breath and Chest Pain   Tammy Wilson is a 58 y.o. female patient with past medical history of GERD, hypothyroidism, status post T6, T7, T8 trans thoracic discectomy with fusion with Dr Louis reports with complaint of right sided chest pain radiates to back for 1 day. Reports this is worse with taking a deep breath in or when moving.  She reports she has had some dry cough with this.  She denies any fever, chills, abdominal pain nausea vomiting or diarrhea.  She tried 10 mg oxycodone  and gabapentin  prior to arrival.    Shortness of Breath Associated symptoms: chest pain   Chest Pain Associated symptoms: shortness of breath        Prior to Admission medications   Medication Sig Start Date End Date Taking? Authorizing Provider  buPROPion  (WELLBUTRIN  XL) 300 MG 24 hr tablet Take 300 mg by mouth daily. 08/06/20   [provider]  chlorhexidine  (HIBICLENS ) 4 % external liquid Apply 15 mLs (1 Application total) topically as directed for 30 doses. Use as directed daily for 5 days every other week for 6 weeks. 04/23/24   Louis Shove, MD  Cyanocobalamin  (VITAMIN B-12 PO) Take 1 tablet by mouth daily.    [provider]  FIBER PO Take 4-5 Doses by mouth at bedtime.    [provider]  gabapentin  (NEURONTIN ) 300 MG capsule Take 1 capsule (300 mg total) by mouth 3 (three) times daily. 04/26/24   Louis Shove, MD  levothyroxine  (SYNTHROID ) 100 MCG tablet Take 100 mcg by mouth at bedtime. 04/05/24   [provider]  LORazepam  (ATIVAN ) 0.5 MG tablet Take 0.5 mg by mouth every 8 (eight) hours as needed for anxiety. 09/09/21   [provider]  methocarbamol  (ROBAXIN ) 500 MG tablet Take 1 tablet (500 mg total) by mouth every 6 (six) hours as needed for muscle spasms. 04/26/24   Louis Shove, MD   mupirocin ointment (BACTROBAN) 2 % Place 1 Application into the nose 2 (two) times daily for 60 doses. Use as directed 2 times daily for 5 days every other week for 6 weeks. 04/23/24 05/23/24  Louis Shove, MD  oxyCODONE  10 MG TABS Take 1 tablet (10 mg total) by mouth every 3 (three) hours as needed for severe pain (pain score 7-10). 04/26/24   Louis Shove, MD  pantoprazole  (PROTONIX ) 40 MG tablet Take 1 tablet (40 mg total) by mouth 2 (two) times daily before a meal. 02/17/24 05/17/24  [place], PA-C  senna-docusate (SENOKOT-S) 8.6-50 MG tablet Take 2 tablets by mouth 2 (two) times daily.    [provider]  tirzepatide CLOYDE) 5 MG/0.5ML Pen Inject 5 mg into the skin once a week. 05/12/22   [provider]  traZODone  (DESYREL ) 50 MG tablet Take 50 mg by mouth at bedtime as needed for sleep.    [provider]  venlafaxine  XR (EFFEXOR -XR) 150 MG 24 hr capsule Take 150 mg by mouth daily. with food 08/06/20   [provider]  venlafaxine  XR (EFFEXOR -XR) 75 MG 24 hr capsule Take 75 mg by mouth daily. 04/12/24   [provider]    Allergies: Hydromorphone, Dilaudid [hydromorphone hcl], and Sulfa antibiotics    Review of Systems  Respiratory:  Positive for shortness of breath.   Cardiovascular:  Positive for chest pain.  Updated Vital Signs BP 120/71   Pulse 75   Temp 98.1 F (36.7 C) (Oral)   Resp 17   Ht 5' 7 (1.702 m)   Wt 61.2 kg   SpO2 98%   BMI 21.13 kg/m   Physical Exam Vitals and nursing note reviewed.  Constitutional:      General: She is not in acute distress.    Appearance: She is not toxic-appearing.  HENT:     Head: Normocephalic and atraumatic.  Eyes:     General: No scleral icterus.    Conjunctiva/sclera: Conjunctivae normal.  Cardiovascular:     Rate and Rhythm: Normal rate and regular rhythm.     Pulses: Normal pulses.     Heart sounds: Normal heart sounds.  Pulmonary:     Effort: Pulmonary effort is  normal. No respiratory distress.     Breath sounds: Normal breath sounds.  Abdominal:     General: Abdomen is flat. Bowel sounds are normal.     Palpations: Abdomen is soft.     Tenderness: There is no abdominal tenderness.  Musculoskeletal:     Right lower leg: No edema.     Left lower leg: No edema.  Skin:    General: Skin is warm and dry.     Findings: No lesion.  Neurological:     General: No focal deficit present.     Mental Status: She is alert and oriented to person, place, and time. Mental status is at baseline.     (all labs ordered are listed, but only abnormal results are displayed) Labs Reviewed  CBC WITH DIFFERENTIAL/PLATELET - Abnormal; Notable for the following components:      Result Value   RBC 3.21 (*)    Hemoglobin 9.3 (*)    HCT 29.6 (*)    Lymphs Abs 0.6 (*)    All other components within normal limits  COMPREHENSIVE METABOLIC PANEL WITH GFR - Abnormal; Notable for the following components:   Calcium 8.1 (*)    Total Protein 6.2 (*)    Albumin  2.5 (*)    All other components within normal limits  URINALYSIS, ROUTINE W REFLEX MICROSCOPIC - Abnormal; Notable for the following components:   Specific Gravity, Urine 1.035 (*)    All other components within normal limits  TROPONIN I (HIGH SENSITIVITY) - Abnormal; Notable for the following components:   Troponin I (High Sensitivity) 22 (*)    All other components within normal limits  TROPONIN I (HIGH SENSITIVITY) - Abnormal; Notable for the following components:   Troponin I (High Sensitivity) 19 (*)    All other components within normal limits  LIPASE, BLOOD    EKG: EKG Interpretation Date/Time:  Saturday April 28 2024 12:04:01 EDT Ventricular Rate:  76 PR Interval:  152 QRS Duration:  88 QT Interval:  398 QTC Calculation: 447 R Axis:   58  Text Interpretation: Normal sinus rhythm Normal ECG When compared with ECG of 02-Oct-2023 04:50, ecg has improved intervals Confirmed by Towana Sharper  [phone]) on 04/28/2024 12:07:43 PM  Radiology: CT Angio Chest PE W and/or Wo Contrast Result Date: 04/28/2024 EXAM: CTA of the Chest with contrast for PE 04/28/2024 02:46:41 PM TECHNIQUE: CTA of the chest was performed without and with the administration of 75 mL of iohexol (OMNIPAQUE) 350 MG/ML injection. Multiplanar reformatted images are provided for review. MIP images are provided for review. Automated exposure control, iterative reconstruction, and/or weight based adjustment of the mA/kV was utilized to reduce the radiation dose to as  low as reasonably achievable. COMPARISON: 05/30/2023 CLINICAL HISTORY: Pulmonary embolism (PE) suspected, high prob. FINDINGS: PULMONARY ARTERIES: Pulmonary arteries are adequately opacified for evaluation. No pulmonary embolism. Main pulmonary artery is normal in caliber. MEDIASTINUM: The heart and pericardium demonstrate no acute abnormality. There is no acute abnormality of the thoracic aorta. Moderately dilated esophagus is noted filled with food or debris. Status post thyroidectomy. LYMPH NODES: No mediastinal, hilar or axillary lymphadenopathy. LUNGS AND PLEURA: Minimal left posterior basilar atelectasis. Stable bilateral pulmonary nodules are noted compared to prior exam. Moderate-sized multiloculated right pleural effusion is noted. Small right basilar pneumothorax may be present. UPPER ABDOMEN: Limited images of the upper abdomen are unremarkable. SOFT TISSUES AND BONES: Status post surgical lateral fusion extending from T6 to T8. Status post surgical resection of portion of right sixth rib posteriorly. Extensive subcutaneous emphysema is seen in the right lateral chest wall and right supraclavicular region. IMPRESSION: 1. No evidence of pulmonary embolism. 2. Moderate-sized multiloculated right pleural effusion. 3. Small right basilar pneumothorax may be present. 4. Extensive subcutaneous emphysema in the right lateral chest wall and right supraclavicular region. 5.  Moderately dilated esophagus filled with food or debris. Electronically signed by: Lynwood Seip MD 04/28/2024 03:14 PM EDT RP Workstation: [id number]   DG Chest Portable 1 View Result Date: 04/28/2024 CLINICAL DATA:  Shortness of breath. Right chest pain since last night. Mid back surgery last week. EXAM: PORTABLE CHEST 1 VIEW COMPARISON:  04/25/2024, 04/16/2024 and 04/23/2024 FINDINGS: Right paravertebral fusion hardware over the midthoracic spine intact and unchanged. Partially visualized fusion hardware over the cervicothoracic junction. Surgical clips over the neck base unchanged. Lungs are hypoinflated with minimal patchy opacification bilaterally which may be due to infection. Minimal right base opacification likely small amount of pleural fluid with atelectasis. Tiny right apical pneumothorax unchanged from 04/16/2024. Cardiomediastinal silhouette is normal. Subcutaneous emphysema over the right neck base and right flank without significant change. IMPRESSION: 1. Hypoinflation with minimal patchy opacification bilaterally which may be due to infection. Minimal right base opacification likely small amount of pleural fluid with atelectasis. 2. Tiny right apical pneumothorax unchanged from 04/24/2024. 3. Stable subcutaneous emphysema over the right neck base and right flank. Electronically Signed   By: Toribio Agreste M.D.   On: 04/28/2024 12:49     Procedures   Medications Ordered in the ED  morphine  (PF) 4 MG/ML injection 4 mg (4 mg Intravenous Given 04/28/24 1306)  alum & mag hydroxide-simeth (MAALOX/MYLANTA) 200-200-20 MG/5ML suspension 30 mL (30 mLs Oral Given 04/28/24 1306)  iohexol (OMNIPAQUE) 350 MG/ML injection 75 mL (75 mLs Intravenous Contrast Given 04/28/24 1447)    Clinical Course as of 04/28/24 1620  Sat Apr 28, 2024  1326 Hemoglobin(!): 9.3 Stable 4d ago [JB]  1603 Spoke with Dr Gillie with neurosurgery, recommending patient is admitted to hospitalist team for further evaluation.   [JB]    Clinical Course User Index [JB] Cylan Borum, Warren SAILOR, PA-C                                 Medical Decision Making Amount and/or Complexity of Data Reviewed Labs: ordered. Decision-making details documented in ED Course. Radiology: ordered.  Risk OTC drugs. Prescription drug management. Decision regarding hospitalization.   This patient presents to the ED for concern of chest pain, this involves an extensive number of treatment options, and is a complaint that carries with it a high risk of complications and morbidity.  The  differential diagnosis includes ACS, stable angina, CHF, pneumonia, pneumothorax, aortic dissection, pulmonary embolus    Co morbidities that complicate the patient evaluation  Status post thoracic lumbar fusion procedure   Additional history obtained:  Additional history obtained from reviewed hospital stay with Dr. Malcolm 04/23/2024   Lab Tests:  I personally interpreted labs.  The pertinent results include:   No leukocytosis, stable hemoglobin.  CMP overall unremarkable.  Lipase unremarkable. Troponin 22, repeat 19    Imaging Studies ordered:  I ordered imaging studies including chest x-ray I independently visualized and interpreted imaging which showed chest x-ray Stable subcutaneous emphysema over right neck and right flank, stable tiny right apical pneumothorax, new mild right base opacification. CT angio chest No pulmonary embolism.  Show moderate-sized right pleural effusion and debris in esophagus. I agree with the radiologist interpretation   Cardiac Monitoring: / EKG:  The patient was maintained on a cardiac monitor.  I personally viewed and interpreted the cardiac monitored which showed an underlying rhythm of: normal sinus    Consultations Obtained:  I requested consultation with the neurosurgery Dr Gillie,  and discussed lab and imaging findings as well as pertinent plan - they recommend: Hospital admission for workup for  pleural effusion. Discussed with Dr. Jonel hospital team.   Problem List / ED Course / Critical interventions / Medication management  Patient reports status post thoracic spine fusion surgery with complaint of right sided chest pain radiating to right side of back.  She reports that this acutely worsened last night.  She reports that the pain is pleuritic in nature and causing her to feel short of breath.  She has not noticed any swelling in feet and ankles or calf cramping.  She has no history of DVT or PE.  She is hemodynamically stable and well-appearing.  She is not hypoxic.  Denies any abdominal pain nausea vomiting or fever. Patient's lab work shows no leukocytosis, essentially stable since prior recent labs.  Chest x-ray shows stable tiny pneumothorax.  I will obtain CT angio to rule out pulmonary embolism.  Given morphine  for pain control. Patient CT showing new moderate-sized right multiloculated pleural effusion.  I did discuss with neurosurgery as she is postop and they are recommending admission to hospital team for eval. I ordered medication including will trial morphine  for pain control Reevaluation of the patient after these medicines showed that the patient improved I have reviewed the patients home medicines and have made adjustments as needed      Final diagnoses:  Pleural effusion    ED Discharge Orders     None          Shermon Warren SAILOR, PA-C 04/28/24 1621    Towana Ozell BROCKS, MD 04/28/24 2032

## 2024-04-28 NOTE — Hospital Course (Addendum)
 58 y.o. F with obesity, significant weight loss, hypothyroidism and recent thoracic discectomy/fusion by Dr. Louis due to thoracic myelopathy, who presented with right sided chest pain and SOB.  CTA ruled out PE but showed loculated right pleural effusion and subcutaneous emphtysema, small right basilar pneumothorax.

## 2024-04-28 NOTE — Assessment & Plan Note (Signed)
 She has had a postop drop in her hemoglobin from baseline 12-9.  This is stable overnight. - Trend Hgb - Will attempt to obtain hematocrit from pleural fluid

## 2024-04-28 NOTE — Assessment & Plan Note (Signed)
-   Continue venlafaxine , lorazepam , bupropion , trazodone

## 2024-04-29 ENCOUNTER — Inpatient Hospital Stay (HOSPITAL_COMMUNITY)

## 2024-04-29 ENCOUNTER — Encounter (HOSPITAL_COMMUNITY): Payer: Self-pay | Admitting: Family Medicine

## 2024-04-29 DIAGNOSIS — T8182XA Emphysema (subcutaneous) resulting from a procedure, initial encounter: Secondary | ICD-10-CM

## 2024-04-29 DIAGNOSIS — Z9884 Bariatric surgery status: Secondary | ICD-10-CM

## 2024-04-29 DIAGNOSIS — J90 Pleural effusion, not elsewhere classified: Secondary | ICD-10-CM

## 2024-04-29 DIAGNOSIS — J95811 Postprocedural pneumothorax: Secondary | ICD-10-CM

## 2024-04-29 LAB — BODY FLUID CELL COUNT WITH DIFFERENTIAL
Eos, Fluid: 33 %
Lymphs, Fluid: 47 %
Monocyte-Macrophage-Serous Fluid: 3 % — ABNORMAL LOW (ref 50–90)
Neutrophil Count, Fluid: 12 % (ref 0–25)
Total Nucleated Cell Count, Fluid: 4828 uL — ABNORMAL HIGH (ref 0–1000)

## 2024-04-29 LAB — CBC
HCT: 30.2 % — ABNORMAL LOW (ref 36.0–46.0)
Hemoglobin: 9.6 g/dL — ABNORMAL LOW (ref 12.0–15.0)
MCH: 29 pg (ref 26.0–34.0)
MCHC: 31.8 g/dL (ref 30.0–36.0)
MCV: 91.2 fL (ref 80.0–100.0)
Platelets: 389 K/uL (ref 150–400)
RBC: 3.31 MIL/uL — ABNORMAL LOW (ref 3.87–5.11)
RDW: 13.5 % (ref 11.5–15.5)
WBC: 4.8 K/uL (ref 4.0–10.5)
nRBC: 0 % (ref 0.0–0.2)

## 2024-04-29 LAB — BASIC METABOLIC PANEL WITH GFR
Anion gap: 11 (ref 5–15)
BUN: 8 mg/dL (ref 6–20)
CO2: 27 mmol/L (ref 22–32)
Calcium: 8.1 mg/dL — ABNORMAL LOW (ref 8.9–10.3)
Chloride: 100 mmol/L (ref 98–111)
Creatinine, Ser: 0.55 mg/dL (ref 0.44–1.00)
GFR, Estimated: >60 mL/min (ref >60)
Glucose, Bld: 80 mg/dL (ref 70–99)
Potassium: 3.7 mmol/L (ref 3.5–5.1)
Sodium: 138 mmol/L (ref 135–145)

## 2024-04-29 LAB — LACTATE DEHYDROGENASE: LDH: 136 U/L (ref 98–192)

## 2024-04-29 LAB — LACTATE DEHYDROGENASE, PLEURAL OR PERITONEAL FLUID: LD, Fluid: 385 U/L — ABNORMAL HIGH (ref 3–23)

## 2024-04-29 MED ORDER — MIDAZOLAM HCL 2 MG/2ML IJ SOLN
INTRAMUSCULAR | Status: AC
  Filled 2024-04-29: qty 2

## 2024-04-29 MED ORDER — LIDOCAINE 1 % OPTIME INJ - NO CHARGE
10.0000 mL | Freq: Once | INTRAMUSCULAR | Status: DC
Start: 2024-04-29 — End: 2024-05-03
  Filled 2024-04-29: qty 10

## 2024-04-29 MED ORDER — FENTANYL CITRATE (PF) 100 MCG/2ML IJ SOLN
INTRAMUSCULAR | Status: AC
  Filled 2024-04-29: qty 2

## 2024-04-29 MED ORDER — FENTANYL CITRATE (PF) 100 MCG/2ML IJ SOLN
INTRAMUSCULAR | Status: AC | PRN
  Administered 2024-04-29: 25 ug via INTRAVENOUS
  Administered 2024-04-29: 50 ug via INTRAVENOUS
  Administered 2024-04-29: 25 ug via INTRAVENOUS

## 2024-04-29 MED ORDER — LIDOCAINE HCL 1 % IJ SOLN
10.0000 mL | Freq: Once | INTRAMUSCULAR | Status: DC
Start: 2024-04-29 — End: 2024-05-03
  Filled 2024-04-29: qty 10

## 2024-04-29 MED ORDER — MIDAZOLAM HCL (PF) 2 MG/2ML IJ SOLN
INTRAMUSCULAR | Status: AC | PRN
Start: 2024-04-29 — End: 2024-04-29
  Administered 2024-04-29: .5 mg via INTRAVENOUS
  Administered 2024-04-29: 1 mg via INTRAVENOUS
  Administered 2024-04-29: .5 mg via INTRAVENOUS

## 2024-04-29 NOTE — Progress Notes (Signed)
 Patient is currently being transferred by bed to CT. Fredie LITTIE Morones, RN

## 2024-04-29 NOTE — Progress Notes (Signed)
 Patient has returned to the unit from IR. Patient presents with right 14Fr chest tube placed. Area unremarkable. Tube connected to low suction. Chest tube currently with 450cc of sanguinous drainage upon arrival.VS WNL. Patient alert and answers appropriately. Husband at bedside.  Fredie LITTIE Morones, RN

## 2024-04-29 NOTE — Consult Note (Signed)
 Chief Complaint: Patient was seen in consultation today for right loculated pleural effusion, basilar PTX, sq emphysema Chief Complaint  Patient presents with   Shortness of Breath   Chest Pain   at the request of Danford, Christopher/ Barrett, Erin PA-C ([facility])  Referring Physician(s): Danford, Christopher/ Barrett, Erin PA-C ([facility])  Supervising Physician: Philip Cornet  Patient Status: [facility] - In-pt  History of Present Illness: Tammy Wilson is a 58 y.o. female with PMHs of hypothyroidism and recent thoracic discectomy/fusion by Dr. Louis on 04/23/24  due to thoracic myelopathy, presented with right sided chest pain and SOB, found to have right loculated pleural effuion, basilar PTX, sq emphysema, IR was consulted for chest tube placement.   Patient came to ED on 11/1 with SOB and right sided CP, CTA chest was negative for PE but showed loculated right pleural effusion, subcutaneous emphtysema, small right basilar pneumothorax. Patient recently underwent right sided transthoracic microdiscectomy at T6-7 and T7-8 on 04/23/24. Patient had a moderate sized pneumothorax post procedure as well as sub cutaneous emphysema at that time. She was not treated with chest tube during her stay. Cardiothoracic was consulted who recommended IR consult for chest tube placement.  Case reviewed and approved by Dr. Philip, patient may require multiple chest tubes.   Patient sitting on the side of the bed, not in acute distress. Husband at bedside.  Reports chest pain with deep breath.  Denise headache, fever, chills, shortness of breath, cough,  abdominal pain, nausea ,vomiting, and bleeding.   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   bladder tack     GASTRIC ROUX-EN-Y N/A 09/08/2020   Procedure: LAPAROSCOPIC ROUX-EN-Y GASTRIC BYPASS WITH UPPER ENDOSCOPY, CONVERSION FROM LAPAROSCOPIC SLEEVE GASTECTOMY;  Surgeon: Gladis Cough, MD;  Location: [facility];  Service: General;  Laterality: N/A;  3.5 HOURS TOTAL PLEASE   HIATAL HERNIA REPAIR N/A 09/08/2020   Procedure: HERNIA REPAIR HIATAL;  Surgeon: Gladis Cough, MD;  Location: [facility];  Service: General;  Laterality: N/A;   LAPAROSCOPIC GASTRIC SLEEVE RESECTION N/A 06/09/2015   Procedure: LAPAROSCOPIC GASTRIC SLEEVE RESECTION;  Surgeon: Cough Gladis, MD;  Location: [facility];  Service: General;  Laterality: N/A;   NECK SURGERY     Cervial fusion '12-[facility] - Dr. Louis   SINUSOTOMY     THYROIDECTOMY  2019   TUBAL LIGATION     UPPER GI ENDOSCOPY N/A 06/09/2015   Procedure: UPPER GI ENDOSCOPY;  Surgeon: Cough Gladis, MD;  Location: [facility];  Service: General;  Laterality: N/A;   UPPER GI ENDOSCOPY N/A 09/08/2020   Procedure: UPPER GI ENDOSCOPY;  Surgeon: Gladis Cough, MD;  Location: [facility];  Service: General;  Laterality: N/A;    Allergies: Hydromorphone, Dilaudid [hydromorphone hcl], and Sulfa antibiotics  Medications: Prior to Admission medications   Medication Sig Start Date End Date Taking? Authorizing  Provider  buPROPion  (WELLBUTRIN  XL) 300 MG 24 hr tablet Take 300 mg by mouth daily. 08/06/20   [provider]  chlorhexidine  (HIBICLENS ) 4 % external liquid Apply 15 mLs (1 Application total) topically as directed for 30 doses. Use as directed daily for 5 days every other week for 6 weeks. 04/23/24   Louis Shove, MD  Cyanocobalamin  (VITAMIN  B-12 PO) Take 1 tablet by mouth daily.    [provider]  FIBER PO Take 4-5 Doses by mouth at bedtime.    [provider]  gabapentin  (NEURONTIN ) 300 MG capsule Take 1 capsule (300 mg total) by mouth 3 (three) times daily. 04/26/24   Louis Shove, MD  levothyroxine  (SYNTHROID ) 100 MCG tablet Take 100 mcg by mouth at bedtime. 04/05/24   [provider]  LORazepam  (ATIVAN ) 0.5 MG tablet Take 0.5 mg by mouth every 8 (eight) hours as needed for anxiety. 09/09/21   [provider]  methocarbamol  (ROBAXIN ) 500 MG tablet Take 1 tablet (500 mg total) by mouth every 6 (six) hours as needed for muscle spasms. 04/26/24   Louis Shove, MD  mupirocin ointment (BACTROBAN) 2 % Place 1 Application into the nose 2 (two) times daily for 60 doses. Use as directed 2 times daily for 5 days every other week for 6 weeks. 04/23/24 05/23/24  Louis Shove, MD  oxyCODONE  10 MG TABS Take 1 tablet (10 mg total) by mouth every 3 (three) hours as needed for severe pain (pain score 7-10). 04/26/24   Louis Shove, MD  pantoprazole  (PROTONIX ) 40 MG tablet Take 1 tablet (40 mg total) by mouth 2 (two) times daily before a meal. 02/17/24 05/17/24  [place], PA-C  senna-docusate (SENOKOT-S) 8.6-50 MG tablet Take 2 tablets by mouth 2 (two) times daily.    [provider]  tirzepatide CLOYDE) 5 MG/0.5ML Pen Inject 5 mg into the skin once a week. 05/12/22   [provider]  traZODone  (DESYREL ) 50 MG tablet Take 50 mg by mouth at bedtime as needed for sleep.    [provider]  venlafaxine  XR (EFFEXOR -XR) 150 MG 24 hr capsule Take 150 mg by mouth daily. with food 08/06/20   [provider]  venlafaxine  XR (EFFEXOR -XR) 75 MG 24 hr capsule Take 75 mg by mouth daily. 04/12/24   [provider]     Family History  Problem Relation Age of Onset   Heart disease Mother    Rheum arthritis Mother    Colon cancer Neg Hx    Esophageal cancer Neg Hx    Stomach cancer  Neg Hx    Rectal cancer Neg Hx     Social History   Socioeconomic History   Marital status: Married    Spouse name: Not on file   Number of children: Not on file   Years of education: Not on file   Highest education level: Not on file  Occupational History   Not on file  Tobacco Use   Smoking status: Never   Smokeless tobacco: Never  Vaping Use   Vaping status: Never Used  Substance and Sexual Activity   Alcohol use: Not Currently    Comment: very rare   Drug use: No   Sexual activity: Not Currently  Other Topics Concern   Not on file  Social History Narrative   Not on file   Social Drivers of Health   Financial Resource Strain: Not on file  Food Insecurity: No Food Insecurity (04/28/2024)   Hunger Vital Sign  Worried About Programme Researcher, Broadcasting/film/video in the Last Year: Never true    Ran Out of Food in the Last Year: Never true  Transportation Needs: No Transportation Needs (04/28/2024)   PRAPARE - Administrator, Civil Service (Medical): No    Lack of Transportation (Non-Medical): No  Physical Activity: Not on file  Stress: Not on file  Social Connections: Not on file     Review of Systems: A 12 point ROS discussed and pertinent positives are indicated in the HPI above.  All other systems are negative.  Vital Signs: BP 119/79 (BP Location: Right Arm)   Pulse 73   Temp 98 F (36.7 C) (Oral)   Resp 15   Ht 5' 7 (1.702 m)   Wt 134 lb 14.7 oz (61.2 kg)   SpO2 95%   BMI 21.13 kg/m    Physical Exam Vitals and nursing note reviewed.  Constitutional:      General: Patient is not in acute distress.    Appearance: Normal appearance. Patient is not ill-appearing.  HENT:     Head: Normocephalic and atraumatic.     Mouth/Throat:     Mouth: Mucous membranes are moist.     Pharynx: Oropharynx is clear.  Cardiovascular:     Rate and Rhythm: Normal rate and regular rhythm.     Pulses: Normal pulses.     Heart sounds: Normal heart sounds.  Pulmonary:      Effort: Pulmonary effort is normal.     Breath sounds: Normal breath sounds.  Abdominal:     General: Abdomen is flat. Bowel sounds are normal.     Palpations: Abdomen is soft.  Musculoskeletal:     Cervical back: Neck supple.  Skin:    General: Skin is warm and dry.     Coloration: Skin is not jaundiced or pale.  Neurological:     Mental Status: Patient is alert and oriented to person, place, and time.  Psychiatric:        Mood and Affect: Mood normal.        Behavior: Behavior normal.        Judgment: Judgment normal.    MD Evaluation Airway: WNL Heart: WNL Abdomen: WNL Chest/ Lungs: WNL ASA  Classification: 3 Mallampati/Airway Score: One  Imaging: CT Angio Chest PE W and/or Wo Contrast Result Date: 04/28/2024 EXAM: CTA of the Chest with contrast for PE 04/28/2024 02:46:41 PM TECHNIQUE: CTA of the chest was performed without and with the administration of 75 mL of iohexol (OMNIPAQUE) 350 MG/ML injection. Multiplanar reformatted images are provided for review. MIP images are provided for review. Automated exposure control, iterative reconstruction, and/or weight based adjustment of the mA/kV was utilized to reduce the radiation dose to as low as reasonably achievable. COMPARISON: 05/30/2023 CLINICAL HISTORY: Pulmonary embolism (PE) suspected, high prob. FINDINGS: PULMONARY ARTERIES: Pulmonary arteries are adequately opacified for evaluation. No pulmonary embolism. Main pulmonary artery is normal in caliber. MEDIASTINUM: The heart and pericardium demonstrate no acute abnormality. There is no acute abnormality of the thoracic aorta. Moderately dilated esophagus is noted filled with food or debris. Status post thyroidectomy. LYMPH NODES: No mediastinal, hilar or axillary lymphadenopathy. LUNGS AND PLEURA: Minimal left posterior basilar atelectasis. Stable bilateral pulmonary nodules are noted compared to prior exam. Moderate-sized multiloculated right pleural effusion is noted. Small right  basilar pneumothorax may be present. UPPER ABDOMEN: Limited images of the upper abdomen are unremarkable. SOFT TISSUES AND BONES: Status post surgical lateral fusion extending from T6 to  T8. Status post surgical resection of portion of right sixth rib posteriorly. Extensive subcutaneous emphysema is seen in the right lateral chest wall and right supraclavicular region. IMPRESSION: 1. No evidence of pulmonary embolism. 2. Moderate-sized multiloculated right pleural effusion. 3. Small right basilar pneumothorax may be present. 4. Extensive subcutaneous emphysema in the right lateral chest wall and right supraclavicular region. 5. Moderately dilated esophagus filled with food or debris. Electronically signed by: Lynwood Seip MD 04/28/2024 03:14 PM EDT RP Workstation: [id number]   DG Chest Portable 1 View Result Date: 04/28/2024 CLINICAL DATA:  Shortness of breath. Right chest pain since last night. Mid back surgery last week. EXAM: PORTABLE CHEST 1 VIEW COMPARISON:  04/25/2024, 04/16/2024 and 04/23/2024 FINDINGS: Right paravertebral fusion hardware over the midthoracic spine intact and unchanged. Partially visualized fusion hardware over the cervicothoracic junction. Surgical clips over the neck base unchanged. Lungs are hypoinflated with minimal patchy opacification bilaterally which may be due to infection. Minimal right base opacification likely small amount of pleural fluid with atelectasis. Tiny right apical pneumothorax unchanged from 04/16/2024. Cardiomediastinal silhouette is normal. Subcutaneous emphysema over the right neck base and right flank without significant change. IMPRESSION: 1. Hypoinflation with minimal patchy opacification bilaterally which may be due to infection. Minimal right base opacification likely small amount of pleural fluid with atelectasis. 2. Tiny right apical pneumothorax unchanged from 04/24/2024. 3. Stable subcutaneous emphysema over the right neck base and right flank.  Electronically Signed   By: Toribio Agreste M.D.   On: 04/28/2024 12:49   DG CHEST PORT 1 VIEW Result Date: 04/25/2024 CLINICAL DATA:  Follow-up right pneumothorax. EXAM: PORTABLE CHEST 1 VIEW COMPARISON:  04/24/2024 FINDINGS: The previously demonstrated tiny right apical pneumothorax is no longer visualized. No significant change in extensive subcutaneous emphysema on the right. Mildly increased right basilar atelectasis. Clear left lung. Normal-sized heart. Thoracic and cervical spine fixation hardware. Bilateral thoracic inlet surgical clips. IMPRESSION: 1. The previously demonstrated tiny right apical pneumothorax is no longer visualized. 2. Mildly increased right basilar atelectasis. 3. Stable extensive right subcutaneous emphysema. Electronically Signed   By: Elspeth Bathe M.D.   On: 04/25/2024 11:54   DG CHEST PORT 1 VIEW Result Date: 04/24/2024 EXAM: 1 VIEW(S) XRAY OF THE CHEST 04/24/2024 05:58:10 AM COMPARISON: 04/23/2024 CLINICAL HISTORY: Pneumothorax, post-op thoracic surgery; rover. FINDINGS: LUNGS AND PLEURA: Significant decrease in right lower lung airspace opacity with mild residual subsegmental atelectasis seen. Tiny right apical and basilar pneumothorax, significantly decreased in size since prior exam. No pulmonary edema. No pleural effusion. HEART AND MEDIASTINUM: No acute abnormality of the cardiac and mediastinal silhouettes. BONES AND SOFT TISSUES: Cervical spine ACDF noted. Prior right thoracotomy defect again noted. Thoracic spine fusion hardware again noted. Persistent subcutaneous emphysema of right chest wall. No acute osseous abnormality. IMPRESSION: 1. Tiny right apical and basilar pneumothorax, significantly decreased in size since the prior exam. 2. Significant decrease in right lower lung airspace opacity with mild residual subsegmental atelectasis. 3. Persistent subcutaneous emphysema throughout the right chest wall. Electronically signed by: Norleen Kil MD 04/24/2024 06:39 AM  EDT RP Workstation: [id number]   DG CHEST PORT 1 VIEW Addendum Date: 04/23/2024 ADDENDUM REPORT: 04/23/2024 19:35 ADDENDUM: Critical Value/emergent results were called by telephone at the time of interpretation on 04/23/2024 at 7:35 pm to provider [facility] , who verbally acknowledged these results. Electronically Signed   By: Andrea Gasman M.D.   On: 04/23/2024 19:35   Result Date: 04/23/2024 CLINICAL DATA:  Pneumothorax. EXAM: PORTABLE CHEST 1 VIEW COMPARISON:  None Available. FINDINGS: Moderate to large right pneumothorax measures 4 cm from the lung apex. There is likely leftward mediastinal shift. Collapsed right infrahilar lung. Subcutaneous emphysema in the right chest wall. Thoracic spinal fusion hardware. The heart is normal in size. IMPRESSION: 1. Moderate to large right pneumothorax with likely leftward mediastinal shift. 2. Subcutaneous emphysema in the right chest wall. Electronically Signed: By: Andrea Gasman M.D. On: 04/23/2024 19:21   DG Thoracic Spine 2 View Result Date: 04/23/2024 CLINICAL DATA:  Elective surgery. EXAM: THORACIC SPINE 2 VIEWS COMPARISON:  None Available. FINDINGS: Two fluoroscopic spot views of the thoracic spine submitted from the operating room. Lateral plate and screw fixation in the midthoracic spine with 2 level interbody spacers. Fluoroscopy time 2 minutes 7 seconds. Dose 17.32 mGy. IMPRESSION: Intraoperative fluoroscopy during thoracic spine surgery. Electronically Signed   By: Andrea Gasman M.D.   On: 04/23/2024 19:22   DG C-Arm 1-60 Min-No Report Result Date: 04/23/2024 Fluoroscopy was utilized by the requesting physician.  No radiographic interpretation.   DG C-Arm 1-60 Min-No Report Result Date: 04/23/2024 Fluoroscopy was utilized by the requesting physician.  No radiographic interpretation.   DG C-Arm 1-60 Min-No Report Result Date: 04/23/2024 Fluoroscopy was utilized by the requesting physician.  No radiographic interpretation.     Labs:  CBC: Recent Labs    04/20/24 1100 04/24/24 0424 04/28/24 1221 04/29/24 0358  WBC 4.7 7.9 5.6 4.8  HGB 12.2 9.7* 9.3* 9.6*  HCT 37.9 29.4* 29.6* 30.2*  PLT 292 236 346 389    COAGS: No results for input(s): INR, APTT in the last 8760 hours.  BMP: Recent Labs    04/20/24 1100 04/24/24 0424 04/28/24 1221 04/29/24 0358  NA 139 138 139 138  K 3.9 3.8 4.0 3.7  CL 103 107 102 100  CO2 25 26 27 27   GLUCOSE 81 98 95 80  BUN 15 12 9 8   CALCIUM 8.7* 7.9* 8.1* 8.1*  CREATININE 0.70 0.64 0.57 0.55  GFRNONAA >60 >60 >60 >60    LIVER FUNCTION TESTS: Recent Labs    10/02/23 0440 10/03/23 0349 02/17/24 0912 04/28/24 1221  BILITOT 1.0 0.4 0.3 0.4  AST 19 41 34 21  ALT 13 22 20 14   ALKPHOS 47 56 62 48  PROT 7.1 6.2* 7.3 6.2*  ALBUMIN  3.4* 2.8* 4.0 2.5*    TUMOR MARKERS: No results for input(s): AFPTM, CEA, CA199, CHROMGRNA in the last 8760 hours.  Assessment and Plan: 58 y.o. female with recent right sided transthoracic microdiscectomy at T6-7 and T7-8 on 04/23/24 presents with right loculated pleural effusion, basilar PTX, sq emphysema, IR was consulted for chest tube placement.   VSS CBC stable, no leukocytosis  Not on AC/AP  Risks and benefits of chest tube placement were discussed with the patient including bleeding, infection, damage to adjacent structures, malfunction of the tube requiring additional procedures and sepsis.  All of the patient's questions were answered, patient is agreeable to proceed. Consent signed and in chart.   Thank you for this interesting consult.  I greatly enjoyed meeting Tammy Wilson and look forward to participating in their care.  A copy of this report was sent to the requesting provider on this date.  Electronically Signed: Toya VEAR Cousin, PA-C 04/29/2024, 12:41 PM   I spent a total of 40 Minutes    in face to face in clinical consultation, greater than 50% of which was counseling/coordinating care  for right chest tube placement.   This chart was dictated  using voice recognition software.  Despite best efforts to proofread,  errors can occur which can change the documentation meaning.

## 2024-04-29 NOTE — Progress Notes (Signed)
  Progress Note   Patient: Tammy Wilson FMW:[id number] DOB: 04-20-1966 DOA: 04/28/2024     1 DOS: the patient was seen and examined on 04/29/2024        Brief hospital course: 58 y.o. F with obesity, significant weight loss, hypothyroidism and recent thoracic discectomy/fusion by Dr. Louis due to thoracic myelopathy, who presented with right sided chest pain and SOB.  CTA ruled out PE but showed loculated right pleural effusion and subcutaneous emphtysema, small right basilar pneumothorax.          Assessment and Plan: * Pleural effusion on right Hemothorax vs infection -IR have been consulted for chest tube - Will need to send for culture, LDH, protein, hematocrit    Acute anemia She has had a postop drop in her hemoglobin from baseline 12-9.  This is stable overnight. - Trend Hgb - Will attempt to obtain hematocrit from pleural fluid  Thoracic myelopathy S/p recent T6-8 transthoracic discectomy, fusion, right rib harvest for autograft by Dr. Louis on 10/27 Discussed with Dr. Gillie, chest tube okay from post-surgical standpoint  Moderate major depression (HCC) - Continue venlafaxine , lorazepam , bupropion , trazodone   S/P gastric bypass History of obesity   Postoperative hypothyroidism Recent TSH is low, endocrinology reduced her dose - Continue levothyroxine  100 mcg daily          Subjective: Patient is having sharp pains in her chest overnight, no increase in oxygen requirements, no increase in shortness of breath.  No fever, no confusion.  Radiology were consulted, hopefully later on today.      Physical Exam: BP 117/75 (BP Location: Left Arm)   Pulse 71   Temp 97.7 F (36.5 C) (Oral)   Resp 12   Ht 5' 7 (1.702 m)   Wt 61.2 kg   SpO2 95%   BMI 21.13 kg/m   Adult female, sitting up in bed, interactive and appropriate, doing a coloring puzzle RRR, no murmurs, no peripheral edema Respiratory effort seems normal, but breathing's are shallow  because due to pain.  She is diminished on the right, she has subcutaneous emphysema on the right, no bleeding from her thoracic surgical wound.  I think I hear some crackles on the left, but inspiration is very limited and so exam is limited Abdomen soft, no tenderness palpation Upper extremity strength and coordination normal, face symmetric, speech fluent, oriented x 4     Data Reviewed: Basic metabolic panel shows normal electrolytes and renal function CBC shows stable anemia Discussed with interventional radiology    Family Communication:     Disposition: Status is: Inpatient         Author: Lonni SHAUNNA Dalton, MD 04/29/2024 8:30 AM  For on call review [email or web address].

## 2024-04-29 NOTE — Procedures (Addendum)
 Interventional Radiology Procedure:   Indications: Post operative right hydropneumothorax  Procedure: CT and US  guided right chest tube placement  Findings: 14 Fr drain placed in right lower chest.  Pleural fluid in right lower chest looked simple by ultrasound.  Removed serosanguineous fluid.    Complications: None     EBL: Minimal   Plan:  Fluid sent for analysis.  Follow output.     Bazil Dhanani R. Philip, MD  Pager: [phone]

## 2024-04-29 NOTE — Consult Note (Addendum)
 [address]       [place] [address]             [phone]        [name] [facility] Medical Record #[id number] Date of Birth: 30-May-1966  Referring: No ref. provider found Primary Care: Jackolyn Darice BROCKS, FNP Primary Cardiologist:None  Chief Complaint:    Chief Complaint  Patient presents with   Shortness of Breath   Chest Pain   History of Present Illness:      Tammy Wilson is a 58 yo female with known history of Hypothyroidism, H/O Obesity with signficant weight loss due to Gastric Sleeve. She presented to the ED on 04/28/2024 with complaints of shortness of breath and right sided chest pain.  She underwent CTA of chest which ruled out PE but showed right sided pleural effusion, with small pneumothorax, and sub q emphysema.  We have been asked to see patient for possible surgical drainage.  Of note patient recently underwent spinal procedure with rib harvest for a thoracic fusion on the right by Dr. Louis on 10/27.  She had a moderate sized pneumothorax post procedure as well as sub cutaneous emphysema at that time.  She was not treated with chest tube during her stay, however a rubber catheter was placed during her procedure.  Currently the patient continues to have mostly pain along her right chest.  She states she has no pain from her spinal surgery.  She does not that her breathing is better if she is sitting up in chair.  Denies fevers, chills, sweats.  Current Activity/ Functional Status: Patient is independent with mobility/ambulation, transfers, ADL's, IADL's.    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   bladder tack     GASTRIC ROUX-EN-Y N/A 09/08/2020   Procedure: LAPAROSCOPIC ROUX-EN-Y GASTRIC BYPASS WITH UPPER ENDOSCOPY, CONVERSION FROM LAPAROSCOPIC SLEEVE GASTECTOMY;  Surgeon: Gladis Cough, MD;  Location: [facility];  Service: General;  Laterality: N/A;  3.5 HOURS TOTAL PLEASE   HIATAL HERNIA REPAIR N/A 09/08/2020   Procedure: HERNIA REPAIR HIATAL;  Surgeon: Gladis Cough, MD;  Location: [facility];  Service: General;  Laterality: N/A;   LAPAROSCOPIC GASTRIC SLEEVE RESECTION N/A 06/09/2015   Procedure: LAPAROSCOPIC GASTRIC SLEEVE RESECTION;  Surgeon: Cough Gladis, MD;  Location: [facility];  Service: General;  Laterality: N/A;   NECK SURGERY     Cervial fusion '12-[facility] - Dr. Louis   SINUSOTOMY     THYROIDECTOMY  2019   TUBAL LIGATION     UPPER GI ENDOSCOPY N/A 06/09/2015   Procedure: UPPER GI ENDOSCOPY;  Surgeon: Cough Gladis, MD;  Location: [facility];  Service: General;  Laterality: N/A;   UPPER GI ENDOSCOPY N/A 09/08/2020   Procedure: UPPER GI ENDOSCOPY;  Surgeon: Gladis Cough, MD;  Location: [facility];  Service:  General;  Laterality: N/A;    Social History   Tobacco Use  Smoking Status Never  Smokeless Tobacco Never    Social History   Substance and Sexual Activity  Alcohol Use Not Currently   Comment: very rare     Allergies  Allergen Reactions   Hydromorphone Other (See Comments)   Dilaudid [Hydromorphone Hcl] Palpitations   Sulfa Antibiotics Itching and Other (See Comments)    Possible rash    Current Facility-Administered Medications  Medication Dose Route Frequency Provider Last Rate Last Admin   acetaminophen  (TYLENOL ) tablet 650 mg  650 mg Oral Q6H PRN Danford, Lonni SQUIBB, MD       Or    acetaminophen  (TYLENOL ) suppository 650 mg  650 mg Rectal Q6H PRN Danford, Lonni SQUIBB, MD       alum & mag hydroxide-simeth (MAALOX/MYLANTA) 200-200-20 MG/5ML suspension 30 mL  30 mL Oral Q6H PRN Chavez, Abigail, NP   30 mL at 04/28/24 2059   azithromycin (ZITHROMAX) tablet 500 mg  500 mg Oral Daily Danford, Christopher P, MD   500 mg at 04/29/24 0813   buPROPion  (WELLBUTRIN  XL) 24 hr tablet 300 mg  300 mg Oral Daily Danford, Christopher P, MD   300 mg at 04/29/24 9188   cefTRIAXone  (ROCEPHIN ) 2 g in sodium chloride  0.9 % 100 mL IVPB  2 g Intravenous Q24H Jonel Lonni SQUIBB, MD   Stopped at 04/28/24 1824   gabapentin  (NEURONTIN ) capsule 300 mg  300 mg Oral TID Jonel Lonni SQUIBB, MD   300 mg at 04/29/24 9187   levothyroxine  (SYNTHROID ) tablet 100 mcg  100 mcg Oral Q0600 Jonel Lonni SQUIBB, MD   100 mcg at 04/29/24 9348   LORazepam  (ATIVAN ) tablet 0.5 mg  0.5 mg Oral Q8H PRN Danford, Lonni SQUIBB, MD       methocarbamol  (ROBAXIN ) tablet 500 mg  500 mg Oral Q6H PRN Jonel Lonni SQUIBB, MD   500 mg at 04/28/24 2059   morphine  (PF) 4 MG/ML injection 4 mg  4 mg Intravenous Q4H PRN Danford, Lonni SQUIBB, MD       ondansetron  (ZOFRAN ) tablet 4 mg  4 mg Oral Q6H PRN Danford, Lonni SQUIBB, MD       Or   ondansetron  (ZOFRAN ) injection 4 mg  4 mg Intravenous Q6H PRN Danford, Lonni SQUIBB, MD       oxyCODONE  (Oxy IR/ROXICODONE ) immediate release tablet 10 mg  10 mg Oral Q3H PRN Jonel Lonni SQUIBB, MD   10 mg at 04/29/24 0811   pantoprazole  (PROTONIX ) EC tablet 40 mg  40 mg Oral BID AC Danford, Lonni SQUIBB, MD   40 mg at 04/29/24 9187   senna-docusate (Senokot-S) tablet 2 tablet  2 tablet Oral BID Jonel Lonni SQUIBB, MD   2 tablet at 04/29/24 9187   traZODone  (DESYREL ) tablet 50 mg  50 mg Oral QHS PRN Jonel Lonni SQUIBB, MD   50 mg at 04/28/24 2059   venlafaxine  XR (EFFEXOR -XR) 24 hr capsule 150 mg  150 mg Oral Daily Danford, Christopher P, MD   150 mg at 04/29/24 9175    venlafaxine  XR (EFFEXOR -XR) 24 hr capsule 75 mg  75 mg Oral Daily Danford, Christopher P, MD   75 mg at 04/29/24 9173    Medications Prior to Admission  Medication Sig Dispense Refill Last Dose/Taking   buPROPion  (WELLBUTRIN  XL) 300 MG 24 hr tablet Take 300 mg by mouth daily.      chlorhexidine  (HIBICLENS ) 4 % external liquid Apply 15 mLs (1  Application total) topically as directed for 30 doses. Use as directed daily for 5 days every other week for 6 weeks. 946 mL 1    Cyanocobalamin  (VITAMIN B-12 PO) Take 1 tablet by mouth daily.      FIBER PO Take 4-5 Doses by mouth at bedtime.      gabapentin  (NEURONTIN ) 300 MG capsule Take 1 capsule (300 mg total) by mouth 3 (three) times daily. 90 capsule 0    levothyroxine  (SYNTHROID ) 100 MCG tablet Take 100 mcg by mouth at bedtime.      LORazepam  (ATIVAN ) 0.5 MG tablet Take 0.5 mg by mouth every 8 (eight) hours as needed for anxiety.      methocarbamol  (ROBAXIN ) 500 MG tablet Take 1 tablet (500 mg total) by mouth every 6 (six) hours as needed for muscle spasms. 40 tablet 1    mupirocin ointment (BACTROBAN) 2 % Place 1 Application into the nose 2 (two) times daily for 60 doses. Use as directed 2 times daily for 5 days every other week for 6 weeks. 60 g 0    oxyCODONE  10 MG TABS Take 1 tablet (10 mg total) by mouth every 3 (three) hours as needed for severe pain (pain score 7-10). 40 tablet 0    pantoprazole  (PROTONIX ) 40 MG tablet Take 1 tablet (40 mg total) by mouth 2 (two) times daily before a meal. 60 tablet 2    senna-docusate (SENOKOT-S) 8.6-50 MG tablet Take 2 tablets by mouth 2 (two) times daily.      tirzepatide (MOUNJARO) 5 MG/0.5ML Pen Inject 5 mg into the skin once a week.      traZODone  (DESYREL ) 50 MG tablet Take 50 mg by mouth at bedtime as needed for sleep.      venlafaxine  XR (EFFEXOR -XR) 150 MG 24 hr capsule Take 150 mg by mouth daily. with food      venlafaxine  XR (EFFEXOR -XR) 75 MG 24 hr capsule Take 75 mg by mouth daily.       Family  History  Problem Relation Age of Onset   Heart disease Mother    Rheum arthritis Mother    Colon cancer Neg Hx    Esophageal cancer Neg Hx    Stomach cancer Neg Hx    Rectal cancer Neg Hx      Review of Systems:   ROS     Cardiac Review of Systems: Y or  [    ]= no  Chest Pain [  Y, right sided  ]  Resting SOB [ Y  ] Exertional SOB  [Y  ]  Orthopnea [  ]   Pedal Edema [   ]    Palpitations [  ] Syncope  [  ]   Presyncope [   ]  General Review of Systems: [Y] = yes [  ]=no Constitional: recent weight change [Y, significant weight loss post bariatric surgery  ]; anorexia [  ]; fatigue [  ]; nausea [  ]; night sweats [ N ]; fever [  N]; or chills [  ]                                                               Dental: Last Dentist visit:   Eye : blurred vision [  ]; diplopia [   ];  vision changes [  ];  Amaurosis fugax[  ]; Resp: cough [ Y ];  wheezing[  ];  hemoptysis[  ]; shortness of breath[Y  ]; paroxysmal nocturnal dyspnea[  ]; dyspnea on exertion[  ]; or orthopnea[  ];  GI:  gallstones[  ], vomiting[N  ];  dysphagia[  ]; melena[  ];  hematochezia [  ]; heartburn[  ];   Hx of  Colonoscopy[  ]; GU: kidney stones [  ]; hematuria[  ];   dysuria [  ];  nocturia[  ];  history of     obstruction [  ]; urinary frequency [  ]             Skin: rash, swelling[  ];, hair loss[  ];  peripheral edema[  ];  or itching[  ]; Musculosketetal: myalgias[  ];  joint swelling[  ];  joint erythema[  ];  joint pain[  ];  back pain[  ];  Heme/Lymph: bruising[  ];  bleeding[  ];  anemia[  ];  Neuro: TIA[  ];  headaches[  ];  stroke[  ];  vertigo[  ];  seizures[  ];   paresthesias[  ];  difficulty walking[  ];  Psych:depression[  ]; anxiety[  ];  Endocrine: diabetes[N  ];  thyroid  dysfunction[ Y ]; Physical Exam: BP 117/75 (BP Location: Left Arm)   Pulse 71   Temp 97.7 F (36.5 C) (Oral)   Resp 12   Ht 5' 7 (1.702 m)   Wt 61.2 kg   SpO2 95%   BMI 21.13 kg/m   General appearance: alert,  cooperative, and no distress Resp: diminished breath sounds right base Cardio: regular rate and rhythm  Diagnostic Studies & Laboratory data:     Recent Radiology Findings:   CT Angio Chest PE W and/or Wo Contrast Result Date: 04/28/2024 EXAM: CTA of the Chest with contrast for PE 04/28/2024 02:46:41 PM TECHNIQUE: CTA of the chest was performed without and with the administration of 75 mL of iohexol (OMNIPAQUE) 350 MG/ML injection. Multiplanar reformatted images are provided for review. MIP images are provided for review. Automated exposure control, iterative reconstruction, and/or weight based adjustment of the mA/kV was utilized to reduce the radiation dose to as low as reasonably achievable. COMPARISON: 05/30/2023 CLINICAL HISTORY: Pulmonary embolism (PE) suspected, high prob. FINDINGS: PULMONARY ARTERIES: Pulmonary arteries are adequately opacified for evaluation. No pulmonary embolism. Main pulmonary artery is normal in caliber. MEDIASTINUM: The heart and pericardium demonstrate no acute abnormality. There is no acute abnormality of the thoracic aorta. Moderately dilated esophagus is noted filled with food or debris. Status post thyroidectomy. LYMPH NODES: No mediastinal, hilar or axillary lymphadenopathy. LUNGS AND PLEURA: Minimal left posterior basilar atelectasis. Stable bilateral pulmonary nodules are noted compared to prior exam. Moderate-sized multiloculated right pleural effusion is noted. Small right basilar pneumothorax may be present. UPPER ABDOMEN: Limited images of the upper abdomen are unremarkable. SOFT TISSUES AND BONES: Status post surgical lateral fusion extending from T6 to T8. Status post surgical resection of portion of right sixth rib posteriorly. Extensive subcutaneous emphysema is seen in the right lateral chest wall and right supraclavicular region. IMPRESSION: 1. No evidence of pulmonary embolism. 2. Moderate-sized multiloculated right pleural effusion. 3. Small right basilar  pneumothorax may be present. 4. Extensive subcutaneous emphysema in the right lateral chest wall and right supraclavicular region. 5. Moderately dilated esophagus filled with food or debris. Electronically signed by: Lynwood Seip MD 04/28/2024 03:14 PM EDT RP Workstation: [id number]   DG  Chest Portable 1 View Result Date: 04/28/2024 CLINICAL DATA:  Shortness of breath. Right chest pain since last night. Mid back surgery last week. EXAM: PORTABLE CHEST 1 VIEW COMPARISON:  04/25/2024, 04/16/2024 and 04/23/2024 FINDINGS: Right paravertebral fusion hardware over the midthoracic spine intact and unchanged. Partially visualized fusion hardware over the cervicothoracic junction. Surgical clips over the neck base unchanged. Lungs are hypoinflated with minimal patchy opacification bilaterally which may be due to infection. Minimal right base opacification likely small amount of pleural fluid with atelectasis. Tiny right apical pneumothorax unchanged from 04/16/2024. Cardiomediastinal silhouette is normal. Subcutaneous emphysema over the right neck base and right flank without significant change. IMPRESSION: 1. Hypoinflation with minimal patchy opacification bilaterally which may be due to infection. Minimal right base opacification likely small amount of pleural fluid with atelectasis. 2. Tiny right apical pneumothorax unchanged from 04/24/2024. 3. Stable subcutaneous emphysema over the right neck base and right flank. Electronically Signed   By: Toribio Agreste M.D.   On: 04/28/2024 12:49     I have independently reviewed the above radiologic studies and discussed with the patient   Recent Lab Findings: Lab Results  Component Value Date   WBC 4.8 04/29/2024   HGB 9.6 (L) 04/29/2024   HCT 30.2 (L) 04/29/2024   PLT 389 04/29/2024   GLUCOSE 80 04/29/2024   ALT 14 04/28/2024   AST 21 04/28/2024   NA 138 04/29/2024   K 3.7 04/29/2024   CL 100 04/29/2024   CREATININE 0.55 04/29/2024   BUN 8 04/29/2024   CO2 27  04/29/2024   TSH 0.70 02/17/2024    Assessment / Plan:    Right sided pleural effusion, loculated in some places- agree with IR Placement of chest tube Sub cutaneous emphysema- this is not new and has been present since her previous spinal surgery  Plan:  Agree with right sided chest tube placement by IR.SABRA There is no evidence that this fluid is infected, it very well may be some residual blood from her rib harvest.  She also had a decent size pneumothorax/sub cutaneous emphysema post spinal surgery. If IR chest tube is not successful in draining fluid collection, then she could require VATS procedure.  We will follow along   I  spent 30 minutes counseling the patient face to face.  Rocky Shad, PA-C 04/29/2024 10:24 AM   Chart reviewed, agree with above.  This is related to her recent spine surgery and is partially loculated, may include some hematoma from surgery. I think it is worth trying pigtail drainage by IR before considering VATS or open surgical drainage.

## 2024-04-29 NOTE — Plan of Care (Signed)

## 2024-04-30 ENCOUNTER — Inpatient Hospital Stay (HOSPITAL_COMMUNITY)

## 2024-04-30 DIAGNOSIS — R918 Other nonspecific abnormal finding of lung field: Secondary | ICD-10-CM | POA: Diagnosis not present

## 2024-04-30 DIAGNOSIS — Z978 Presence of other specified devices: Secondary | ICD-10-CM

## 2024-04-30 DIAGNOSIS — Z4682 Encounter for fitting and adjustment of non-vascular catheter: Secondary | ICD-10-CM | POA: Diagnosis not present

## 2024-04-30 DIAGNOSIS — J90 Pleural effusion, not elsewhere classified: Secondary | ICD-10-CM | POA: Diagnosis not present

## 2024-04-30 DIAGNOSIS — J939 Pneumothorax, unspecified: Secondary | ICD-10-CM | POA: Diagnosis not present

## 2024-04-30 LAB — CBC
HCT: 27.7 % — ABNORMAL LOW (ref 36.0–46.0)
Hemoglobin: 8.7 g/dL — ABNORMAL LOW (ref 12.0–15.0)
MCH: 28.8 pg (ref 26.0–34.0)
MCHC: 31.4 g/dL (ref 30.0–36.0)
MCV: 91.7 fL (ref 80.0–100.0)
Platelets: 364 K/uL (ref 150–400)
RBC: 3.02 MIL/uL — ABNORMAL LOW (ref 3.87–5.11)
RDW: 13.4 % (ref 11.5–15.5)
WBC: 5.2 K/uL (ref 4.0–10.5)
nRBC: 0 % (ref 0.0–0.2)

## 2024-04-30 LAB — COMPREHENSIVE METABOLIC PANEL WITH GFR
ALT: 12 U/L (ref 0–44)
AST: 16 U/L (ref 15–41)
Albumin: 2.2 g/dL — ABNORMAL LOW (ref 3.5–5.0)
Alkaline Phosphatase: 45 U/L (ref 38–126)
Anion gap: 9 (ref 5–15)
BUN: 10 mg/dL (ref 6–20)
CO2: 29 mmol/L (ref 22–32)
Calcium: 8.1 mg/dL — ABNORMAL LOW (ref 8.9–10.3)
Chloride: 102 mmol/L (ref 98–111)
Creatinine, Ser: 0.66 mg/dL (ref 0.44–1.00)
GFR, Estimated: >60 mL/min (ref >60)
Glucose, Bld: 84 mg/dL (ref 70–99)
Potassium: 3.8 mmol/L (ref 3.5–5.1)
Sodium: 140 mmol/L (ref 135–145)
Total Bilirubin: 0.3 mg/dL (ref 0.0–1.2)
Total Protein: 5.5 g/dL — ABNORMAL LOW (ref 6.5–8.1)

## 2024-04-30 LAB — PATHOLOGIST SMEAR REVIEW

## 2024-04-30 MED ORDER — POLYETHYLENE GLYCOL 3350 17 G PO PACK
17.0000 g | PACK | Freq: Every day | ORAL | Status: DC
  Administered 2024-04-30 – 2024-05-02 (×3): 17 g via ORAL
  Filled 2024-04-30 (×4): qty 1

## 2024-04-30 MED ORDER — SODIUM CHLORIDE 0.9 % IV BOLUS
500.0000 mL | Freq: Once | INTRAVENOUS | Status: AC
  Administered 2024-04-30: 500 mL via INTRAVENOUS

## 2024-04-30 NOTE — Plan of Care (Signed)
  Problem: Health Behavior/Discharge Planning: Goal: Ability to manage health-related needs will improve Outcome: Progressing   Problem: Clinical Measurements: Goal: Diagnostic test results will improve Outcome: Progressing Goal: Respiratory complications will improve Outcome: Progressing   Problem: Activity: Goal: Risk for activity intolerance will decrease Outcome: Progressing   

## 2024-04-30 NOTE — Plan of Care (Signed)

## 2024-04-30 NOTE — Progress Notes (Addendum)
      [address]       [place] [address]             [phone]           Subjective: Patient sleeping and briefly awakened.   Objective: Vital signs in last 24 hours: Temp:  [97.5 F (36.4 C)-98.3 F (36.8 C)] 98.3 F (36.8 C) (11/03 0400) Pulse Rate:  [62-80] 73 (11/03 0400) Cardiac Rhythm: Normal sinus rhythm (11/02 1857) Resp:  [8-17] 16 (11/03 0400) BP: (92-121)/(52-83) 110/63 (11/03 0400) SpO2:  [92 %-100 %] 93 % (11/03 0400)     Intake/Output from previous day: 11/02 0701 - 11/03 0700 In: -  Out: 216.5 [Chest Tube:216.5]   Physical Exam:  Cardiovascular: RRR Pulmonary: Clear to auscultation on left and slightly diminished right basilar breath sounds Wounds: Dressing is clean and dry.   Chest Tube: to water  seal, light yellow drainage  Lab Results: CBC: Recent Labs    04/29/24 0358 04/30/24 0604  WBC 4.8 5.2  HGB 9.6* 8.7*  HCT 30.2* 27.7*  PLT 389 364   BMET:  Recent Labs    04/29/24 0358 04/30/24 0604  NA 138 140  K 3.7 3.8  CL 100 102  CO2 27 29  GLUCOSE 80 84  BUN 8 10  CREATININE 0.55 0.66  CALCIUM 8.1* 8.1*    PT/INR: No results for input(s): LABPROT, INR in the last 72 hours. ABG:  INR: Will add last result for INR, ABG once components are confirmed Will add last 4 CBG results once components are confirmed  Assessment/Plan:  1. CV - SR 2.  Pulmonary - Chest tube with 216 cc recorded but 650 cc in pleura vac. No CXR ordered for this am so will order. Check CXR daily while chest tube remains. Hope to avoid right VATS;will continue to follow   Donielle M ZimmermanPA-C 04/30/2024,7:21 AM  Agree with above. CXR looks very good after pigtail drainage. Keep tube in until drainage minimal.

## 2024-04-30 NOTE — Progress Notes (Signed)
  Progress Note   Patient: Tammy Wilson FMW:[id number] DOB: 04-Sep-1965 DOA: 04/28/2024     2 DOS: the patient was seen and examined on 04/30/2024 at 8:20AM      Brief hospital course: 58 y.o. F with obesity, significant weight loss, hypothyroidism and recent thoracic discectomy/fusion by Dr. Louis due to thoracic myelopathy, who presented with right sided chest pain and SOB.  CTA ruled out PE but showed loculated right pleural effusion and subcutaneous emphtysema, small right basilar pneumothorax.          Assessment and Plan: * Pleural effusion on right Fluid exudative, serous.  Mostly WBC. Smear shows blood and leukocytes. CXR this morning shows less RL effusion, improvement in PTX - Consult CT surgery for CT mgmt on suction currently - Follow fluid culture for completeness but no suspicion clinically for infection at present    Acute anemia She has had a postop drop in her hemoglobin from baseline 12 --> 9 post op.  Fluid actually serous not bloody, cell count mostly WBCs.   Hgb slightly lower today. - Trend Hgb - Follow smear  Thoracic myelopathy S/p recent T6-8 transthoracic discectomy, fusion, right rib harvest for autograft by Dr. Louis on 10/27 Discussed with Dr. Gillie, chest tube okay from post-surgical standpoint  Moderate major depression (HCC) - Continue venlafaxine , lorazepam , bupropion , trazodone   S/P gastric bypass History of obesity   Postoperative hypothyroidism Recent TSH is low, endocrinology reduced her dose - Continue levothyroxine  100 mcg daily          Subjective: The patient has a fair amount of pain, she has no fever, confusion.     Physical Exam: BP 114/61 (BP Location: Right Arm)   Pulse 71   Temp 98.7 F (37.1 C) (Oral)   Resp 14   Ht 5' 7 (1.702 m)   Wt 61.2 kg   SpO2 91%   BMI 21.13 kg/m   Adult female, lying in bed, interactive and appropriate RRR, no murmurs, no peripheral edema Respiratory effort seems shallow,  but rate normal, painful with breathing, overall diminished, actually fairly good lung sounds on the right, no crackles Abdomen soft, no tenderness palpation or guarding Attention normal, affect blunted by pain, face symmetric, keeps eyes closed, seems to have symmetric movement of upper extremities, face symmetric   Data Reviewed: Discussed with CT surgery Basic metabolic panel unremarkable Chest x-ray, personally reviewed, shows improvement in effusion, no pneumothorax Blood count shows hemoglobin trending down to 8.7    Family Communication: Husband at the bedside    Disposition: Status is: Inpatient         Author: Lonni SHAUNNA Dalton, MD 04/30/2024 3:18 PM  For on call review [email or web address].

## 2024-05-01 ENCOUNTER — Inpatient Hospital Stay (HOSPITAL_COMMUNITY)

## 2024-05-01 DIAGNOSIS — J90 Pleural effusion, not elsewhere classified: Secondary | ICD-10-CM | POA: Diagnosis not present

## 2024-05-01 LAB — CBC
HCT: 28.8 % — ABNORMAL LOW (ref 36.0–46.0)
Hemoglobin: 9.1 g/dL — ABNORMAL LOW (ref 12.0–15.0)
MCH: 29.4 pg (ref 26.0–34.0)
MCHC: 31.6 g/dL (ref 30.0–36.0)
MCV: 92.9 fL (ref 80.0–100.0)
Platelets: 423 K/uL — ABNORMAL HIGH (ref 150–400)
RBC: 3.1 MIL/uL — ABNORMAL LOW (ref 3.87–5.11)
RDW: 13.3 % (ref 11.5–15.5)
WBC: 5.5 K/uL (ref 4.0–10.5)
nRBC: 0 % (ref 0.0–0.2)

## 2024-05-01 LAB — BASIC METABOLIC PANEL WITH GFR
Anion gap: 10 (ref 5–15)
BUN: 9 mg/dL (ref 6–20)
CO2: 29 mmol/L (ref 22–32)
Calcium: 8.3 mg/dL — ABNORMAL LOW (ref 8.9–10.3)
Chloride: 102 mmol/L (ref 98–111)
Creatinine, Ser: 0.68 mg/dL (ref 0.44–1.00)
GFR, Estimated: >60 mL/min (ref >60)
Glucose, Bld: 73 mg/dL (ref 70–99)
Potassium: 4 mmol/L (ref 3.5–5.1)
Sodium: 141 mmol/L (ref 135–145)

## 2024-05-01 MED ORDER — PNEUMOCOCCAL 20-VAL CONJ VACC 0.5 ML IM SUSY
0.5000 mL | PREFILLED_SYRINGE | INTRAMUSCULAR | Status: AC
  Administered 2024-05-02: 0.5 mL via INTRAMUSCULAR
  Filled 2024-05-01: qty 0.5

## 2024-05-01 MED ORDER — KETOROLAC TROMETHAMINE 15 MG/ML IJ SOLN
15.0000 mg | Freq: Four times a day (QID) | INTRAMUSCULAR | Status: AC
  Administered 2024-05-01 – 2024-05-02 (×4): 15 mg via INTRAVENOUS
  Filled 2024-05-01 (×4): qty 1

## 2024-05-01 NOTE — Progress Notes (Signed)
  Progress Note   Patient: Tammy Wilson FMW:[id number] DOB: 06/27/1966 DOA: 04/28/2024     3 DOS: the patient was seen and examined on 05/01/2024 at 8:36AM      Brief hospital course: 58 y.o. F with obesity, significant weight loss, hypothyroidism and recent thoracic discectomy/fusion by Dr. Louis due to thoracic myelopathy, who presented with right sided chest pain and SOB.  CTA ruled out PE but showed loculated right pleural effusion and subcutaneous emphtysema, small right basilar pneumothorax.          Assessment and Plan: * Pleural effusion on right 200cc out yesterday.  Culture no growth.    No fever, no leukcytosis, my impression of CT imaging was that there were parenchymal opacities but official report calls this atelectasis, nothing new.  CXR shows minimal PTX, no new change. - Stop Abx - Chest tube to suction    Acute anemia Mild, mostly stable - Trend Hgb   Thoracic myelopathy S/p recent T6-8 transthoracic discectomy, fusion, right rib harvest for autograft by Dr. Louis on 10/27  Moderate major depression (HCC) - Continue lorazepam , venlafaxine , trazodone , bupropion  S/P gastric bypass History of obesity   Postoperative hypothyroidism -Continue levothyroxine           Subjective: Patient is having a lot of pain, but within expected typical for chest tube placement.  No new dyspnea, no new hypoxia, no nursing concerns.     Physical Exam: BP 95/63   Pulse 64   Temp 98.2 F (36.8 C) (Oral)   Resp 15   Ht 5' 7 (1.702 m)   Wt 61.2 kg   SpO2 98%   BMI 21.13 kg/m   Adult female, sitting up in bed, interactive and appropriate RRR, no murmurs, no peripheral edema Respiratory rate seems normal, inspiration shallow, overall poor air movement, no rales or wheezes appreciated Abdomen soft, no tenderness palpation Attention normal, affect blunted by pain, face symmetric, speech fluent, upper extremities strength and coordination appears  normal    Data Reviewed: Discussed with cardiothoracic surgery team Basic metabolic panel shows normal electrolytes and renal function CBC shows stable anemia Chest x-ray, personally reviewed, shows resolved effusion, no significant new pneumothorax    Family Communication:     Disposition: Status is: Inpatient 58 yo F with recent thoracic approach spinal surgery, presented with pleural effusion  Chest tube seems to be working, coordinating with CT surgery regarding timing of removal        Author: Lonni SHAUNNA Dalton, MD 05/01/2024 4:43 PM  For on call review [email or web address].

## 2024-05-01 NOTE — Progress Notes (Signed)
 Sorry, I did not realize this patient had been readmitted until earlier today.  Patient readmitted with chest pain and worsening right pleural effusion.  Chest tube placed for drainage.  Cultures and cell count not consistent with infection.  Patient remains afebrile.  Chest tube output lessening.  CT imaging of her thoracic spine reveals good appearance for decompression and fusion without complicating features.  Currently complains of pain around her chest tube insertion site otherwise doing reasonably well.  No complaints of numbness paresthesias or weakness into her extremities.  Wound clean and dry.  Status post transthoracic two-level microdiscectomy with interbody fusion.  Postoperative course complicated by likely reactive pleural effusion.  Continue chest tube drainage and observation.  Appreciate medicine and cardiothoracic surgery management.

## 2024-05-01 NOTE — Progress Notes (Signed)
   05/01/24 1209  TOC Brief Assessment  Insurance and Status Reviewed  Patient has primary care physician Yes  Home environment has been reviewed home w/ spouse  Prior level of function: assisted post surgery  Prior/Current Home Services Current home services (pt was discharged on 10/30 - HH set up with [facility] for PT/OT- will need new orders on discharge if returns home)  Social Drivers of Health Review SDOH reviewed no interventions necessary  Readmission risk has been reviewed Yes  Transition of care needs transition of care needs identified, TOC will continue to follow     Pt discharged home on 10/30 w/ HH set up under [facility] for HHPT/OT, pt readmitted 11/1 for pl. Effusion and chest tube placement.  Inpatient Care Management (ICM) will continue to monitor patient advancement through interdisciplinary progression rounds. If new patient transition needs arise, please place a ICM (CM/CSW) consult. Pt will need new HH orders if plan remains to return home

## 2024-05-01 NOTE — Plan of Care (Signed)

## 2024-05-01 NOTE — Progress Notes (Addendum)
      [address]       [place] [address]             [phone]           Subjective: Patient sleeping and briefly awakened. She has pain at chest tube site.  Objective: Vital signs in last 24 hours: Temp:  [97.7 F (36.5 C)-98.7 F (37.1 C)] 97.7 F (36.5 C) (11/04 0301) Pulse Rate:  [63-79] 66 (11/04 0259) Cardiac Rhythm: Normal sinus rhythm (11/03 1906) Resp:  [10-17] 12 (11/04 0301) BP: (95-123)/(59-72) 104/59 (11/04 0301) SpO2:  [91 %-97 %] 96 % (11/04 0259)     Intake/Output from previous day: 11/03 0701 - 11/04 0700 In: 100 [IV Piggyback:100] Out: 200 [Chest Tube:200]   Physical Exam:  Cardiovascular: RRR Pulmonary: Clear to auscultation on left and slightly diminished right basilar breath sounds (is improving). Subcutaneous emphysema right lateral chest wall (has had) Wounds: Dressing is clean and dry.   Chest Tube: to water  seal, light yellow drainage  Lab Results: CBC: Recent Labs    04/30/24 0604 05/01/24 0320  WBC 5.2 5.5  HGB 8.7* 9.1*  HCT 27.7* 28.8*  PLT 364 423*   BMET:  Recent Labs    04/30/24 0604 05/01/24 0320  NA 140 141  K 3.8 4.0  CL 102 102  CO2 29 29  GLUCOSE 84 73  BUN 10 9  CREATININE 0.66 0.68  CALCIUM 8.1* 8.3*    PT/INR: No results for input(s): LABPROT, INR in the last 72 hours. ABG:  INR: Will add last result for INR, ABG once components are confirmed Will add last 4 CBG results once components are confirmed  Assessment/Plan:  1. CV - SR 2.  Pulmonary - Chest tube with 200 cc recorded last 24 hours.  CXR this am appears to show subcutaneous emphysema right lateral chest wall (no worse than prior), does not appear to have right apical pneumothorax this am. Pleural culture shows no growth < 24 hours. Hopefully, continues to improve and avoid surgery 3. Regarding pain, will add scheduled low dose Toradol  for several doses  Donielle M ZimmermanPA-C 05/01/2024,7:24 AM  Agree with above.  CXR looks good. It will take a while for the subcutaneous emphysema to resolve.

## 2024-05-01 NOTE — Plan of Care (Signed)
   Problem: Education: Goal: Knowledge of General Education information will improve Description Including pain rating scale, medication(s)/side effects and non-pharmacologic comfort measures Outcome: Progressing   Problem: Health Behavior/Discharge Planning: Goal: Ability to manage health-related needs will improve Outcome: Progressing

## 2024-05-02 ENCOUNTER — Inpatient Hospital Stay (HOSPITAL_COMMUNITY)

## 2024-05-02 DIAGNOSIS — T797XXA Traumatic subcutaneous emphysema, initial encounter: Secondary | ICD-10-CM | POA: Diagnosis not present

## 2024-05-02 DIAGNOSIS — Z4682 Encounter for fitting and adjustment of non-vascular catheter: Secondary | ICD-10-CM | POA: Diagnosis not present

## 2024-05-02 DIAGNOSIS — J939 Pneumothorax, unspecified: Secondary | ICD-10-CM | POA: Diagnosis not present

## 2024-05-02 DIAGNOSIS — J90 Pleural effusion, not elsewhere classified: Secondary | ICD-10-CM | POA: Diagnosis not present

## 2024-05-02 MED ORDER — MORPHINE SULFATE (PF) 4 MG/ML IV SOLN
4.0000 mg | INTRAVENOUS | Status: DC | PRN
Start: 2024-05-02 — End: 2024-05-03

## 2024-05-02 MED ORDER — PHENOL 1.4 % MT LIQD
1.0000 | OROMUCOSAL | Status: DC | PRN
  Administered 2024-05-02: 1 via OROMUCOSAL
  Filled 2024-05-02: qty 177

## 2024-05-02 MED ORDER — GUAIFENESIN-DM 100-10 MG/5ML PO SYRP
5.0000 mL | ORAL_SOLUTION | ORAL | Status: DC | PRN
  Administered 2024-05-02: 5 mL via ORAL
  Filled 2024-05-02 (×2): qty 5

## 2024-05-02 MED ORDER — NAPROXEN 250 MG PO TABS
375.0000 mg | ORAL_TABLET | Freq: Three times a day (TID) | ORAL | Status: DC | PRN
  Filled 2024-05-02: qty 1
  Filled 2024-05-02: qty 2

## 2024-05-02 MED ORDER — KETOROLAC TROMETHAMINE 15 MG/ML IJ SOLN
15.0000 mg | Freq: Four times a day (QID) | INTRAMUSCULAR | Status: DC
  Administered 2024-05-02: 15 mg via INTRAVENOUS
  Filled 2024-05-02: qty 1

## 2024-05-02 MED ORDER — KETOROLAC TROMETHAMINE 15 MG/ML IJ SOLN
15.0000 mg | Freq: Three times a day (TID) | INTRAMUSCULAR | Status: AC
  Administered 2024-05-02 – 2024-05-03 (×3): 15 mg via INTRAVENOUS
  Filled 2024-05-02 (×3): qty 1

## 2024-05-02 NOTE — Progress Notes (Signed)
 Overall looks pretty good.  Pain under good control.  No shortness of breath.  No back pain.  No lower extremity symptoms.  She is afebrile.  Vital signs are stable.  Chest tube output significantly lower with only 30 cc output yesterday.  No airleak.  No chest x-ray so far today.  Wound clean and dry.  Chest clear.  Abdomen soft.  Motor and sensory function intact.  Progressing well.  Hopefully can get chest tube out later today.  Okay to proceed toward discharge.  Follow-up with me in a week.

## 2024-05-02 NOTE — Progress Notes (Addendum)
      [address]       [place] [address]             [phone]           Subjective: Patient state Toradol  helped a lot with pain and did not make her sleepy.  Objective: Vital signs in last 24 hours: Temp:  [97.9 F (36.6 C)-98.5 F (36.9 C)] 97.9 F (36.6 C) (11/05 0309) Pulse Rate:  [59-74] 59 (11/05 0309) Cardiac Rhythm: Normal sinus rhythm (11/04 1959) Resp:  [13-19] 16 (11/05 0309) BP: (87-117)/(55-69) 100/64 (11/05 0309) SpO2:  [94 %-98 %] 95 % (11/05 0309)     Intake/Output from previous day: 11/04 0701 - 11/05 0700 In: -  Out: 30 [Chest Tube:30]   Physical Exam:  Cardiovascular: RRR Pulmonary: Clear to auscultation on left and slightly diminished right basilar breath sounds (is improving). Decreased subcutaneous emphysema right lateral chest wall (has had) Wounds: Dressing is clean and dry.   Chest Tube: to suction, light yellow drainage  Lab Results: CBC: Recent Labs    04/30/24 0604 05/01/24 0320  WBC 5.2 5.5  HGB 8.7* 9.1*  HCT 27.7* 28.8*  PLT 364 423*   BMET:  Recent Labs    04/30/24 0604 05/01/24 0320  NA 140 141  K 3.8 4.0  CL 102 102  CO2 29 29  GLUCOSE 84 73  BUN 10 9  CREATININE 0.66 0.68  CALCIUM 8.1* 8.3*    PT/INR: No results for input(s): LABPROT, INR in the last 72 hours. ABG:  INR: Will add last result for INR, ABG once components are confirmed Will add last 4 CBG results once components are confirmed  Assessment/Plan:  1. CV - SR 2.  Pulmonary - Chest tube with 30 cc recorded last 24 hours. Chest tube is to suction, no air leak.  Pleural culture shows no growth  for last 3 days. Drainage continues to decrease. As discussed with Dr. Lucas, since output low will remove chest tube. Check CXR in am. Will arrange follow up 3. Toradol  for several more doses    Kaelynne Christley M ZimmermanPA-C 05/02/2024,6:50 AM  Agree with above. Minimal chest tube output and CXR looks good. Remove CT today and  plan repeat CXR in am.

## 2024-05-02 NOTE — Progress Notes (Signed)
  Progress Note   Patient: Tammy Wilson FMW:[id number] DOB: Nov 05, 1965 DOA: 04/28/2024     4 DOS: the patient was seen and examined on 05/02/2024 at 9:07AM and 1:30 PM      Brief hospital course: 58 y.o. F with obesity, significant weight loss, hypothyroidism and recent thoracic discectomy/fusion by Dr. Louis due to thoracic myelopathy, who presented with right sided chest pain and SOB.  CTA ruled out PE but showed loculated right pleural effusion and subcutaneous emphtysema, small right basilar pneumothorax.          Assessment and Plan: * Pleural effusion on right Postoperative, fluid serous exudative, inflammatory.  Culture no growth.  Chest x-ray shows no pneumothorax this morning, resolution of pleural effusion on chest x-ray.    Chest tube out around 1:00 today, afterwards she had coughing and developed increased pain.  Repeat chest x-ray stat, showed no new pneumothorax - Resume Toradol  - Repeat chest x-ray tomorrow    Acute anemia -Repeat CBC tomorrow  Thoracic myelopathy S/p recent T6-8 transthoracic discectomy, fusion, right rib harvest for autograft by Dr. Louis on 10/27 Discussed with Dr. Gillie, chest tube okay from post-surgical standpoint  Moderate major depression (HCC) - Continue lorazepam , venlafaxine , trazodone , bupropion   S/P gastric bypass History of obesity   Postoperative hypothyroidism -Continue levothyroxine           Subjective: As above, chest tube was removed today, after which she had some increased pain, but vitals remained stable, chest x-ray showed no pneumothorax or airspace disease, or change in subcutaneous emphysema     Physical Exam: BP 122/68 (BP Location: Right Arm)   Pulse 68   Temp 98.1 F (36.7 C) (Oral)   Resp 17   Ht 5' 7 (1.702 m)   Wt 61.2 kg   SpO2 97%   BMI 21.13 kg/m   Adult female, lying in bed, interactive and appropriate RRR, no murmurs, no peripheral edema Respiratory rate even and unlabored,  breath sounds equal, no rales or wheezes. Attention normal, affect appropriate, judgment and insight appear normal  After removal of chest tube I returned to bedside, she had no tachycardia or hypoxia, she was splinting and holding her right side, and appeared to be in pain, but lung sounds remain normal, and chest x-ray as above    Data Reviewed: Discussed with cardiothoracic surgery    Family Communication: Husband and daughter at the bedside    Disposition: Status is: Inpatient         Author: Lonni SHAUNNA Dalton, MD 05/02/2024 4:25 PM  For on call review [email or web address].

## 2024-05-02 NOTE — Progress Notes (Signed)
 Per order, this RN removed patient's right chest tube with charge nurse present for supervision. Patient tolerated removal well, and catheter tip intact. Patient's site is dressed with petroleum, 4x4 gauze, and paper tape. This RN administered 10 mg of oxycodone  for 4/10 pain.

## 2024-05-03 ENCOUNTER — Other Ambulatory Visit (HOSPITAL_COMMUNITY): Payer: Self-pay

## 2024-05-03 ENCOUNTER — Inpatient Hospital Stay (HOSPITAL_COMMUNITY)

## 2024-05-03 DIAGNOSIS — J90 Pleural effusion, not elsewhere classified: Secondary | ICD-10-CM | POA: Diagnosis not present

## 2024-05-03 DIAGNOSIS — S2241XA Multiple fractures of ribs, right side, initial encounter for closed fracture: Secondary | ICD-10-CM | POA: Diagnosis not present

## 2024-05-03 DIAGNOSIS — J939 Pneumothorax, unspecified: Secondary | ICD-10-CM | POA: Diagnosis not present

## 2024-05-03 LAB — CBC
HCT: 27.9 % — ABNORMAL LOW (ref 36.0–46.0)
Hemoglobin: 8.9 g/dL — ABNORMAL LOW (ref 12.0–15.0)
MCH: 29.2 pg (ref 26.0–34.0)
MCHC: 31.9 g/dL (ref 30.0–36.0)
MCV: 91.5 fL (ref 80.0–100.0)
Platelets: 477 K/uL — ABNORMAL HIGH (ref 150–400)
RBC: 3.05 MIL/uL — ABNORMAL LOW (ref 3.87–5.11)
RDW: 13.3 % (ref 11.5–15.5)
WBC: 6.7 K/uL (ref 4.0–10.5)
nRBC: 0 % (ref 0.0–0.2)

## 2024-05-03 LAB — BASIC METABOLIC PANEL WITH GFR
Anion gap: 8 (ref 5–15)
BUN: 16 mg/dL (ref 6–20)
CO2: 28 mmol/L (ref 22–32)
Calcium: 7.9 mg/dL — ABNORMAL LOW (ref 8.9–10.3)
Chloride: 105 mmol/L (ref 98–111)
Creatinine, Ser: 0.63 mg/dL (ref 0.44–1.00)
GFR, Estimated: >60 mL/min (ref >60)
Glucose, Bld: 91 mg/dL (ref 70–99)
Potassium: 3.7 mmol/L (ref 3.5–5.1)
Sodium: 141 mmol/L (ref 135–145)

## 2024-05-03 MED ORDER — OXYCODONE-ACETAMINOPHEN 10-325 MG PO TABS
1.0000 | ORAL_TABLET | ORAL | 0 refills | Status: AC | PRN
  Filled 2024-05-03: qty 30, 5d supply, fill #0

## 2024-05-03 MED ORDER — METHOCARBAMOL 500 MG PO TABS
500.0000 mg | ORAL_TABLET | Freq: Four times a day (QID) | ORAL | 0 refills | Status: AC | PRN
  Filled 2024-05-03 (×3): qty 40, 10d supply, fill #0

## 2024-05-03 MED ORDER — KETOROLAC TROMETHAMINE 10 MG PO TABS
10.0000 mg | ORAL_TABLET | Freq: Three times a day (TID) | ORAL | 0 refills | Status: AC
  Filled 2024-05-03: qty 9, 3d supply, fill #0

## 2024-05-03 NOTE — Progress Notes (Signed)
 Discharge education and AVS given to pt and pt's husband. All questions answered. PIV removed with no complications. Tele removed and CCMD called. [facility] drsg to left back c/d/I. Pt transported off unit via wheelchair with NT and all personal belongings. TOC meds given to pt. Pt's husband to transport pt home.

## 2024-05-03 NOTE — Progress Notes (Signed)
 Overall looks much better.  Breathing easily.  Patient with right sided chest wall pain which is mostly incisional and related to rib harvest.  Oxygenating well.  Afebrile.  Vital signs are stable.  Chest x-ray overall looks reasonably good with minimal pleural effusion.  She is awake and alert.  She is oriented and appropriate.  Her neurologic function is intact.  Her wound is clean and dry.  Overall doing well.  Okay for discharge from my standpoint.  Follow-up in my office in 1 week.

## 2024-05-03 NOTE — TOC Transition Note (Signed)
 Transition of Care [facility]) - Discharge Note   Patient Details  Name: Tammy Wilson MRN: [id number] Date of Birth: Oct 03, 1965  [facility]) CM/SW Contact:  Marval Gell, RN Phone Number: 05/03/2024, 10:58 AM   Clinical Narrative:     Per handoff, patient has been set up with [facility]. Notified liaison of DC today.     Barriers to Discharge: Continued Medical Work up   Patient Goals and CMS Choice            Discharge Placement                       Discharge Plan and Services Additional resources added to the After Visit Summary for                                Date [facility] Agency Contacted: 05/03/24 Time HH Agency Contacted: 1058 Representative spoke with at [facility] Agency: Arna  Social Drivers of Health (SDOH) Interventions SDOH Screenings   Food Insecurity: No Food Insecurity (04/28/2024)  Housing: Low Risk  (04/28/2024)  Transportation Needs: No Transportation Needs (04/28/2024)  Utilities: Not At Risk (04/28/2024)  Depression (PHQ2-9): Low Risk  (07/29/2020)  Tobacco Use: Low Risk  (04/29/2024)     Readmission Risk Interventions    04/26/2024   11:58 AM  Readmission Risk Prevention Plan  Post Dischage Appt Complete  Medication Screening Complete  Transportation Screening Complete

## 2024-05-03 NOTE — Progress Notes (Addendum)
      [address]       [place] [address]             [phone]           Subjective: Patient with complaints of pain under right breast (likely related to surgery-rib harvest for autograft)  Objective: Vital signs in last 24 hours: Temp:  [97.8 F (36.6 C)-98.9 F (37.2 C)] 97.8 F (36.6 C) (11/06 0342) Pulse Rate:  [60-68] 60 (11/06 0342) Cardiac Rhythm: Normal sinus rhythm (11/05 1900) Resp:  [17-18] 18 (11/06 0342) BP: (106-122)/(58-69) 106/58 (11/06 0342) SpO2:  [94 %-98 %] 95 % (11/06 0342)     Intake/Output from previous day: 11/05 0701 - 11/06 0700 In: 240 [P.O.:240] Out: -    Physical Exam:  Cardiovascular: RRR Pulmonary: Mostly clear.  Decreasing subcutaneous emphysema right lateral chest wall  Wounds: Dressing is clean and dry.     Lab Results: CBC: Recent Labs    05/01/24 0320 05/03/24 0204  WBC 5.5 6.7  HGB 9.1* 8.9*  HCT 28.8* 27.9*  PLT 423* 477*   BMET:  Recent Labs    05/01/24 0320 05/03/24 0204  NA 141 141  K 4.0 3.7  CL 102 105  CO2 29 28  GLUCOSE 73 91  BUN 9 16  CREATININE 0.68 0.63  CALCIUM 8.3* 7.9*    PT/INR: No results for input(s): LABPROT, INR in the last 72 hours. ABG:  INR: Will add last result for INR, ABG once components are confirmed Will add last 4 CBG results once components are confirmed  Assessment/Plan:  1. CV - SR 2.  Pulmonary - Chest tube removed yesterday. PA/LAT CXR this am appears stable (small right pleural effusion, decreasing subcutaneous emphysema right chest wall. Pleural culture shows no growth  for last 3 days.  3. Follow up arranged   Aerionna Moravek M ZimmermanPA-C 05/03/2024,6:58 AM

## 2024-05-03 NOTE — Discharge Summary (Signed)
 Physician Discharge Summary   Patient: Tammy Wilson MRN: [id number] DOB: 11-Mar-1966  Admit date:     04/28/2024  Discharge date: 05/03/24  Discharge Physician: Lonni SHAUNNA Dalton   PCP: Jackolyn Darice BROCKS, FNP     Recommendations at discharge:  Follow up with CT surgery in 1 week for pleural effusion Follow up with Neurosurgery Dr. Louis as directed Darice Jackolyn: Please check CBC in 4-6 weeks (discharge Hgb 8.9 g/dL)     Discharge Diagnoses: Principal Problem:   Pleural effusion on right Active Problems:   Postoperative hypothyroidism   S/P gastric bypass   Moderate major depression (HCC)   Thoracic myelopathy   Acute anemia      Hospital Course: 58 y.o. F with obesity, significant weight loss, hypothyroidism and recent thoracic discectomy/fusion by Dr. Louis due to thoracic myelopathy, who presented with right sided chest pain and SOB.  CTA ruled out PE but showed loculated right pleural effusion and subcutaneous emphtysema, small right basilar pneumothorax.       Assessment and Plan: * Pleural effusion on right Patient admitted with postoperative right-sided pleural effusion.  Chest tube was placed, fluid appeared exudative, without significant blood.  Cultures no growth.  Chest tube removed, chest x-ray showed known rib harvest (described as displaced fracture on radiography report, discussed with Dr. Joan, this is simply the harvest site per his interpretation).  Discharged with 5 days Toradol , Percocet, CT surgery follow up.   Acute anemia She has had a postop drop in her hemoglobin from baseline 12 --> 9 post op.  Fluid was serous and not bloody.  Recommend PCP follow-up in 4 to 6 weeks.  Thoracic myelopathy S/p recent T6-8 transthoracic discectomy, fusion, right rib harvest for autograft by Dr. Louis on 10/27 Doing well from a neurological standpoint.  Moderate major depression (HCC) Stable on venlafaxine , lorazepam , bupropion , trazodone   S/P  gastric bypass History of obesity   Postoperative hypothyroidism Recent TSH was low, endocrinology had recently reduced her dose, now stable on levothyroxine  100 daily            The [place]  Controlled Substances Registry was reviewed for this patient prior to discharge.  Consultants: Cardiothoracic surgery Interventional radiology  Procedures performed: Chest tube placement Disposition: Home health Diet recommendation:  Regular diet  DISCHARGE MEDICATION: Allergies as of 05/03/2024       Reactions   Cipro [ciprofloxacin Hcl] Other (See Comments)   Bronchospasm    Dilaudid [hydromorphone Hcl] Palpitations   Sulfa Antibiotics Itching, Rash        Medication List     STOP taking these medications    chlorhexidine  4 % external liquid Commonly known as: HIBICLENS    Oxycodone  HCl 10 MG Tabs       TAKE these medications    buPROPion  300 MG 24 hr tablet Commonly known as: WELLBUTRIN  XL Take 300 mg by mouth daily.   FIBER PO Take 5 each by mouth at bedtime. OTC fiber gummy   gabapentin  300 MG capsule Commonly known as: NEURONTIN  Take 1 capsule (300 mg total) by mouth 3 (three) times daily.   ketorolac  10 MG tablet Commonly known as: TORADOL  Take 1 tablet (10 mg total) by mouth 3 (three) times daily for 3 days.   levothyroxine  100 MCG tablet Commonly known as: SYNTHROID  Take 100 mcg by mouth at bedtime.   methocarbamol  500 MG tablet Commonly known as: ROBAXIN  Take 1 tablet (500 mg total) by mouth every 6 (six) hours as needed for muscle spasms.  mupirocin ointment 2 % Commonly known as: BACTROBAN Place 1 Application into the nose 2 (two) times daily for 60 doses. Use as directed 2 times daily for 5 days every other week for 6 weeks.   oxyCODONE -acetaminophen  10-325 MG tablet Commonly known as: Percocet Take 1 tablet by mouth every 4 (four) hours as needed for pain.   pantoprazole  40 MG tablet Commonly known as: PROTONIX  Take 1 tablet  (40 mg total) by mouth 2 (two) times daily before a meal. What changed: when to take this   venlafaxine  XR 150 MG 24 hr capsule Commonly known as: EFFEXOR -XR Take 150 mg by mouth daily. with food   venlafaxine  XR 75 MG 24 hr capsule Commonly known as: EFFEXOR -XR Take 75 mg by mouth daily.   VITAMIN B-12 PO Take 1 tablet by mouth daily.               Discharge Care Instructions  (From admission, onward)           Start     Ordered   05/03/24 0000  Discharge wound care:       Comments: Contnue all instructions from Dr. Louis  For the chest tube site, you may just cover with a bandaid You may shower if cleared by Dr. Louis   05/03/24 1023            Follow-up Information     Rutha Manuelita HERO, PA-C. Go on 05/14/2024.   Specialties: Physician Assistant, Thoracic Surgery Why: Please arrive by 9:30 am in order to have PA/LAT CXR taken PRIOR to office appointment with L. Fenyak PA-C. CXR is located in the same building as the office appointment, on the SECOND floor. Appointment time is at 10:30 am Contact information: [address], [place] [address] [phone]         Louis Shove, MD Follow up.   Specialty: Neurosurgery Contact information: [address] [place] [address] [phone]         [place], [facility] Follow up.   Specialty: Home Health Services Why: for home health services Contact information: [address] [place] [address] [place] [address] [phone]                 Discharge Instructions     Discharge instructions   Complete by: As directed    **IMPORTANT DISCHARGE INSTRUCTIONS**   From Dr. Jonel: You were admitted for a pleural effusion You had a chest tube placed, which drained the effusion  The fluid culture did not grow any bacteria, there does not appear to be any infection The analysis of the fluid showed mostly inflammatory cells, which is not  totally unexpected after surgery (in other words, it's pretty normal)  Take acetaminophen  plus oxycodone  for pain Make sure to take a bowel regimen while on oxycodone , as this causes constipation (this regimen should include senna as a stimulant and MiraLAX  as a softener, tailor it to your situation)  You can alternate oxycodone -acetaminophen  with: Toradol  10 mg three times daily Methocarbamol /Robaxin  three times daily Gabapentin     Go see the CT surgery office at the date and time listed below [facility], in the To Do section)  Make sure you keep your follow up appointments with Dr. Louis   Discharge wound care:   Complete by: As directed    Contnue all instructions from Dr. Louis  For the chest tube site, you may just cover with a bandaid You may  shower if cleared by Dr. Louis   Increase activity slowly   Complete by: As directed        Discharge Exam: Filed Weights   04/28/24 1201  Weight: 61.2 kg    General: Pt is alert, awake, appears somewhat uncomfortable, but no acute distress Cardiovascular: RRR, nl S1-S2, no murmurs appreciated.   No LE edema.   Respiratory: Normal respiratory rate and rhythm.  Breath sounds are even, no rales or wheezing appreciated Abdominal: Abdomen soft and non-tender.  No distension or HSM.   Neuro/Psych: Strength symmetric in upper and lower extremities.  Judgment and insight appear normal.   Condition at discharge: good  The results of significant diagnostics from this hospitalization (including imaging, microbiology, ancillary and laboratory) are listed below for reference.   Imaging Studies: DG Chest 2 View Result Date: 05/03/2024 EXAM: 2 VIEW(S) XRAY OF THE CHEST 05/03/2024 06:41:00 AM COMPARISON: 05/02/2024 CLINICAL HISTORY: Pneumothorax FINDINGS: LINES, TUBES AND DEVICES: Surgical clips in lower neck. Mid thoracic fixation hardware noted. LUNGS AND PLEURA: Patchy airspace opacity in left mid to lower lung zone, stable, likely atelectasis.  Atelectatic changes at right lung base. Small right pleural effusion. No pulmonary edema. No pneumothorax. HEART AND MEDIASTINUM: No acute abnormality of the cardiac and mediastinal silhouettes. BONES AND SOFT TISSUES: Displaced right rib fractures, unchanged. Persistent right chest wall subcutaneous emphysema. IMPRESSION: 1. Small right pleural effusion. 2. Persistent right chest wall subcutaneous emphysema. 3. Displaced right rib fractures, unchanged. Electronically signed by: Waddell Calk MD 05/03/2024 07:27 AM EST RP Workstation: [id number]   DG CHEST PORT 1 VIEW Result Date: 05/02/2024 CLINICAL DATA:  Chest tube in place. EXAM: PORTABLE CHEST 1 VIEW COMPARISON:  Chest radiograph dated 05/02/2024. FINDINGS: The right pigtail chest tube is not visualized and has been removed. No detectable pneumothorax prior radiograph. Right lung base atelectasis. Stable cardiac silhouette. Right chest wall soft tissue emphysema. Displaced right rib fractures. IMPRESSION: Interval removal of the right chest tube. No detectable pneumothorax. Electronically Signed   By: Vanetta Chou M.D.   On: 05/02/2024 14:16   DG CHEST PORT 1 VIEW Result Date: 05/02/2024 EXAM: 1 VIEW(S) XRAY OF THE CHEST 05/02/2024 07:08:18 AM COMPARISON: Yesterday. CLINICAL HISTORY: [id number] Pneumothorax [id number] Pneumothorax. FINDINGS: LINES, TUBES AND DEVICES: Right-sided chest tube is unchanged stable. LUNGS AND PLEURA: No focal pulmonary opacity. No pulmonary edema. No pleural effusion. No definite pneumothorax is noted currently. HEART AND MEDIASTINUM: No acute abnormality of the cardiac and mediastinal silhouettes. BONES AND SOFT TISSUES: Right-sided subcutaneous emphysema. No acute osseous abnormality. IMPRESSION: 1. No definite pneumothorax. 2. Stable right-sided subcutaneous emphysema. 3. Unchanged right-sided chest tube. Electronically signed by: Lynwood Seip MD 05/02/2024 08:44 AM EST RP Workstation: [id number]   DG CHEST PORT 1 VIEW Result  Date: 05/01/2024 CLINICAL DATA:  Follow-up right chest tube and trace residual right apical pneumothorax. EXAM: PORTABLE CHEST 1 VIEW COMPARISON:  04/30/2024 FINDINGS: The right basilar pigtail pleural catheter is unchanged. Stable normal sized heart. Slightly increased size of a minimal right apical pneumothorax. No significant change in right subcutaneous emphysema. Mild bilateral lower lung zone atelectasis, mildly decreased on the right and mildly increased on the left. Stable thoracic and cervical spine fixation hardware in bilateral thoracic inlet surgical clips. IMPRESSION: 1. Slightly increased size of a minimal right apical pneumothorax. 2. Mild bilateral lower lung zone atelectasis, mildly decreased on the right and mildly increased on the left. Electronically Signed   By: Elspeth Bathe M.D.   On: 05/01/2024 16:33   DG CHEST  PORT 1 VIEW Result Date: 04/30/2024 EXAM: 1 VIEW(S) XRAY OF THE CHEST 04/30/2024 08:27:00 AM COMPARISON: 04/28/2024 CLINICAL HISTORY: Pleural effusion on right FINDINGS: LINES, TUBES AND DEVICES: New right pigtail thoracostomy tube with distal end overlying the medial right lung base. LUNGS AND PLEURA: Small right pleural effusion. Nearly resolved trace right apical pneumothorax. Minimal patchy opacities in right lung base. No pulmonary edema. HEART AND MEDIASTINUM: No acute abnormality of the cardiac and mediastinal silhouettes. BONES AND SOFT TISSUES: Surgical clips in neck. Persistent extensive right chest wall subcutaneous emphysema. Stable spinal fusion hardware in mid thoracic spine. No acute osseous abnormality. IMPRESSION: 1. New right pigtail thoracostomy tube with narrowing resolved right apical pneumothorax. 2. Small right pleural effusion. 3. Minimal patchy opacities in the right lung base. 4. Persistent extensive right chest wall subcutaneous emphysema. Electronically signed by: Norman Gatlin MD 04/30/2024 03:03 PM EST RP Workstation: [id number]   CT PERC PLEURAL  DRAIN W/INDWELL CATH W/IMG GUIDE Result Date: 04/29/2024 INDICATION: 58 year old with a right hydropneumothorax following thoracic spine surgery. Plan for image guided chest tube placement. EXAM: IMAGE GUIDED PLACEMENT OF RIGHT CHEST TUBE MEDICATIONS: Moderate sedation ANESTHESIA/SEDATION: Moderate (conscious) sedation was employed during this procedure. A total of Versed  2mg  and fentanyl  100 mcg was administered intravenously at the order of the provider performing the procedure. Total intra-service moderate sedation time: 60 minutes. Patient's level of consciousness and vital signs were monitored continuously by radiology nurse throughout the procedure under the supervision of the provider performing the procedure. COMPLICATIONS: None immediate. PROCEDURE: Informed written consent was obtained from the patient after a thorough discussion of the procedural risks, benefits and alternatives. All questions were addressed. Maximal Sterile Barrier Technique was utilized including caps, mask, sterile gowns, sterile gloves, sterile drape, hand hygiene and skin antiseptic. A timeout was performed prior to the initiation of the procedure. Patient was placed on her left side. CT images were obtained. The right side of the chest was evaluated with ultrasound. Pocket of fluid in the right lower chest was targeted using ultrasound. Posterolateral aspect of the right chest was prepped with chlorhexidine  and sterile field was created. Skin was anesthetized with 1% lidocaine . A small incision was made. Using ultrasound guidance, Yueh catheter was directed into the pleural fluid and serosanguineous fluid was aspirated. Superstiff Amplatz wire was placed and wire position was confirmed with CT imaging. Tract was dilated to accommodate a 14 French multipurpose drain. CT images confirmed chest tube placement in the right pleural space. Chest tube was attached to a chest drainage system and wall suction. Additional serosanguineous  fluid was removed. Drain was sutured to skin and a bandage was placed. RADIATION DOSE REDUCTION: This exam was performed according to the departmental dose-optimization program which includes automated exposure control, adjustment of the mA and/or kV according to patient size and/or use of iterative reconstruction technique. FINDINGS: Large amount of subcutaneous gas in the right chest. Right hydropneumothorax predominantly in the right lower chest. Postsurgical changes with a partial right rib resection. Chest tube placed in the right pleural space. Serosanguineous fluid was aspirated. IMPRESSION: Successful placement of a right chest tube using image guidance. Electronically Signed   By: Juliene Balder M.D.   On: 04/29/2024 16:24   CT Angio Chest PE W and/or Wo Contrast Result Date: 04/28/2024 EXAM: CTA of the Chest with contrast for PE 04/28/2024 02:46:41 PM TECHNIQUE: CTA of the chest was performed without and with the administration of 75 mL of iohexol (OMNIPAQUE) 350 MG/ML injection. Multiplanar reformatted images are provided  for review. MIP images are provided for review. Automated exposure control, iterative reconstruction, and/or weight based adjustment of the mA/kV was utilized to reduce the radiation dose to as low as reasonably achievable. COMPARISON: 05/30/2023 CLINICAL HISTORY: Pulmonary embolism (PE) suspected, high prob. FINDINGS: PULMONARY ARTERIES: Pulmonary arteries are adequately opacified for evaluation. No pulmonary embolism. Main pulmonary artery is normal in caliber. MEDIASTINUM: The heart and pericardium demonstrate no acute abnormality. There is no acute abnormality of the thoracic aorta. Moderately dilated esophagus is noted filled with food or debris. Status post thyroidectomy. LYMPH NODES: No mediastinal, hilar or axillary lymphadenopathy. LUNGS AND PLEURA: Minimal left posterior basilar atelectasis. Stable bilateral pulmonary nodules are noted compared to prior exam. Moderate-sized  multiloculated right pleural effusion is noted. Small right basilar pneumothorax may be present. UPPER ABDOMEN: Limited images of the upper abdomen are unremarkable. SOFT TISSUES AND BONES: Status post surgical lateral fusion extending from T6 to T8. Status post surgical resection of portion of right sixth rib posteriorly. Extensive subcutaneous emphysema is seen in the right lateral chest wall and right supraclavicular region. IMPRESSION: 1. No evidence of pulmonary embolism. 2. Moderate-sized multiloculated right pleural effusion. 3. Small right basilar pneumothorax may be present. 4. Extensive subcutaneous emphysema in the right lateral chest wall and right supraclavicular region. 5. Moderately dilated esophagus filled with food or debris. Electronically signed by: Lynwood Seip MD 04/28/2024 03:14 PM EDT RP Workstation: [id number]   DG Chest Portable 1 View Result Date: 04/28/2024 CLINICAL DATA:  Shortness of breath. Right chest pain since last night. Mid back surgery last week. EXAM: PORTABLE CHEST 1 VIEW COMPARISON:  04/25/2024, 04/16/2024 and 04/23/2024 FINDINGS: Right paravertebral fusion hardware over the midthoracic spine intact and unchanged. Partially visualized fusion hardware over the cervicothoracic junction. Surgical clips over the neck base unchanged. Lungs are hypoinflated with minimal patchy opacification bilaterally which may be due to infection. Minimal right base opacification likely small amount of pleural fluid with atelectasis. Tiny right apical pneumothorax unchanged from 04/16/2024. Cardiomediastinal silhouette is normal. Subcutaneous emphysema over the right neck base and right flank without significant change. IMPRESSION: 1. Hypoinflation with minimal patchy opacification bilaterally which may be due to infection. Minimal right base opacification likely small amount of pleural fluid with atelectasis. 2. Tiny right apical pneumothorax unchanged from 04/24/2024. 3. Stable subcutaneous  emphysema over the right neck base and right flank. Electronically Signed   By: Toribio Agreste M.D.   On: 04/28/2024 12:49   DG CHEST PORT 1 VIEW Result Date: 04/25/2024 CLINICAL DATA:  Follow-up right pneumothorax. EXAM: PORTABLE CHEST 1 VIEW COMPARISON:  04/24/2024 FINDINGS: The previously demonstrated tiny right apical pneumothorax is no longer visualized. No significant change in extensive subcutaneous emphysema on the right. Mildly increased right basilar atelectasis. Clear left lung. Normal-sized heart. Thoracic and cervical spine fixation hardware. Bilateral thoracic inlet surgical clips. IMPRESSION: 1. The previously demonstrated tiny right apical pneumothorax is no longer visualized. 2. Mildly increased right basilar atelectasis. 3. Stable extensive right subcutaneous emphysema. Electronically Signed   By: Elspeth Bathe M.D.   On: 04/25/2024 11:54   DG CHEST PORT 1 VIEW Result Date: 04/24/2024 EXAM: 1 VIEW(S) XRAY OF THE CHEST 04/24/2024 05:58:10 AM COMPARISON: 04/23/2024 CLINICAL HISTORY: Pneumothorax, post-op thoracic surgery; rover. FINDINGS: LUNGS AND PLEURA: Significant decrease in right lower lung airspace opacity with mild residual subsegmental atelectasis seen. Tiny right apical and basilar pneumothorax, significantly decreased in size since prior exam. No pulmonary edema. No pleural effusion. HEART AND MEDIASTINUM: No acute abnormality of the cardiac and  mediastinal silhouettes. BONES AND SOFT TISSUES: Cervical spine ACDF noted. Prior right thoracotomy defect again noted. Thoracic spine fusion hardware again noted. Persistent subcutaneous emphysema of right chest wall. No acute osseous abnormality. IMPRESSION: 1. Tiny right apical and basilar pneumothorax, significantly decreased in size since the prior exam. 2. Significant decrease in right lower lung airspace opacity with mild residual subsegmental atelectasis. 3. Persistent subcutaneous emphysema throughout the right chest wall.  Electronically signed by: Norleen Kil MD 04/24/2024 06:39 AM EDT RP Workstation: [id number]   DG CHEST PORT 1 VIEW Addendum Date: 04/23/2024 ADDENDUM REPORT: 04/23/2024 19:35 ADDENDUM: Critical Value/emergent results were called by telephone at the time of interpretation on 04/23/2024 at 7:35 pm to provider [facility] , who verbally acknowledged these results. Electronically Signed   By: Andrea Gasman M.D.   On: 04/23/2024 19:35   Result Date: 04/23/2024 CLINICAL DATA:  Pneumothorax. EXAM: PORTABLE CHEST 1 VIEW COMPARISON:  None Available. FINDINGS: Moderate to large right pneumothorax measures 4 cm from the lung apex. There is likely leftward mediastinal shift. Collapsed right infrahilar lung. Subcutaneous emphysema in the right chest wall. Thoracic spinal fusion hardware. The heart is normal in size. IMPRESSION: 1. Moderate to large right pneumothorax with likely leftward mediastinal shift. 2. Subcutaneous emphysema in the right chest wall. Electronically Signed: By: Andrea Gasman M.D. On: 04/23/2024 19:21   DG Thoracic Spine 2 View Result Date: 04/23/2024 CLINICAL DATA:  Elective surgery. EXAM: THORACIC SPINE 2 VIEWS COMPARISON:  None Available. FINDINGS: Two fluoroscopic spot views of the thoracic spine submitted from the operating room. Lateral plate and screw fixation in the midthoracic spine with 2 level interbody spacers. Fluoroscopy time 2 minutes 7 seconds. Dose 17.32 mGy. IMPRESSION: Intraoperative fluoroscopy during thoracic spine surgery. Electronically Signed   By: Andrea Gasman M.D.   On: 04/23/2024 19:22   DG C-Arm 1-60 Min-No Report Result Date: 04/23/2024 Fluoroscopy was utilized by the requesting physician.  No radiographic interpretation.   DG C-Arm 1-60 Min-No Report Result Date: 04/23/2024 Fluoroscopy was utilized by the requesting physician.  No radiographic interpretation.   DG C-Arm 1-60 Min-No Report Result Date: 04/23/2024 Fluoroscopy was utilized by the  requesting physician.  No radiographic interpretation.    Microbiology: Results for orders placed or performed during the hospital encounter of 04/28/24  Aerobic/Anaerobic Culture w Gram Stain (surgical/deep wound)     Status: None (Preliminary result)   Collection Time: 04/29/24  3:37 PM   Specimen: Pleural Fluid  Result Value Ref Range Status   Specimen Description PLEURAL  Final   Special Requests NONE  Final   Gram Stain RARE WBC SEEN NO ORGANISMS SEEN   Final   Culture   Final    NO GROWTH 4 DAYS NO ANAEROBES ISOLATED; CULTURE IN PROGRESS FOR 5 DAYS Performed at [facility], [address] [place], [place] [address]    Report Status PENDING  Incomplete    Labs: CBC: Recent Labs  Lab 04/28/24 1221 04/29/24 0358 04/30/24 0604 05/01/24 0320 05/03/24 0204  WBC 5.6 4.8 5.2 5.5 6.7  NEUTROABS 4.1  --   --   --   --   HGB 9.3* 9.6* 8.7* 9.1* 8.9*  HCT 29.6* 30.2* 27.7* 28.8* 27.9*  MCV 92.2 91.2 91.7 92.9 91.5  PLT 346 389 364 423* 477*   Basic Metabolic Panel: Recent Labs  Lab 04/28/24 1221 04/29/24 0358 04/30/24 0604 05/01/24 0320 05/03/24 0204  NA 139 138 140 141 141  K 4.0 3.7 3.8 4.0 3.7  CL 102 100 102 102 105  CO2 27 27 29 29 28   GLUCOSE 95 80 84 73 91  BUN 9 8 10 9 16   CREATININE 0.57 0.55 0.66 0.68 0.63  CALCIUM 8.1* 8.1* 8.1* 8.3* 7.9*   Liver Function Tests: Recent Labs  Lab 04/28/24 1221 04/30/24 0604  AST 21 16  ALT 14 12  ALKPHOS 48 45  BILITOT 0.4 0.3  PROT 6.2* 5.5*  ALBUMIN  2.5* 2.2*   CBG: No results for input(s): GLUCAP in the last 168 hours.  Discharge time spent: approximately 45 minutes spent on discharge counseling, evaluation of patient on day of discharge, and coordination of discharge planning with nursing, social work, pharmacy and case management  Signed: Lonni SHAUNNA Dalton, MD [facility] 05/03/2024

## 2024-05-04 LAB — AEROBIC/ANAEROBIC CULTURE W GRAM STAIN (SURGICAL/DEEP WOUND): Culture: NO GROWTH

## 2024-05-11 ENCOUNTER — Other Ambulatory Visit: Payer: Self-pay | Admitting: Thoracic Surgery (Cardiothoracic Vascular Surgery)

## 2024-05-11 ENCOUNTER — Other Ambulatory Visit: Payer: Self-pay | Admitting: Surgery

## 2024-05-11 DIAGNOSIS — J90 Pleural effusion, not elsewhere classified: Secondary | ICD-10-CM

## 2024-05-14 ENCOUNTER — Ambulatory Visit (HOSPITAL_COMMUNITY)
Admission: RE | Admit: 2024-05-14 | Discharge: 2024-05-14 | Disposition: A | Discharge status: Home / Self Care | Payer: Self-pay | Source: Ambulatory Visit | Attending: Cardiology | Admitting: Cardiology

## 2024-05-14 ENCOUNTER — Ambulatory Visit

## 2024-05-14 VITALS — BP 137/80 | HR 87 | Resp 20 | Wt 140.0 lb

## 2024-05-14 DIAGNOSIS — R918 Other nonspecific abnormal finding of lung field: Secondary | ICD-10-CM | POA: Diagnosis not present

## 2024-05-14 DIAGNOSIS — J90 Pleural effusion, not elsewhere classified: Secondary | ICD-10-CM

## 2024-05-14 DIAGNOSIS — T797XXA Traumatic subcutaneous emphysema, initial encounter: Secondary | ICD-10-CM | POA: Diagnosis not present

## 2024-05-14 DIAGNOSIS — S2231XA Fracture of one rib, right side, initial encounter for closed fracture: Secondary | ICD-10-CM | POA: Diagnosis not present

## 2024-05-14 NOTE — Progress Notes (Signed)
 [address] [place] [address]             [phone]       HPI:  Patient returns for routine follow-up having undergone right sided chest tube placement by interventional radiology for post operative right hydropneumothorax on 04/29/2024. She underwent thoracic fusion with rib harvest with neurosurgery on 04/23/2024 by Dr. Louis.   Since hospital discharge the patient reports that she is doing well.  She has back and right chest wall pain from her procedure. This has been improving and she has been using robaxin  and tylenol  for the pain.   She has been moving around and staying active.  She denies shortness of breath, fever, and lower leg edema.   Allergies as of 05/14/2024       Reactions   Cipro [ciprofloxacin Hcl] Other (See Comments)   Bronchospasm    Dilaudid [hydromorphone Hcl] Palpitations   Sulfa Antibiotics Itching, Rash        Medication List        Accurate as of May 14, 2024 10:36 AM. If you have any questions, ask your nurse or doctor.          buPROPion  300 MG 24 hr tablet Commonly known as: WELLBUTRIN  XL Take 300 mg by mouth daily.   FIBER PO Take 5 each by mouth at bedtime. OTC fiber gummy   gabapentin  300 MG capsule Commonly known as: NEURONTIN  Take 1 capsule (300 mg total) by mouth 3 (three) times daily.   levothyroxine  100 MCG tablet Commonly known as: SYNTHROID  Take 100 mcg by mouth at bedtime.   methocarbamol  500 MG tablet Commonly known as: ROBAXIN  Take 1 tablet (500 mg total) by mouth every 6 (six) hours as needed for muscle spasms.   mupirocin ointment 2 % Commonly known as: BACTROBAN Place 1 Application into the nose 2 (two) times daily for 60 doses. Use as directed 2 times daily for 5 days every other week for 6 weeks.   oxyCODONE -acetaminophen  10-325 MG tablet Commonly known as: Percocet Take 1 tablet by mouth every 4 (four) hours as needed for pain.   pantoprazole  40 MG tablet Commonly  known as: PROTONIX  Take 1 tablet (40 mg total) by mouth 2 (two) times daily before a meal. What changed: when to take this   venlafaxine  XR 150 MG 24 hr capsule Commonly known as: EFFEXOR -XR Take 150 mg by mouth daily. with food   venlafaxine  XR 75 MG 24 hr capsule Commonly known as: EFFEXOR -XR Take 75 mg by mouth daily.   VITAMIN B-12 PO Take 1 tablet by mouth daily.         ROS Review of Systems  Constitutional:  Negative for chills and fever.  Respiratory:  Negative for cough and shortness of breath.   Cardiovascular:  Negative for chest pain and leg swelling.  Musculoskeletal:  Positive for back pain.      BP 137/80   Pulse 87   Resp 20   Wt 140 lb (63.5 kg)   SpO2 98%   BMI 21.93 kg/m   Physical Exam Constitutional:      Appearance: Normal appearance.  HENT:     Head: Normocephalic and atraumatic.  Musculoskeletal:     Cervical back: Normal range of motion.  Skin:    General: Skin is warm and dry.  Neurological:     General: No focal deficit present.     Mental Status: She is  alert.       Imaging: EXAM: 2 VIEW(S) XRAY OF THE CHEST 05/14/2024 09:55:00 AM   COMPARISON: 05/03/2024.   CLINICAL HISTORY: Right pleural effusion.   FINDINGS:   LUNGS AND PLEURA: Paracentral scattered bilateral pulmonary nodules are present as on prior exams. These measure up to about 1 cm in diameter. Stable blunting of the right lateral costophrenic angle primarily from scarring. No significant blunting of the posterior costophrenic angles. No pneumothorax.   HEART AND MEDIASTINUM: No acute abnormality of the cardiac and mediastinal silhouettes.   BONES AND SOFT TISSUES: Stable postoperative findings in the cervical and thoracic spine. Reduced subcutaneous emphysema on the right. Stable lucency associated with right 6th rib harvesting. Right lateral 7th rib fracture noted.   IMPRESSION: 1. Stable blunting of the right lateral costophrenic angle from  scarring. 2. Stable postoperative lucency from right 6th rib harvesting and right lateral 7th rib fracture. 3. Stable bilateral pulmonary nodules.   Electronically signed by: Ryan Salvage MD 05/14/2024 10:44 AM EST RP Workstation: [id number]   Assessment/Plan:  Pleural effusion on right -We reviewed today's chest xray.  Stable blunting of right lateral costophrenic angle from scarring.  No pneumothorax -She should continue to use incentive spirometer -She should continue to increase her activity/exercise as tolerated.  Follow guidelines given by neurosurgery -Discussed alarm symptoms and she states understanding of when to be evaluated -Follow up as scheduled with neurosurgeons office  -Follow up in one month with chest xray with [facility]   Manuelita CHRISTELLA Rough, PA-C 10:36 AM 05/14/24

## 2024-05-14 NOTE — Patient Instructions (Signed)
Follow up in 4 weeks with chest xray 

## 2024-06-01 ENCOUNTER — Ambulatory Visit (HOSPITAL_BASED_OUTPATIENT_CLINIC_OR_DEPARTMENT_OTHER)
Admission: EM | Admit: 2024-06-01 | Discharge: 2024-06-01 | Disposition: A | Discharge status: Home / Self Care | Attending: Family Medicine | Admitting: Family Medicine

## 2024-06-01 ENCOUNTER — Other Ambulatory Visit (HOSPITAL_BASED_OUTPATIENT_CLINIC_OR_DEPARTMENT_OTHER): Payer: Self-pay

## 2024-06-01 ENCOUNTER — Encounter (HOSPITAL_BASED_OUTPATIENT_CLINIC_OR_DEPARTMENT_OTHER): Payer: Self-pay

## 2024-06-01 DIAGNOSIS — L0592 Pilonidal sinus without abscess: Secondary | ICD-10-CM | POA: Diagnosis not present

## 2024-06-01 MED ORDER — AMOXICILLIN-POT CLAVULANATE 875-125 MG PO TABS
1.0000 | ORAL_TABLET | Freq: Two times a day (BID) | ORAL | 0 refills | Status: AC
  Filled 2024-06-01: qty 14, 7d supply, fill #0

## 2024-06-01 NOTE — Discharge Instructions (Addendum)
 Take the antibiotics as prescribed.  Follow-up with your doctor on Monday as planned.

## 2024-06-01 NOTE — ED Provider Notes (Signed)
 [facility]    CSN: [id number] Arrival date & time: 06/01/24  1404      History   Chief Complaint Chief Complaint  Patient presents with   pilonidal sinus    HPI Tammy Wilson is a 58 y.o. female.   Pt is a 58 year old female who presents today with drainage from gluteal area.  Patient states dx with pilonidal sinus when 58 years old. Had surgery. Reports pain and drainage started approx 2 weeks ago. States area to lower back. Concerned for infection. Appt to see pcp on Monday. Would like antibiotic before then.      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   Sarcoidosis     Patient Active Problem List   Diagnosis Date Noted   Pleural effusion on right 04/28/2024   Acute anemia 04/28/2024   Thoracic myelopathy 04/23/2024   Medication management 10/24/2023   PICC (peripherally inserted central catheter) removal 10/24/2023   Infection due to ESBL-producing Escherichia coli 10/24/2023   Pyelonephritis 10/02/2023   Hypokalemia 10/02/2023   Prolonged QT interval 10/02/2023   Mild protein malnutrition 10/02/2023   Hyperglycemia 10/02/2023   Hypomagnesemia 10/02/2023   Malaise and fatigue 02/22/2022   B12 deficiency 09/09/2021   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   Gastric bypass status for obesity 09/08/2020   S/P gastric bypass 09/08/2020   Mediastinal adenopathy 08/27/2016   Anxiety 08/18/2016   GERD (gastroesophageal reflux disease) 08/18/2016   Hypocalcemia 08/18/2016   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   bladder tack     GASTRIC ROUX-EN-Y N/A 09/08/2020   Procedure: LAPAROSCOPIC ROUX-EN-Y GASTRIC BYPASS WITH UPPER ENDOSCOPY, CONVERSION FROM LAPAROSCOPIC SLEEVE GASTECTOMY;  Surgeon: Gladis Cough, MD;  Location: [facility];  Service: General;  Laterality: N/A;  3.5 HOURS TOTAL PLEASE   HIATAL HERNIA REPAIR N/A 09/08/2020   Procedure: HERNIA REPAIR HIATAL;  Surgeon: Gladis Cough, MD;  Location: [facility];  Service: General;  Laterality: N/A;   LAPAROSCOPIC GASTRIC SLEEVE RESECTION N/A 06/09/2015   Procedure: LAPAROSCOPIC GASTRIC SLEEVE RESECTION;  Surgeon: Cough Gladis, MD;  Location: [facility];  Service: General;  Laterality: N/A;   NECK SURGERY     Cervial fusion '12-[facility] - Dr. Louis   SINUSOTOMY     THYROIDECTOMY  2019   TUBAL LIGATION     UPPER GI ENDOSCOPY N/A 06/09/2015   Procedure: UPPER GI ENDOSCOPY;  Surgeon: Cough Gladis, MD;  Location: [facility];  Service: General;  Laterality: N/A;   UPPER GI ENDOSCOPY N/A 09/08/2020   Procedure: UPPER GI ENDOSCOPY;  Surgeon:  Gladis Cough, MD;  Location: [facility];  Service: General;  Laterality: N/A;    OB History   No obstetric history on file.      Home Medications    Prior to Admission medications   Medication Sig Start Date End Date Taking? Authorizing Provider  amoxicillin -clavulanate (AUGMENTIN ) 875-125 MG tablet Take 1 tablet by mouth every 12 (twelve) hours. 06/01/24  Yes Izabellah Dadisman A, FNP  buPROPion  (WELLBUTRIN  XL) 300 MG 24 hr tablet Take 300 mg  by mouth daily. 08/06/20   [provider]  Cyanocobalamin  (VITAMIN B-12 PO) Take 1 tablet by mouth daily.    [provider]  FIBER PO Take 5 each by mouth at bedtime. OTC fiber gummy    [provider]  gabapentin  (NEURONTIN ) 300 MG capsule Take 1 capsule (300 mg total) by mouth 3 (three) times daily. 04/26/24   Louis Shove, MD  levothyroxine  (SYNTHROID ) 100 MCG tablet Take 100 mcg by mouth at bedtime. 04/05/24   [provider]  methocarbamol  (ROBAXIN ) 500 MG tablet Take 1 tablet (500 mg total) by mouth every 6 (six) hours as needed for muscle spasms. 05/03/24   Danford, Lonni SQUIBB, MD  oxyCODONE -acetaminophen  (PERCOCET) 10-325 MG tablet Take 1 tablet by mouth every 4 (four) hours as needed for pain. 05/03/24 05/03/25  DanfordLonni SQUIBB, MD  pantoprazole  (PROTONIX ) 40 MG tablet Take 1 tablet (40 mg total) by mouth 2 (two) times daily before a meal. Patient taking differently: Take 40 mg by mouth 2 (two) times daily. 02/17/24 05/17/24  [place], PA-C  venlafaxine  XR (EFFEXOR -XR) 150 MG 24 hr capsule Take 150 mg by mouth daily. with food 08/06/20   [provider]  venlafaxine  XR (EFFEXOR -XR) 75 MG 24 hr capsule Take 75 mg by mouth daily. 04/12/24   [provider]    Family History Family History  Problem Relation Age of Onset   Heart disease Mother    Rheum arthritis Mother    Colon cancer Neg Hx    Esophageal cancer Neg Hx    Stomach cancer Neg Hx    Rectal cancer Neg Hx     Social History Social History   Tobacco Use   Smoking status: Never   Smokeless tobacco: Never  Vaping Use   Vaping status: Never Used  Substance Use Topics   Alcohol use: Not Currently    Comment: very rare   Drug use: No     Allergies   Cipro [ciprofloxacin hcl], Dilaudid [hydromorphone hcl], and Sulfa antibiotics   Review of Systems Review of Systems See HPI  Physical Exam Triage Vital Signs ED Triage Vitals  Encounter Vitals Group      BP 06/01/24 1433 122/83     Girls Systolic BP Percentile --      Girls Diastolic BP Percentile --      Boys Systolic BP Percentile --      Boys Diastolic BP Percentile --      Pulse Rate 06/01/24 1433 83     Resp 06/01/24 1433 20     Temp 06/01/24 1433 98.4 F (36.9 C)     Temp src --      SpO2 06/01/24 1433 97 %     Weight --      Height --      Head Circumference --      Peak Flow --      Pain Score 06/01/24 1435 4     Pain Loc --  Pain Education --      Exclude from Growth Chart --    No data found.  Updated Vital Signs BP 122/83 (BP Location: Right Arm)   Pulse 83   Temp 98.4 F (36.9 C)   Resp 20   SpO2 97%   Visual Acuity Right Eye Distance:   Left Eye Distance:   Bilateral Distance:    Right Eye Near:   Left Eye Near:    Bilateral Near:     Physical Exam Vitals and nursing note reviewed.  Constitutional:      General: She is not in acute distress.    Appearance: Normal appearance. She is not ill-appearing, toxic-appearing or diaphoretic.  Pulmonary:     Effort: Pulmonary effort is normal.  Skin:    General: Skin is warm and dry.      Neurological:     Mental Status: She is alert.  Psychiatric:        Mood and Affect: Mood normal.      UC Treatments / Results  Labs (all labs ordered are listed, but only abnormal results are displayed) Labs Reviewed - No data to display  EKG   Radiology No results found.  Procedures Procedures (including critical care time)  Medications Ordered in UC Medications - No data to display  Initial Impression / Assessment and Plan / UC Course  I have reviewed the triage vital signs and the nursing notes.  Pertinent labs & imaging results that were available during my care of the patient were reviewed by me and considered in my medical decision making (see chart for details).     Pilonidal sinus-will go ahead and cover for infection at this time based on the drainage and erythema.  Patient has an  appointment Monday to see her primary care doctor.  Recommend follow-up as planned.  May need referral to surgeon. Final Clinical Impressions(s) / UC Diagnoses   Final diagnoses:  Pilonidal sinus     Discharge Instructions      Take the antibiotics as prescribed.  Follow-up with your doctor on Monday as planned.     ED Prescriptions     Medication Sig Dispense Auth. Provider   amoxicillin -clavulanate (AUGMENTIN ) 875-125 MG tablet Take 1 tablet by mouth every 12 (twelve) hours. 14 tablet Kimberly Coye A, FNP      I have reviewed the PDMP during this encounter.   Adah Wilbert LABOR, FNP 06/01/24 1931

## 2024-06-01 NOTE — ED Triage Notes (Signed)
 Patient states dx with pilonidal sinus (not cyst) when 58 years old. Had surgery. Reports pain and drainage started approx 2 weeks ago. States area to lower back. Concerned for infection. Appt to see pcp on Monday. Would like antibiotic before then.

## 2024-06-03 NOTE — Progress Notes (Deleted)
 Ellouise Console, PA-C [address] [place], [place]  [address] Phone: [phone]   Primary Care Physician: Jackolyn Darice BROCKS, FNP  Primary Gastroenterologist:  Ellouise Console, PA-C / ***  Chief Complaint: Follow-up GERD and dysphagia       HPI:   Discussed the use of AI scribe software for clinical note transcription with the patient, who gave verbal consent to proceed.  History of Present Illness      Current Outpatient Medications  Medication Sig Dispense Refill   amoxicillin -clavulanate (AUGMENTIN ) 875-125 MG tablet Take 1 tablet by mouth every 12 (twelve) hours. 14 tablet 0   buPROPion  (WELLBUTRIN  XL) 300 MG 24 hr tablet Take 300 mg by mouth daily.     Cyanocobalamin  (VITAMIN B-12 PO) Take 1 tablet by mouth daily.     FIBER PO Take 5 each by mouth at bedtime. OTC fiber gummy     gabapentin  (NEURONTIN ) 300 MG capsule Take 1 capsule (300 mg total) by mouth 3 (three) times daily. 90 capsule 0   levothyroxine  (SYNTHROID ) 100 MCG tablet Take 100 mcg by mouth at bedtime.     methocarbamol  (ROBAXIN ) 500 MG tablet Take 1 tablet (500 mg total) by mouth every 6 (six) hours as needed for muscle spasms. 40 tablet 0   oxyCODONE -acetaminophen  (PERCOCET) 10-325 MG tablet Take 1 tablet by mouth every 4 (four) hours as needed for pain. 30 tablet 0   pantoprazole  (PROTONIX ) 40 MG tablet Take 1 tablet (40 mg total) by mouth 2 (two) times daily before a meal. (Patient taking differently: Take 40 mg by mouth 2 (two) times daily.) 60 tablet 2   venlafaxine  XR (EFFEXOR -XR) 150 MG 24 hr capsule Take 150 mg by mouth daily. with food     venlafaxine  XR (EFFEXOR -XR) 75 MG 24 hr capsule Take 75 mg by mouth daily.     No current facility-administered medications for this visit.    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   bladder tack     GASTRIC ROUX-EN-Y N/A 09/08/2020   Procedure: LAPAROSCOPIC ROUX-EN-Y GASTRIC BYPASS WITH UPPER ENDOSCOPY, CONVERSION FROM LAPAROSCOPIC SLEEVE GASTECTOMY;  Surgeon: Gladis Cough, MD;  Location: [facility];  Service: General;  Laterality: N/A;  3.5 HOURS TOTAL PLEASE   HIATAL HERNIA REPAIR N/A 09/08/2020   Procedure: HERNIA REPAIR HIATAL;  Surgeon: Gladis Cough, MD;  Location: [facility];  Service: General;  Laterality: N/A;   LAPAROSCOPIC GASTRIC SLEEVE RESECTION N/A 06/09/2015   Procedure: LAPAROSCOPIC GASTRIC SLEEVE RESECTION;  Surgeon: Cough Gladis, MD;  Location: [facility];  Service: General;  Laterality: N/A;   NECK  SURGERY     Cervial fusion '12-[facility] - Dr. Louis   SINUSOTOMY     THYROIDECTOMY  2019   TUBAL LIGATION     UPPER GI ENDOSCOPY N/A 06/09/2015   Procedure: UPPER GI ENDOSCOPY;  Surgeon: Donnice Lunger, MD;  Location: [facility];  Service: General;  Laterality: N/A;   UPPER GI ENDOSCOPY N/A 09/08/2020   Procedure: UPPER GI ENDOSCOPY;  Surgeon: Lunger Donnice, MD;  Location: [facility];  Service: General;  Laterality: N/A;    Review of Systems:    All systems reviewed and negative except where noted in HPI.    Physical Exam:  There were no vitals taken for this visit. No LMP recorded. Patient has had a  hysterectomy.  General: Well-nourished, well-developed in no acute distress.  Lungs: Clear to auscultation bilaterally. Non-labored. Heart: Regular rate and rhythm, no murmurs rubs or gallops.  Abdomen: Bowel sounds are normal; Abdomen is Soft; No hepatosplenomegaly, masses or hernias;  No Abdominal Tenderness; No guarding or rebound tenderness. Neuro: Alert and oriented x 3.  Grossly intact.  Psych: Alert and cooperative, normal mood and affect.   Imaging Studies: DG Chest 2 View Result Date: 05/14/2024 EXAM: 2 VIEW(S) XRAY OF THE CHEST 05/14/2024 09:55:00 AM COMPARISON: 05/03/2024. CLINICAL HISTORY: Right pleural effusion. FINDINGS: LUNGS AND PLEURA: Paracentral scattered bilateral pulmonary nodules are present as on prior exams. These measure up to about 1 cm in diameter. Stable blunting of the right lateral costophrenic angle primarily from scarring. No significant blunting of the posterior costophrenic angles. No pneumothorax. HEART AND MEDIASTINUM: No acute abnormality of the cardiac and mediastinal silhouettes. BONES AND SOFT TISSUES: Stable postoperative findings in the cervical and thoracic spine. Reduced subcutaneous emphysema on the right. Stable lucency associated with right 6th rib harvesting. Right lateral 7th rib fracture noted. IMPRESSION: 1. Stable blunting of the right lateral costophrenic angle from scarring. 2. Stable postoperative lucency from right 6th rib harvesting and right lateral 7th rib fracture. 3. Stable bilateral pulmonary nodules. Electronically signed by: Ryan Salvage MD 05/14/2024 10:44 AM EST RP Workstation: [id number]    Labs: CBC    Component Value Date/Time   WBC 6.7 05/03/2024 0204   RBC 3.05 (L) 05/03/2024 0204   HGB 8.9 (L) 05/03/2024 0204   HCT 27.9 (L) 05/03/2024 0204   PLT 477 (H) 05/03/2024 0204   MCV 91.5 05/03/2024 0204   MCH 29.2 05/03/2024 0204   MCHC 31.9 05/03/2024 0204   RDW 13.3 05/03/2024 0204   LYMPHSABS 0.6 (L) 04/28/2024 1221    MONOABS 0.7 04/28/2024 1221   EOSABS 0.2 04/28/2024 1221   BASOSABS 0.0 04/28/2024 1221    CMP     Component Value Date/Time   NA 141 05/03/2024 0204   K 3.7 05/03/2024 0204   CL 105 05/03/2024 0204   CO2 28 05/03/2024 0204   GLUCOSE 91 05/03/2024 0204   BUN 16 05/03/2024 0204   CREATININE 0.63 05/03/2024 0204   CALCIUM 7.9 (L) 05/03/2024 0204   PROT 5.5 (L) 04/30/2024 0604   ALBUMIN  2.2 (L) 04/30/2024 0604   AST 16 04/30/2024 0604   ALT 12 04/30/2024 0604   ALKPHOS 45 04/30/2024 0604   BILITOT 0.3 04/30/2024 0604   GFRNONAA >60 05/03/2024 0204   GFRAA >60 06/09/2015 1336       Assessment and Plan:   TUESDAY TERLECKI is a 58 y.o. y/o female ***  Assessment and Plan Assessment & Plan       Ellouise Console, PA-C  Follow up ***

## 2024-06-04 ENCOUNTER — Ambulatory Visit: Admitting: Physician Assistant

## 2024-06-04 NOTE — Progress Notes (Signed)
 Tammy Wilson is a 58 y.o.female.  Subjective:   This patient comes in today she was seen at urgent care on December 5 for a pilonidal infection she was given Augmentin  and it does appear that she has actually had surgery for this.  Notes indicated she has had a diagnosis since she was 58 years old, she had pain and drainage about 2 weeks ago and she started to notice that she was having an increased amount of pain.  Of note she was seen by a neurosurgeon with thoracic myelopathy and ended up having a thoracic fusion on the right 6 -7-8.  She then had to go back to the hospital for a pleural effusion, she developed right sided chest pain shortness of breath the CTA ruled out PE but she had a loculated right pleural effusion and subcutaneous emphysema with a small right basilar pneumothorax she ended up having to undergo a chest tube and she apparently was in the hospital for a 2-week time which included her first hospitalization on October 27 and her second 1 on November 1 with a discharge date having been on November 6 from the pleural effusion.  There was no mention of any buttock pain when she was in the hospital.  She called on December 1 for an appointment due to the question of whether she had a pilonidal cyst she had been taking pictures of her buttock region and she thought that she saw a hole with tunneling.  She ultimately went on 5 December to urgent care, with the concern that she may need to be placed on antibiotics before the appointment that she had today.  According to her and the notes I do not think that the area was examined they just went by her statement and placed her on Augmentin  and she does feel that it has improved and she described drainage but the area that she showed me today has a lot of slough and an open area that looks almost like a decubitus is at the base of where her previous pilonidal surgery was almost as if the skin opened up below that which with the significant weight  loss that she has undergone I suspect that is the case.  I reviewed with her I think based upon what this looks like she is going to have to be seen by a surgeon it almost has the appearance of either needing a skin graft to this area or looking to see a little bit more in depth by them removing some of the slough which I was not going to do in the office we do not have the equipment to do that nor do I think it is feasible as I am not a careers adviser.  I did review with her to stay on the antibiotic and the referral that we placed they indicated that would put her on an urgent basis to come in.  She has not had fever or chills with this she states if she sits upright then she does not have as much pain as if she were leaning backwards  Plan:  I reviewed with her I would try to keep the area covered she could even put Vaseline on this from a moisture standpoint she had been covering it with a maxi pad just to collect the drainage, the area is not wet in terms of drainage at this time the area around the open wound is not red but her skin is very fragile and thin and I talked with  her I think a surgeon does need to see this and/or wound care we were able to get her in with the surgeon I almost think that a skin graft is needed to this area and I am not sure how long the area has looked like it has she describes it as initially when she was 58 years old the wound was left open to heal so again as I am not seeing this I do not know what her baseline area looked like if there could have been a divot or a dip in that area that did have skin over it and her most recent hospitalization where she was in the bed for at least a 10-day time.  Is not where this started and again with her significant weight loss the concern is with this area open and that it could get deeper and that is not ideally what she needs.  Urgent referral was put to surgery in [facility] at her request and hopefully they will be able to get her and she  should continue with the oral antibiotics that she is on _________________________________    Diagnoses this Encounter 1. Pilonidal sinus      2. Fragile skin        Chief Complaint  Patient presents with   UC follow-up    Was seen at [facility] on 12/5 for pilonidal sinus. Prescribed Augmentin .    Physical Exam Vitals reviewed.  Constitutional:      Comments: She has lost quite a bit of weight and with that she has looser skin with no muscle mass and the skin on her buttock area is thinner and very fragile  HENT:     Head: Normocephalic.     Right Ear: Tympanic membrane normal.     Left Ear: Tympanic membrane normal.     Nose: No congestion or rhinorrhea.     Mouth/Throat:     Mouth: Mucous membranes are moist.  Eyes:     Pupils: Pupils are equal, round, and reactive to light.  Cardiovascular:     Rate and Rhythm: Normal rate and regular rhythm.     Pulses: Normal pulses.     Heart sounds: Normal heart sounds.  Pulmonary:     Effort: Pulmonary effort is normal.     Breath sounds: Normal breath sounds.  Abdominal:     Palpations: Abdomen is soft.  Musculoskeletal:        General: Normal range of motion.     Cervical back: Normal range of motion.  Skin:    General: Skin is warm and dry.     Capillary Refill: Capillary refill takes less than 2 seconds.     Comments: See description under constitutional, there is an open area with slough that is approximately 1-1/2 cm ovoid in length and width there is no significant drainage and or odor she has been maintained on the antibiotic since the fifth she thinks that she had 14 days that were sent in unable to detect that there is an open hole no bleeding  Neurological:     Mental Status: She is alert and oriented to person, place, and time.  Psychiatric:        Mood and Affect: Mood normal.     ______________________ Vitals:   06/04/24 1420  BP: 104/70  BP Location: Left arm  Patient Position: Sitting  Pulse: 84  Resp: 16   Temp: 98.6 F (37 C)  TempSrc: Temporal  Weight: 62.1 kg (137 lb)  Height:  1.702 m (5' 7)    Patient's Medications  New Prescriptions   No medications on file  Previous Medications   ACETAMINOPHEN  (TYLENOL ) 325 MG TABLET    Take 650 mg by mouth every 6 (six) hours as needed for mild pain (1-3).   AMOXICILLIN -POT CLAVULANATE (AUGMENTIN ) 875-125 MG PER TABLET    Take 1 tablet by mouth every 12 (twelve) hours.   BUPROPION  (WELLBUTRIN  XL) 300 MG 24 HR TABLET    Take 1 tablet by mouth once daily   CHLORHEXIDINE  (HIBICLENS ) 4 % EXTERNAL LIQUID    Apply 15 mL topically daily as needed for wound care. Apply 15 ml for 5 days every other week   CYANOCOBALAMIN  (VITAMIN B12) 1,000 MCG TABLET    Take 1,000 mcg by mouth daily.   LEVOTHYROXINE  (SYNTHROID ) 100 MCG TABLET    Take 1 tablet (100 mcg total) by mouth daily.   LORAZEPAM  (ATIVAN ) 0.5 MG TABLET    TAKE 2 TABLETS BY MOUTH EVERY 8 HOURS AS NEEDED FOR ANXIETY   ONDANSETRON  (ZOFRAN ) 4 MG TABLET    Take 4 mg by mouth every 6 (six) hours as needed for nausea or vomiting.   OXYCODONE  (ROXICODONE ) 10 MG TAB    Take by mouth 3 (three) times a day as needed for moderate pain (4-6).   PANTOPRAZOLE  (PROTONIX ) 40 MG EC TABLET    Take 1 tablet (40 mg total) by mouth daily.   PLECANATIDE (TRULANCE) 3 MG TAB    Take 1 tablet by mouth daily.   PROMETHAZINE  (PHENERGAN ) 12.5 MG TABLET    Take 12.5 mg by mouth every 8 (eight) hours as needed for vomiting or nausea.   TRAZODONE  (DESYREL ) 25 MG TAB SPLIT TABLET    Take 50 mg by mouth at bedtime.   VENLAFAXINE  (EFFEXOR  XR) 150 MG 24 HR CAPSULE    Take 1 capsule (150 mg total) by mouth daily. Take 75mg  with 150mg  to make 225mg .   VENLAFAXINE  (EFFEXOR  XR) 75 MG 24 HR CAPSULE    Take 1 capsule (75 mg total) by mouth daily. Take 150mg  along with 75 mg for total of 225.  Modified Medications   No medications on file  Discontinued Medications   GABAPENTIN  (NEURONTIN ) 300 MG CAPSULE    Take 300 mg by mouth 3 (three)  times a day.   METHOCARBAMOL  (ROBAXIN ) 500 MG TABLET    Take 500 mg by mouth every 6 (six) hours as needed for muscle spasms.   MUPIROCIN  (BACTROBAN ) 2 % OINTMENT    Apply 1 Application topically 3 (three) times a day. 1 application bid 2 60 doses as directed 2 x daily x 5 days every other week x 6 weeks.   No orders of the defined types were placed in this encounter.  Problem List[1] Medical History[2] Surgical History[3] Family History[4] Social History   Socioeconomic History   Marital status: Married    Spouse name: Not on file   Number of children: Not on file   Years of education: Not on file   Highest education level: Not on file  Occupational History   Not on file  Tobacco Use   Smoking status: Never    Passive exposure: Never   Smokeless tobacco: Never  Vaping Use   Vaping status: Never Used  Substance and Sexual Activity   Alcohol use: No   Drug use: No   Sexual activity: Yes    Partners: Male    Birth control/protection: Surgical  Other Topics Concern   Not  on file  Social History Narrative   Not on file   Social Drivers of Health   Living Situation: Low Risk (06/04/2024)   Living Situation    What is your living situation today?: I have a steady place to live    Think about the place you live. Do you have problems with any of the following? Choose all that apply:: None/None on this list  Food Insecurity: Low Risk (06/04/2024)   Food vital sign    Within the past 12 months, you worried that your food would run out before you got money to buy more: Never true    Within the past 12 months, the food you bought just didn't last and you didn't have money to get more: Never true  Transportation Needs: No Transportation Needs (06/04/2024)   Transportation    In the past 12 months, has lack of reliable transportation kept you from medical appointments, meetings, work or from getting things needed for daily living? : No  Utilities: Low Risk  (06/04/2024)   Utilities    In the past 12 months has the electric, gas, oil, or water  company threatened to shut off services in your home? : No  Safety: Low Risk (06/04/2024)   Safety    How often does anyone, including family and friends, physically hurt you?: Never    How often does anyone, including family and friends, insult or talk down to you?: Never    How often does anyone, including family and friends, threaten you with harm?: Never    How often does anyone, including family and friends, scream or curse at you?: Never  Alcohol Screening: Not on file  Tobacco Use: Low Risk (06/04/2024)   Patient History    Smoking Tobacco Use: Never    Smokeless Tobacco Use: Never    Passive Exposure: Never  Depression: Not At Risk (03/05/2024)   PHQ-2    PHQ-2 Score: 0  Social Connections: Not on file  Financial Resource Strain: Not on file   Allergies[5]  Review of Systems  Constitutional:  Positive for fatigue. Negative for chills and fever.  HENT:  Negative for congestion, nosebleeds, postnasal drip, rhinorrhea, sinus pressure and sore throat.   Respiratory:  Negative for chest tightness and shortness of breath.   Cardiovascular:  Negative for chest pain and palpitations.  Gastrointestinal:  Negative for blood in stool, constipation and nausea.  Genitourinary:  Negative for dysuria, flank pain, frequency and urgency.  Musculoskeletal:  Negative for arthralgias.  Skin:  Positive for wound. Negative for color change.  Neurological:  Negative for dizziness and light-headedness.  Hematological:  Negative for adenopathy.  Psychiatric/Behavioral:  Negative for agitation.           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   GERD (gastroesophageal reflux disease)    Hematuria 09/17/2016   Hydronephrosis, bilateral 06/03/2016  Hypocalcemia 08/18/2016   Kidney stone 03/14/2018   Added automatically from request for surgery [id number]   Kidney stones    Left flank pain 07/27/2018   Added automatically from request for surgery [id number]   Left ureteral stone 05/15/2019   Added automatically from request for surgery [id number]   Lytic bone lesion of hip 08/08/2016   Mediastinal adenopathy 08/27/2016   Nephrolithiasis    Nodule of left lobe of thyroid  gland 08/04/2016   Other microscopic hematuria 06/22/2016   Postoperative hypothyroidism 08/18/2016   Pulmonary nodules 08/08/2016   S/P laparoscopic sleeve gastrectomy 06/09/2015   Snores    Thyroid  disease   [3] Past Surgical History: Procedure Laterality Date   BLADDER NECK SUSPENSION  1998 and 2016   Procedure: BLADDER NECK SLING   CYSTOSCOPY W/ LASER LITHOTRIPSY Bilateral 07/28/2018   Procedure: URETEROSCOPIC LASER LITHOTRIPSY  bilateral retrogrades DJ stent;  Surgeon: Evalene Meribeth Nam, MD;  Location: [facility] MAIN OR;  Service: Urology;  Laterality: Bilateral;   CYSTOSCOPY W/ LASER LITHOTRIPSY Left 05/16/2019   Procedure: cystoscopy, left retrograde pyelogram, ureteroscopy with stone manipulation-laser lithotripsy and placement of ureteral stent;  Surgeon: Layman Lorrene Hurst, MD;  Location: [facility] MAIN OR;  Service: Urology;  Laterality: Left;   CYSTOSCOPY W/ RETROGRADES N/A 10/05/2016   Procedure: CYSTOSCOPY W/ RETROGRADE PYELOGRAM / / LEFT URETEROSCOPY, LEFT STENT PLACEMENT;  Surgeon: Tanda Jama Nicholaus DOUGLAS,  MD;  Location: [facility] OUTPATIENT OR;  Service: Urology;  Laterality: N/A;   CYSTOSCOPY W/ URETEROSCOPY Left 10/19/2016   Procedure: CYSTOSCOPY W/ URETEROSCOPY;  Surgeon: Tanda Jama Nicholaus DOUGLAS, MD;  Location: [facility] MAIN OR;  Service: Urology;  Laterality: Left;   EXTRACORPOREAL SHOCK WAVE LITHOTRIPSY Left 03/27/2018   Procedure: ESWL;  Surgeon: Deward Elsie Stalling, MD;  Location: [facility] LITHO;  Service: Urology;  Laterality: Left;   EXTRACORPOREAL SHOCK WAVE LITHOTRIPSY Left 04/10/2018   Procedure: ESWL;  Surgeon: Deward Elsie Stalling, MD;  Location: [facility] LITHO;  Service: Urology;  Laterality: Left;   EXTRACORPOREAL SHOCK WAVE LITHOTRIPSY Left 02/19/2019   Procedure: ESWL;  Surgeon: Deward Elsie Stalling, MD;  Location: [facility] LITHO;  Service: Urology;  Laterality: Left;   EXTRACORPOREAL SHOCK WAVE LITHOTRIPSY Left 07/25/2023   Left renal calculus: ESWL   [place]   HYSTERECTOMY      Procedure: HYSTERECTOMY   LITHOTRIPSY Left 11/07/2023   Left Renal Calculus   NECK SURGERY  2013   Procedure: NECK SURGERY   SLEEVE GASTROPLASTY     Procedure: LAPAROSCOPIC SLEEVE GASTRECTOMY   THYROIDECTOMY N/A 08/17/2016   Procedure: TOTAL THYROIDECTOMY;  Surgeon: Lonni DELENA Archer, MD;  Location: [facility] MAIN OR;  Service: ENT;  Laterality: N/A;   TOTAL VAGINAL HYSTERECTOMY     WISDOM TOOTH EXTRACTION     Procedure: WISDOM TOOTH EXTRACTION  [4] Family History Problem Relation Name Age of Onset   Breast cancer Paternal Aunt     Brain cancer Paternal Aunt     Breast cancer Paternal Aunt     Breast cancer Paternal Aunt    [5] Allergies Allergen Reactions   Ciprofloxacin Bronchospasm, Myalgias and Other (See Comments)   Hydromorphone Other (See Comments)   Sulfa (Sulfonamide Antibiotics) Other (See Comments)   Hydromorphone Hcl Palpitations   Sulfamethoxazole-Trimethoprim GI Intolerance and Rash

## 2024-06-08 ENCOUNTER — Other Ambulatory Visit: Payer: Self-pay | Admitting: Surgery

## 2024-06-08 DIAGNOSIS — J90 Pleural effusion, not elsewhere classified: Secondary | ICD-10-CM

## 2024-06-11 ENCOUNTER — Inpatient Hospital Stay: Admission: RE | Admit: 2024-06-11 | Discharge: 2024-06-11 | Attending: Cardiology

## 2024-06-11 ENCOUNTER — Ambulatory Visit

## 2024-06-11 VITALS — BP 91/60 | HR 75 | Resp 18 | Ht 67.0 in | Wt 136.0 lb

## 2024-06-11 DIAGNOSIS — J90 Pleural effusion, not elsewhere classified: Secondary | ICD-10-CM | POA: Insufficient documentation

## 2024-06-11 DIAGNOSIS — R918 Other nonspecific abnormal finding of lung field: Secondary | ICD-10-CM | POA: Diagnosis not present

## 2024-06-11 DIAGNOSIS — S2231XA Fracture of one rib, right side, initial encounter for closed fracture: Secondary | ICD-10-CM | POA: Diagnosis not present

## 2024-06-11 DIAGNOSIS — J939 Pneumothorax, unspecified: Secondary | ICD-10-CM | POA: Diagnosis not present

## 2024-06-11 NOTE — Progress Notes (Signed)
 [address] [place] [address]             [phone]       HPI:  Patient returns for routine follow-up having undergone right sided chest tube placement by interventional radiology for post operative right hydropneumothorax on 04/29/2024. She underwent thoracic fusion with rib harvest with neurosurgery on 04/23/2024 by Dr. Louis.   She presents today for continued follow up. She reports that her pain is well controlled.  She does have some numbness along her incision site which is not requiring any medications. She denies chest pain, shortness of breath and coughing.    Allergies as of 06/11/2024       Reactions   Cipro [ciprofloxacin Hcl] Other (See Comments)   Bronchospasm    Dilaudid [hydromorphone Hcl] Palpitations   Sulfa Antibiotics Itching, Rash        Medication List        Accurate as of June 11, 2024  8:48 PM. If you have any questions, ask your nurse or doctor.          amoxicillin -clavulanate 875-125 MG tablet Commonly known as: AUGMENTIN  Take 1 tablet by mouth every 12 (twelve) hours.   buPROPion  300 MG 24 hr tablet Commonly known as: WELLBUTRIN  XL Take 300 mg by mouth daily.   FIBER PO Take 5 each by mouth at bedtime. OTC fiber gummy   gabapentin  300 MG capsule Commonly known as: NEURONTIN  Take 1 capsule (300 mg total) by mouth 3 (three) times daily.   levothyroxine  100 MCG tablet Commonly known as: SYNTHROID  Take 100 mcg by mouth at bedtime.   methocarbamol  500 MG tablet Commonly known as: ROBAXIN  Take 1 tablet (500 mg total) by mouth every 6 (six) hours as needed for muscle spasms.   oxyCODONE -acetaminophen  10-325 MG tablet Commonly known as: Percocet Take 1 tablet by mouth every 4 (four) hours as needed for pain.   pantoprazole  40 MG tablet Commonly known as: PROTONIX  Take 1 tablet (40 mg total) by mouth 2 (two) times daily before a meal. What changed: when to take this   venlafaxine  XR 150 MG 24  hr capsule Commonly known as: EFFEXOR -XR Take 150 mg by mouth daily. with food   venlafaxine  XR 75 MG 24 hr capsule Commonly known as: EFFEXOR -XR Take 75 mg by mouth daily.   VITAMIN B-12 PO Take 1 tablet by mouth daily.         ROS Review of Systems  Constitutional:  Negative for chills and fever.  Respiratory:  Negative for cough and shortness of breath.   Cardiovascular:  Negative for chest pain and leg swelling.  Musculoskeletal:  Positive for back pain.      BP 91/60 (BP Location: Left Arm)   Pulse 75   Resp 18   Ht 5' 7 (1.702 m)   Wt 136 lb (61.7 kg)   SpO2 99%   BMI 21.30 kg/m   Physical Exam Constitutional:      Appearance: Normal appearance.  HENT:     Head: Normocephalic and atraumatic.  Musculoskeletal:     Cervical back: Normal range of motion.  Skin:    General: Skin is warm and dry.  Neurological:     General: No focal deficit present.     Mental Status: She is alert.       Imaging: EXAM: 2 VIEW(S) XRAY OF THE CHEST 06/11/2024 01:46:35 PM   COMPARISON: 05/14/2024   CLINICAL  HISTORY: Hydropneumothorax   FINDINGS:   LUNGS AND PLEURA: Redemonstration of multiple small bilateral pulmonary nodules. Small right pleural effusion.   HEART AND MEDIASTINUM: No acute abnormality of the cardiac and mediastinal silhouettes.   BONES AND SOFT TISSUES: Surgical clips in lower neck. Partially seen lower cervical spine ACDF hardware. Mid thoracic spinal fixation hardware noted. Stable postoperative lucency from right 6th rib harvesting and right lateral 7th rib fracture. No acute osseous abnormality.   IMPRESSION: 1. Residual small right pleural effusion.  Pneumothorax . 2. Multiple small bilateral pulmonary nodules, unchanged from prior and not specifically explained by the current process.   Electronically signed by: Waddell Calk MD 06/11/2024 02:09 PM EST RP Workstation: [id number]  Assessment/Plan:  Pleural effusion on  right -We reviewed today's chest x ray which showed small stable right pleural effusion -She continues to be asymptomatic, denies chest pain, shortness of breath, dry cough and lower leg swelling -She is to continue to stay active and increase as tolerated and based of guidelines from neurosurgery -She is to continue follow up with neurosurgery as scheduled -Follow up with [facility] as needed  Tammy CHRISTELLA Rough, PA-C 8:48 PM 06/11/2024

## 2024-06-11 NOTE — Patient Instructions (Signed)
-  Follow up with [facility] as needed

## 2024-06-21 ENCOUNTER — Other Ambulatory Visit: Payer: Self-pay | Admitting: Physician Assistant

## 2024-06-27 ENCOUNTER — Encounter (HOSPITAL_BASED_OUTPATIENT_CLINIC_OR_DEPARTMENT_OTHER): Payer: Self-pay | Admitting: Emergency Medicine

## 2024-06-27 ENCOUNTER — Emergency Department (HOSPITAL_BASED_OUTPATIENT_CLINIC_OR_DEPARTMENT_OTHER)
Admission: EM | Admit: 2024-06-27 | Discharge: 2024-06-28 | Disposition: A | Discharge status: Home / Self Care | Source: Home / Self Care | Attending: Emergency Medicine | Admitting: Emergency Medicine

## 2024-06-27 ENCOUNTER — Other Ambulatory Visit: Payer: Self-pay

## 2024-06-27 ENCOUNTER — Emergency Department (HOSPITAL_BASED_OUTPATIENT_CLINIC_OR_DEPARTMENT_OTHER)

## 2024-06-27 DIAGNOSIS — M940 Chondrocostal junction syndrome [Tietze]: Secondary | ICD-10-CM | POA: Insufficient documentation

## 2024-06-27 DIAGNOSIS — R0600 Dyspnea, unspecified: Secondary | ICD-10-CM

## 2024-06-27 DIAGNOSIS — E039 Hypothyroidism, unspecified: Secondary | ICD-10-CM | POA: Diagnosis not present

## 2024-06-27 DIAGNOSIS — Z79899 Other long term (current) drug therapy: Secondary | ICD-10-CM | POA: Diagnosis not present

## 2024-06-27 DIAGNOSIS — Z85828 Personal history of other malignant neoplasm of skin: Secondary | ICD-10-CM | POA: Diagnosis not present

## 2024-06-27 DIAGNOSIS — J90 Pleural effusion, not elsewhere classified: Secondary | ICD-10-CM | POA: Diagnosis not present

## 2024-06-27 DIAGNOSIS — R0602 Shortness of breath: Secondary | ICD-10-CM | POA: Diagnosis present

## 2024-06-27 LAB — COMPREHENSIVE METABOLIC PANEL WITH GFR
ALT: 24 U/L (ref 0–44)
AST: 41 U/L (ref 15–41)
Albumin: 4.2 g/dL (ref 3.5–5.0)
Alkaline Phosphatase: 77 U/L (ref 38–126)
Anion gap: 13 (ref 5–15)
BUN: 25 mg/dL — ABNORMAL HIGH (ref 6–20)
CO2: 24 mmol/L (ref 22–32)
Calcium: 8.8 mg/dL — ABNORMAL LOW (ref 8.9–10.3)
Chloride: 105 mmol/L (ref 98–111)
Creatinine, Ser: 0.75 mg/dL (ref 0.44–1.00)
GFR, Estimated: >60 mL/min
Glucose, Bld: 76 mg/dL (ref 70–99)
Potassium: 4.2 mmol/L (ref 3.5–5.1)
Sodium: 142 mmol/L (ref 135–145)
Total Bilirubin: 0.2 mg/dL (ref 0.0–1.2)
Total Protein: 7.4 g/dL (ref 6.5–8.1)

## 2024-06-27 LAB — PRO BRAIN NATRIURETIC PEPTIDE: Pro Brain Natriuretic Peptide: 70.4 pg/mL

## 2024-06-27 LAB — CBC WITH DIFFERENTIAL/PLATELET
Abs Immature Granulocytes: 0.03 K/uL (ref 0.00–0.07)
Basophils Absolute: 0.1 K/uL (ref 0.0–0.1)
Basophils Relative: 1 %
Eosinophils Absolute: 0.2 K/uL (ref 0.0–0.5)
Eosinophils Relative: 3 %
HCT: 36.4 % (ref 36.0–46.0)
Hemoglobin: 11.5 g/dL — ABNORMAL LOW (ref 12.0–15.0)
Immature Granulocytes: 0 %
Lymphocytes Relative: 21 %
Lymphs Abs: 1.4 K/uL (ref 0.7–4.0)
MCH: 28 pg (ref 26.0–34.0)
MCHC: 31.6 g/dL (ref 30.0–36.0)
MCV: 88.8 fL (ref 80.0–100.0)
Monocytes Absolute: 0.7 K/uL (ref 0.1–1.0)
Monocytes Relative: 11 %
Neutro Abs: 4.3 K/uL (ref 1.7–7.7)
Neutrophils Relative %: 64 %
Platelets: 312 K/uL (ref 150–400)
RBC: 4.1 MIL/uL (ref 3.87–5.11)
RDW: 14 % (ref 11.5–15.5)
WBC: 6.7 K/uL (ref 4.0–10.5)
nRBC: 0 % (ref 0.0–0.2)

## 2024-06-27 LAB — TROPONIN T, HIGH SENSITIVITY: Troponin T High Sensitivity: <15 ng/L (ref 0–19)

## 2024-06-27 LAB — D-DIMER, QUANTITATIVE: D-Dimer, Quant: 0.33 ug{FEU}/mL (ref 0.00–0.50)

## 2024-06-27 NOTE — ED Provider Notes (Signed)
 Care of patient received from prior provider at 10:59 PM, please see their note for complete H/P and care plan.  Received handoff per ED course.  Clinical Course as of 06/27/24 2259  Wed Jun 27, 2024  2258 Stable [name] with Chest pain workup.   [CC]    Clinical Course User Index [CC] Jerral Meth, MD    Reassessment: On reassessment, pain is resolved.  She is in no acute distress.  Pain is mostly present around motion.  Appears consistent with costochondritis after recent surgical procedure though still nonspecific.  X-ray shows no fluid collection, EKG/serial troponin/D-dimer all negative. Patient improved with NSAIDs and feels comfortable with trial of NSAIDs follow-up with PCP. Recommended calling her cardiothoracic surgeon for expedited follow-up as well.     Jerral Meth, MD 06/28/24 [phone]

## 2024-06-27 NOTE — ED Triage Notes (Signed)
 Pt c/o RT side CP with radiation to back x 2d; recent RT side pleural effusion

## 2024-06-27 NOTE — ED Provider Notes (Signed)
 " [place] [facility] Provider Note  CSN: [id number] Arrival date & time: 06/27/24 2156  Chief Complaint(s) Chest Pain  HPI Tammy Wilson is a 58 y.o. female who is here today for shortness of breath.  Patient has a history of right sided pleural effusion.  She is here today for shortness of breath and chest pain over the last 2 days.  Patient had a pleural effusion on the right side which required chest tube drainage.  She states she has felt more short of breath over the last few days.   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   Sarcoidosis    Patient Active Problem List   Diagnosis Date Noted   Pleural effusion on right 04/28/2024   Acute anemia 04/28/2024   Thoracic myelopathy 04/23/2024   Medication management 10/24/2023   PICC (peripherally inserted central catheter) removal 10/24/2023   Infection due to ESBL-producing Escherichia coli 10/24/2023   Pyelonephritis 10/02/2023   Hypokalemia 10/02/2023   Prolonged QT interval 10/02/2023   Mild protein malnutrition 10/02/2023   Hyperglycemia 10/02/2023   Hypomagnesemia 10/02/2023   Malaise and fatigue 02/22/2022   B12 deficiency 09/09/2021   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   Gastric bypass status for obesity 09/08/2020   S/P gastric bypass 09/08/2020   Mediastinal adenopathy 08/27/2016   Anxiety 08/18/2016   GERD (gastroesophageal reflux disease) 08/18/2016   Hypocalcemia 08/18/2016   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 Authorizing Provider  amoxicillin -clavulanate (AUGMENTIN ) 875-125 MG tablet Take 1 tablet by mouth every 12 (twelve) hours. 06/01/24   Adah Wilbert LABOR, FNP  buPROPion  (WELLBUTRIN  XL) 300 MG 24 hr tablet Take 300 mg by mouth daily. 08/06/20   [provider]  Cyanocobalamin  (VITAMIN B-12 PO) Take 1 tablet by mouth daily.    [provider]  FIBER PO Take 5 each by mouth at bedtime. OTC fiber gummy    [provider]  gabapentin  (NEURONTIN ) 300 MG capsule Take 1 capsule (300 mg total) by mouth 3 (three) times daily. 04/26/24   Louis Shove, MD  levothyroxine  (SYNTHROID ) 100 MCG tablet Take 100 mcg by mouth at bedtime. 04/05/24   [provider]  methocarbamol  (ROBAXIN ) 500 MG tablet Take 1 tablet (500 mg total) by mouth every 6 (six) hours as needed for muscle spasms. 05/03/24   Danford, Lonni SQUIBB, MD  oxyCODONE -acetaminophen  (PERCOCET) 10-325 MG tablet Take 1 tablet by mouth every 4 (four) hours as needed for pain. 05/03/24 05/03/25  DanfordLonni SQUIBB, MD  pantoprazole  (PROTONIX ) 40 MG tablet TAKE 1 TABLET BY MOUTH TWICE DAILY BEFORE A MEAL 06/22/24  [place], PA-C  venlafaxine  XR (EFFEXOR -XR) 150 MG 24 hr capsule Take 150 mg by mouth daily. with food 08/06/20   [provider]  venlafaxine  XR (EFFEXOR -XR) 75 MG 24 hr capsule Take 75 mg by mouth daily. 04/12/24   [provider]                                                                                                                                    Past  Surgical History Past Surgical History:  Procedure Laterality Date   ABDOMINAL HYSTERECTOMY     laparoscopic   BLADDER SUSPENSION     done with hysterectomy, 2'16 sling redone.   bladder tack     GASTRIC ROUX-EN-Y N/A 09/08/2020   Procedure: LAPAROSCOPIC ROUX-EN-Y GASTRIC BYPASS WITH UPPER ENDOSCOPY, CONVERSION FROM LAPAROSCOPIC SLEEVE GASTECTOMY;  Surgeon: Gladis Cough, MD;  Location: [facility];  Service: General;  Laterality: N/A;  3.5 HOURS TOTAL PLEASE   HIATAL HERNIA REPAIR N/A 09/08/2020   Procedure: HERNIA REPAIR HIATAL;  Surgeon: Gladis Cough, MD;  Location: [facility];  Service: General;  Laterality: N/A;   LAPAROSCOPIC GASTRIC SLEEVE RESECTION N/A 06/09/2015   Procedure: LAPAROSCOPIC GASTRIC SLEEVE RESECTION;  Surgeon: Cough Gladis, MD;  Location: [facility];  Service: General;  Laterality: N/A;   NECK SURGERY     Cervial fusion '12-[facility] - Dr. Louis   SINUSOTOMY     THYROIDECTOMY  2019   TUBAL LIGATION     UPPER GI ENDOSCOPY N/A 06/09/2015   Procedure: UPPER GI ENDOSCOPY;  Surgeon: Cough Gladis, MD;  Location: [facility];  Service: General;  Laterality: N/A;   UPPER GI ENDOSCOPY N/A 09/08/2020   Procedure: UPPER GI ENDOSCOPY;  Surgeon: Gladis Cough, MD;  Location: [facility];  Service: General;  Laterality: N/A;   Family History Family History  Problem Relation Age of Onset   Heart disease Mother    Rheum arthritis Mother    Colon cancer Neg Hx    Esophageal cancer Neg Hx    Stomach cancer Neg Hx    Rectal cancer Neg Hx     Social History Social History[1] Allergies Cipro [ciprofloxacin hcl], Dilaudid [hydromorphone hcl], and Sulfa antibiotics  Review of Systems Review of Systems  Physical Exam Vital Signs  I have reviewed the triage vital signs BP 136/70 (BP Location: Right Arm)   Pulse 72   Temp 97.7 F (36.5 C)   Resp 20   Ht 5' 7 (1.702 m)   Wt 61.7 kg   SpO2 100%   BMI 21.30 kg/m   Physical Exam Vitals and nursing note reviewed.  Neurological:      Mental Status: She is alert.     ED Results and Treatments Labs (all labs ordered are listed, but only abnormal results are displayed) Labs Reviewed  CBC WITH DIFFERENTIAL/PLATELET - Abnormal; Notable for the following components:      Result Value   Hemoglobin 11.5 (*)  All other components within normal limits  COMPREHENSIVE METABOLIC PANEL WITH GFR - Abnormal; Notable for the following components:   BUN 25 (*)    Calcium 8.8 (*)    All other components within normal limits  PRO BRAIN NATRIURETIC PEPTIDE  D-DIMER, QUANTITATIVE  TROPONIN T, HIGH SENSITIVITY                                                                                                                          Radiology No results found.  Pertinent labs & imaging results that were available during my care of the patient were reviewed by me and considered in my medical decision making (see MDM for details).  Medications Ordered in ED Medications - No data to display                                                                                                                                   Procedures Procedures  (including critical care time)  Medical Decision Making / ED Course   This patient presents to the ED for concern of shortness of breath, this involves an extensive number of treatment options, and is a complaint that carries with it a high risk of complications and morbidity.  The differential diagnosis includes pleural effusion, PE, less likely ACS.  MDM: My dependent review the patient's chest x-ray shows no large pleural effusion.  Her EKG, per my independent review shows no evidence of acute ischemia.  Given her shortness of breath, will obtain a D-dimer on the patient.  Troponin testing pending.  Overall on room air, nontachypneic, nontachycardic.  Patient will be signed out to Dr. Jerral pending troponin testing, D-dimer.   Additional history obtained: -Additional history obtained  from husband at bedside -External records from outside source obtained and reviewed including: Chart review including previous notes, labs, imaging, consultation notes   Lab Tests: -I ordered, reviewed, and interpreted labs.   The pertinent results include:   Labs Reviewed  CBC WITH DIFFERENTIAL/PLATELET - Abnormal; Notable for the following components:      Result Value   Hemoglobin 11.5 (*)    All other components within normal limits  COMPREHENSIVE METABOLIC PANEL WITH GFR - Abnormal; Notable for the following components:   BUN 25 (*)    Calcium 8.8 (*)    All other components within normal limits  PRO BRAIN NATRIURETIC PEPTIDE  D-DIMER, QUANTITATIVE  TROPONIN  T, HIGH SENSITIVITY      EKG my independent review of the patient's EKG shows no ST segment depressions or elevations, no T wave inversions, no evidence of acute ischemia.  EKG Interpretation Date/Time:  Wednesday June 27 2024 22:06:51 EST Ventricular Rate:  72 PR Interval:  137 QRS Duration:  97 QT Interval:  407 QTC Calculation: 446 R Axis:   75  Text Interpretation: Sinus rhythm Confirmed by Mannie Pac [phone]) on 06/27/2024 10:56:00 PM         Imaging Studies ordered: I ordered imaging studies including chest x-ray I independently visualized and interpreted imaging. I agree with the radiologist interpretation   Medicines ordered and prescription drug management: No orders of the defined types were placed in this encounter.   -I have reviewed the patients home medicines and have made adjustments as needed   Cardiac Monitoring: The patient was maintained on a cardiac monitor.  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

## 2024-06-28 LAB — TROPONIN T, HIGH SENSITIVITY: Troponin T High Sensitivity: <15 ng/L (ref 0–19)

## 2024-06-28 MED ORDER — CELECOXIB 200 MG PO CAPS: 200.0000 mg | ORAL_CAPSULE | Freq: Two times a day (BID) | ORAL | 0 refills | Status: AC

## 2024-06-28 MED ORDER — KETOROLAC TROMETHAMINE 15 MG/ML IJ SOLN
15.0000 mg | Freq: Once | INTRAMUSCULAR | Status: AC
  Administered 2024-06-28: 15 mg via INTRAVENOUS
  Filled 2024-06-28: qty 1

## 2024-06-29 ENCOUNTER — Encounter: Payer: Self-pay | Admitting: *Deleted

## 2024-07-17 ENCOUNTER — Ambulatory Visit: Admitting: Nurse Practitioner

## 2024-07-17 NOTE — Progress Notes (Unsigned)
 "    07/17/2024 Tammy Wilson [id number] [birth year]  Primary Gastroenterologist: Dr. Albertus    Chief Complaint:  History of Present Illness: Tammy Wilson. Igo is a 59 year old female with a past medical history of anxiety, hypothyroidism, knee stones, sarcoidosis, GERD, obesity s/p gastric sleeve surgery 05/2015, diverticulosis and hyperplastic colon polyps.  Status post hiatal hernia repair 08/2020. S/P thoracic discectomy/fusion 04/23/2024 complicated by a postoperative right pleural effusion with subcutaneous emphysema and small right basilar pneumothorax s/p chest tube placement requiring hospital admission with 11/1 - 05/03/2024.  She was last seen in office by Ellouise Console, PA-C 02/17/2024 due active GERD symptoms and to having recurrent dysphagia.  Pantoprazole  was increased from 40 mg once daily to twice daily and lifestyle modifications were reviewed.  A barium swallow study was completed 03/08/2024 which showed moderate esophageal dysmotility with moderate GERD.    Discussed the use of AI scribe software for clinical note transcription with the patient, who gave verbal consent to proceed.  History of Present Illness             Latest Ref Rng & Units 06/27/2024   10:21 PM 05/03/2024    2:04 AM 05/01/2024    3:20 AM  CBC  WBC 4.0 - 10.5 K/uL 6.7  6.7  5.5   Hemoglobin 12.0 - 15.0 g/dL 88.4  8.9  9.1   Hematocrit 36.0 - 46.0 % 36.4  27.9  28.8   Platelets 150 - 400 K/uL 312  477  423         Latest Ref Rng & Units 06/27/2024   10:21 PM 05/03/2024    2:04 AM 05/01/2024    3:20 AM  CMP  Glucose 70 - 99 mg/dL 76  91  73   BUN 6 - 20 mg/dL 25  16  9    Creatinine 0.44 - 1.00 mg/dL 9.24  9.36  9.31   Sodium 135 - 145 mmol/L 142  141  141   Potassium 3.5 - 5.1 mmol/L 4.2  3.7  4.0   Chloride 98 - 111 mmol/L 105  105  102   CO2 22 - 32 mmol/L 24  28  29    Calcium 8.9 - 10.3 mg/dL 8.8  7.9  8.3   Total Protein 6.5 - 8.1 g/dL 7.4     Total Bilirubin 0.0 - 1.2 mg/dL  0.2     Alkaline Phos 38 - 126 U/L 77     AST 15 - 41 U/L 41     ALT 0 - 44 U/L 24       Barium swallow study 03/08/2024:  FINDINGS: The patient swallowed the barium without difficulty. Rapid sequence imaging of the pharynx in the AP and lateral projections demonstrates no laryngeal penetration or focal mucosal abnormality.   There is moderate esophageal dysmotility with a decreased primary stripping wave and diffuse tertiary contractions. No significant residual hiatal hernia demonstrated. There is no evidence of focal stricture or mucosal ulceration. Contrast passes freely into the gastric pouch and proximal small bowel.   Gastroesophageal reflux to the level of the carina was noted with the water  siphon test. A 13 mm barium tablet was administered and passed after a short delay in the distal esophagus into the stomach.   Additional postsurgical changes noted from previous multilevel cervical fusion and thyroidectomy.   IMPRESSION: 1. Moderate esophageal dysmotility with moderate gastroesophageal reflux. 2. No recurrent hiatal hernia or focal mucosal abnormality identified. 3. Postsurgical changes as described.  GI PROCEDURES:  07/2023 Enteroscopy at [facility]: Normal esophagus.  Roux-en-Y gastrojejunostomy with gastrojejunal anastomosis characterized by healthy-appearing mucosa.  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  Prolapse of the pylorus into the duodenal bulb.  Normal jejunum.  Biopsies showed reactive gastropathy.  Negative for H. pylori.   04/2023 EGD by Dr. Albertus:  2 cm hiatal hernia.  Lower third of esophagus mildly dilated and torturous.  Lower esophageal sphincter and GE junction widely patent.  Evidence of Roux-en-Y gastrojejunostomy.  Normal gastric pouch.  Healthy appearing mucosa at the anastomosis.  Healthy mucosa throughout.  No biopsies.   04/2023 Colonoscopy by Dr. Albertus: 2 small 5 mm hyperplastic polyps removed.  Diverticulosis.  Medium internal  hemorrhoids.  10-year repeat (due 04/2033).  Current Medications, Allergies, Past Medical History, Past Surgical History, Family History and Social History were reviewed in [employer] record.   Review of Systems:   Constitutional: Negative for fever, sweats, chills or weight loss.  Respiratory: Negative for shortness of breath.   Cardiovascular: Negative for chest pain, palpitations and leg swelling.  Gastrointestinal: See HPI.  Musculoskeletal: Negative for back pain or muscle aches.  Neurological: Negative for dizziness, headaches or paresthesias.    Physical Exam: There were no vitals taken for this visit. General: in no acute distress. Head: Normocephalic and atraumatic. Eyes: No scleral icterus. Conjunctiva pink . Ears: Normal auditory acuity. Mouth: Dentition intact. No ulcers or lesions.  Lungs: Clear throughout to auscultation. Heart: Regular rate and rhythm, no murmur. Abdomen: Soft, nontender and nondistended. No masses or hepatomegaly. Normal bowel sounds x 4 quadrants.  Rectal: *** Musculoskeletal: Symmetrical with no gross deformities. Extremities: No edema. Neurological: Alert oriented x 4. No focal deficits.  Psychological: Alert and cooperative. Normal mood and affect  Assessment and Recommendations: ***  "

## 2024-07-19 ENCOUNTER — Other Ambulatory Visit: Payer: Self-pay | Admitting: Physician Assistant

## 2024-07-20 ENCOUNTER — Other Ambulatory Visit: Payer: Self-pay | Admitting: Physician Assistant

## 2024-07-20 ENCOUNTER — Other Ambulatory Visit: Payer: Self-pay

## 2024-07-20 ENCOUNTER — Telehealth: Payer: Self-pay

## 2024-07-20 DIAGNOSIS — K219 Gastro-esophageal reflux disease without esophagitis: Secondary | ICD-10-CM

## 2024-07-20 MED ORDER — PANTOPRAZOLE SODIUM 40 MG PO TBEC: 40.0000 mg | DELAYED_RELEASE_TABLET | Freq: Two times a day (BID) | ORAL | 6 refills | Status: AC

## 2024-07-20 NOTE — Telephone Encounter (Signed)
 Is it ok to send refill for Pantoprazole  40mg  2x daily

## 2024-07-20 NOTE — Progress Notes (Signed)
 I refilled Pantoprazole  40mg  BID, #60, 6 RF. Rx sent to pharmacy. Ellouise Console, PA-C

## 2024-08-16 ENCOUNTER — Ambulatory Visit: Admitting: Physician Assistant

## 2024-08-24 ENCOUNTER — Ambulatory Visit: Admitting: Physician Assistant
